# Patient Record
Sex: Male | Born: 1975 | Race: White | Hispanic: No | Marital: Single | Smoking: Current every day smoker
Health system: Southern US, Community
[De-identification: ages and names within clinical notes are randomized; demographics above are authoritative.]

## PROBLEM LIST (undated history)

## (undated) DIAGNOSIS — F1010 Alcohol abuse, uncomplicated: Secondary | ICD-10-CM

## (undated) DIAGNOSIS — F419 Anxiety disorder, unspecified: Secondary | ICD-10-CM

## (undated) DIAGNOSIS — E785 Hyperlipidemia, unspecified: Secondary | ICD-10-CM

## (undated) DIAGNOSIS — F32A Depression, unspecified: Secondary | ICD-10-CM

## (undated) DIAGNOSIS — G8929 Other chronic pain: Secondary | ICD-10-CM

## (undated) DIAGNOSIS — E669 Obesity, unspecified: Secondary | ICD-10-CM

## (undated) DIAGNOSIS — M1990 Unspecified osteoarthritis, unspecified site: Secondary | ICD-10-CM

## (undated) DIAGNOSIS — E119 Type 2 diabetes mellitus without complications: Secondary | ICD-10-CM

## (undated) DIAGNOSIS — K5900 Constipation, unspecified: Secondary | ICD-10-CM

## (undated) DIAGNOSIS — I10 Essential (primary) hypertension: Secondary | ICD-10-CM

## (undated) DIAGNOSIS — F319 Bipolar disorder, unspecified: Secondary | ICD-10-CM

## (undated) DIAGNOSIS — R51 Headache: Secondary | ICD-10-CM

## (undated) HISTORY — PX: NECK SURGERY: SHX720

## (undated) HISTORY — DX: Depression, unspecified: F32.A

## (undated) HISTORY — DX: Bipolar disorder, unspecified: F31.9

## (undated) HISTORY — DX: Constipation, unspecified: K59.00

## (undated) HISTORY — DX: Obesity, unspecified: E66.9

## (undated) HISTORY — DX: Headache: R51

## (undated) HISTORY — DX: Essential (primary) hypertension: I10

## (undated) HISTORY — DX: Alcohol abuse, uncomplicated: F10.10

## (undated) HISTORY — DX: Unspecified osteoarthritis, unspecified site: M19.90

## (undated) HISTORY — DX: Anxiety disorder, unspecified: F41.9

## (undated) HISTORY — DX: Hyperlipidemia, unspecified: E78.5

## (undated) HISTORY — DX: Other chronic pain: G89.29

---

## 1998-04-29 ENCOUNTER — Emergency Department (HOSPITAL_COMMUNITY): Admission: EM | Admit: 1998-04-29 | Discharge: 1998-04-29 | Payer: Self-pay | Admitting: Emergency Medicine

## 1998-06-17 ENCOUNTER — Encounter: Payer: Self-pay | Admitting: Emergency Medicine

## 1998-06-17 ENCOUNTER — Emergency Department (HOSPITAL_COMMUNITY): Admission: EM | Admit: 1998-06-17 | Discharge: 1998-06-17 | Payer: Self-pay | Admitting: Emergency Medicine

## 1998-06-21 ENCOUNTER — Emergency Department (HOSPITAL_COMMUNITY): Admission: EM | Admit: 1998-06-21 | Discharge: 1998-06-21 | Payer: Self-pay | Admitting: Emergency Medicine

## 1998-06-23 ENCOUNTER — Emergency Department (HOSPITAL_COMMUNITY): Admission: EM | Admit: 1998-06-23 | Discharge: 1998-06-23 | Payer: Self-pay | Admitting: Emergency Medicine

## 2001-04-16 ENCOUNTER — Emergency Department (HOSPITAL_COMMUNITY): Admission: EM | Admit: 2001-04-16 | Discharge: 2001-04-16 | Payer: Self-pay

## 2005-10-31 ENCOUNTER — Emergency Department: Payer: Self-pay | Admitting: General Practice

## 2006-06-30 ENCOUNTER — Ambulatory Visit: Payer: Self-pay | Admitting: Family Medicine

## 2006-06-30 LAB — CONVERTED CEMR LAB
ALT: 50 units/L — ABNORMAL HIGH (ref 0–40)
AST: 29 units/L (ref 0–37)
Albumin: 4.3 g/dL (ref 3.5–5.2)
Alkaline Phosphatase: 74 units/L (ref 39–117)
BUN: 9 mg/dL (ref 6–23)
CO2: 32 meq/L (ref 19–32)
Calcium: 10.2 mg/dL (ref 8.4–10.5)
Chloride: 101 meq/L (ref 96–112)
Creatinine, Ser: 0.9 mg/dL (ref 0.4–1.5)
GFR calc non Af Amer: 106 mL/min
Glomerular Filtration Rate, Af Am: 128 mL/min/{1.73_m2}
Glucose, Bld: 120 mg/dL — ABNORMAL HIGH (ref 70–99)
HCT: 52.9 % — ABNORMAL HIGH (ref 39.0–52.0)
Hemoglobin: 17.5 g/dL — ABNORMAL HIGH (ref 13.0–17.0)
MCHC: 33.2 g/dL (ref 30.0–36.0)
MCV: 98.9 fL (ref 78.0–100.0)
Platelets: 176 10*3/uL (ref 150–400)
Potassium: 5 meq/L (ref 3.5–5.1)
RBC: 5.35 M/uL (ref 4.22–5.81)
RDW: 12.1 % (ref 11.5–14.6)
Sodium: 140 meq/L (ref 135–145)
TSH: 1.87 microintl units/mL (ref 0.35–5.50)
Total Bilirubin: 0.6 mg/dL (ref 0.3–1.2)
Total Protein: 7.8 g/dL (ref 6.0–8.3)
WBC: 6.8 10*3/uL (ref 4.5–10.5)

## 2006-07-30 ENCOUNTER — Ambulatory Visit: Payer: Self-pay | Admitting: Family Medicine

## 2007-01-05 ENCOUNTER — Emergency Department (HOSPITAL_COMMUNITY): Admission: EM | Admit: 2007-01-05 | Discharge: 2007-01-05 | Payer: Self-pay | Admitting: Emergency Medicine

## 2009-09-22 ENCOUNTER — Telehealth: Payer: Self-pay | Admitting: Family Medicine

## 2009-11-20 ENCOUNTER — Ambulatory Visit: Payer: Self-pay | Admitting: Family Medicine

## 2009-11-20 DIAGNOSIS — R519 Headache, unspecified: Secondary | ICD-10-CM | POA: Insufficient documentation

## 2009-11-20 DIAGNOSIS — R51 Headache: Secondary | ICD-10-CM

## 2009-11-20 DIAGNOSIS — F1010 Alcohol abuse, uncomplicated: Secondary | ICD-10-CM | POA: Insufficient documentation

## 2009-11-20 DIAGNOSIS — F329 Major depressive disorder, single episode, unspecified: Secondary | ICD-10-CM

## 2009-11-20 DIAGNOSIS — L708 Other acne: Secondary | ICD-10-CM | POA: Insufficient documentation

## 2009-11-20 DIAGNOSIS — I10 Essential (primary) hypertension: Secondary | ICD-10-CM | POA: Insufficient documentation

## 2009-11-20 LAB — CONVERTED CEMR LAB
ALT: 42 units/L (ref 0–53)
AST: 33 units/L (ref 0–37)
Albumin: 4.1 g/dL (ref 3.5–5.2)
Alkaline Phosphatase: 123 units/L — ABNORMAL HIGH (ref 39–117)
BUN: 11 mg/dL (ref 6–23)
Basophils Absolute: 0 10*3/uL (ref 0.0–0.1)
Basophils Relative: 0.3 % (ref 0.0–3.0)
Bilirubin, Direct: 0 mg/dL (ref 0.0–0.3)
CO2: 26 meq/L (ref 19–32)
Calcium: 9.9 mg/dL (ref 8.4–10.5)
Chloride: 99 meq/L (ref 96–112)
Creatinine, Ser: 0.8 mg/dL (ref 0.4–1.5)
Eosinophils Absolute: 0.2 10*3/uL (ref 0.0–0.7)
Eosinophils Relative: 2 % (ref 0.0–5.0)
GFR calc non Af Amer: 118.71 mL/min (ref >60)
Glucose, Bld: 293 mg/dL — ABNORMAL HIGH (ref 70–99)
HCT: 53.3 % — ABNORMAL HIGH (ref 39.0–52.0)
Hemoglobin: 18.3 g/dL — ABNORMAL HIGH (ref 13.0–17.0)
Lymphocytes Relative: 20.8 % (ref 12.0–46.0)
Lymphs Abs: 1.8 10*3/uL (ref 0.7–4.0)
MCHC: 34.2 g/dL (ref 30.0–36.0)
MCV: 100.9 fL — ABNORMAL HIGH (ref 78.0–100.0)
Monocytes Absolute: 0.6 10*3/uL (ref 0.1–1.0)
Monocytes Relative: 7 % (ref 3.0–12.0)
Neutro Abs: 6 10*3/uL (ref 1.4–7.7)
Neutrophils Relative %: 69.9 % (ref 43.0–77.0)
Platelets: 186 10*3/uL (ref 150.0–400.0)
Potassium: 4.2 meq/L (ref 3.5–5.1)
RBC: 5.29 M/uL (ref 4.22–5.81)
RDW: 12.2 % (ref 11.5–14.6)
Sodium: 134 meq/L — ABNORMAL LOW (ref 135–145)
TSH: 2.05 microintl units/mL (ref 0.35–5.50)
Total Bilirubin: 0.4 mg/dL (ref 0.3–1.2)
Total Protein: 8.3 g/dL (ref 6.0–8.3)
WBC: 8.6 10*3/uL (ref 4.5–10.5)

## 2009-11-21 ENCOUNTER — Telehealth (INDEPENDENT_AMBULATORY_CARE_PROVIDER_SITE_OTHER): Payer: Self-pay

## 2009-11-22 LAB — CONVERTED CEMR LAB: Hgb A1c MFr Bld: 7.3 % — ABNORMAL HIGH

## 2009-12-19 ENCOUNTER — Ambulatory Visit: Payer: Self-pay | Admitting: Family Medicine

## 2009-12-22 ENCOUNTER — Ambulatory Visit: Payer: Self-pay | Admitting: Family Medicine

## 2009-12-22 ENCOUNTER — Telehealth (INDEPENDENT_AMBULATORY_CARE_PROVIDER_SITE_OTHER): Payer: Self-pay | Admitting: *Deleted

## 2010-09-25 NOTE — Assessment & Plan Note (Signed)
Summary: PT TO RE-EST/CJR   Vital Signs:  Patient profile:   35 year old male Height:      70 inches Weight:      269 pounds BMI:     38.74 Temp:     98.4 degrees F oral Pulse rate:   108 / minute Pulse rhythm:   regular BP sitting:   160 / 110  (left arm) Cuff size:   large  Vitals Entered By: Raechel Ache, RN (November 20, 2009 1:47 PM) CC: Re-establish. C/o LBP which radiates down L leg.   History of Present Illness: 35 yr old male to re-establish with [place] after an absence of 3 1/2 years. He is here with his father. He has been seeing the [facility] for years for depression, and they have been saying he needs blood work because he he has a hx of elevated liver enzymes. In fact the one time we checked these in 2007 his ALT was up slightly at 50, and his AST was normal at 29. Also we had started him on meds for HTN 3 years ago, but he never followed up with [place]. he says his BP has been consistently high at the [facility] also. In general he feels fine, no chest pains or SOB. He has used alcohol heavily over the years, and when I asked him about this today he says he has cut back dramatically over the past month. he has cut back to averaging 4 beers a day from averaging 12 beers a day one month ago.   Preventive Screening-Counseling & Management  Alcohol-Tobacco     Smoking Status: current     Packs/Day: 1.0  Caffeine-Diet-Exercise     Does Patient Exercise: no      Drug Use:  no.    Allergies (verified): No Known Drug Allergies  Past History:  Past Medical History: bipolar disorder Headache Hypertension  Past Surgical History: Denies surgical history  Family History: Reviewed history and no changes required. Family History Hypertension Family History Lung cancer Family History Depression  Social History: Reviewed history and no changes required. Occupation: Personnel officer Single Current Smoker Alcohol use-yes Drug use-no Regular exercise-no Smoking  Status:  current Packs/Day:  1.0 Occupation:  employed Drug Use:  no Does Patient Exercise:  no  Review of Systems  The patient denies anorexia, fever, weight loss, weight gain, vision loss, decreased hearing, hoarseness, chest pain, syncope, dyspnea on exertion, peripheral edema, prolonged cough, headaches, hemoptysis, abdominal pain, melena, hematochezia, severe indigestion/heartburn, hematuria, incontinence, genital sores, muscle weakness, suspicious skin lesions, transient blindness, difficulty walking, unusual weight change, abnormal bleeding, enlarged lymph nodes, angioedema, breast masses, and testicular masses.    Physical Exam  General:  overweight-appearing.   Head:  Normocephalic and atraumatic without obvious abnormalities. No apparent alopecia or balding. Eyes:  No corneal or conjunctival inflammation noted. EOMI. Perrla. Funduscopic exam benign, without hemorrhages, exudates or papilledema. Vision grossly normal. Ears:  External ear exam shows no significant lesions or deformities.  Otoscopic examination reveals clear canals, tympanic membranes are intact bilaterally without bulging, retraction, inflammation or discharge. Hearing is grossly normal bilaterally. Nose:  External nasal examination shows no deformity or inflammation. Nasal mucosa are pink and moist without lesions or exudates. Mouth:  Oral mucosa and oropharynx without lesions or exudates.  Teeth in good repair. Neck:  No deformities, masses, or tenderness noted. Lungs:  Normal respiratory effort, chest expands symmetrically. Lungs are clear to auscultation, no crackles or wheezes. Heart:  Normal rate and regular  rhythm. S1 and S2 normal without gallop, murmur, click, rub or other extra sounds. Psych:  Oriented X3, memory intact for recent and remote, normally interactive, good eye contact, and subdued.     Impression & Recommendations:  Problem # 1:  HYPERTENSION (ICD-401.9)  His updated medication list for this  problem includes:    Metoprolol Succinate 50 Mg Xr24h-tab (Metoprolol succinate) ..... Once daily  Orders: UA Dipstick w/o Micro (automated)  (81003) Venipuncture (16109) TLB-BMP (Basic Metabolic Panel-BMET) (80048-METABOL) TLB-CBC Platelet - w/Differential (85025-CBCD) TLB-Hepatic/Liver Function Pnl (80076-HEPATIC) TLB-TSH (Thyroid Stimulating Hormone) (84443-TSH)  Problem # 2:  DEPRESSION (ICD-311)  His updated medication list for this problem includes:    Cymbalta 60 Mg Cpep (Duloxetine hcl) .Marland Kitchen... 1 once daily  Problem # 3:  ACNE, CYSTIC (ICD-706.1)  His updated medication list for this problem includes:    Tetracycline Hcl 500 Mg Caps (Tetracycline hcl) .Marland Kitchen... 1 two times a day  Problem # 4:  HEADACHE (ICD-784.0)  His updated medication list for this problem includes:    Metoprolol Succinate 50 Mg Xr24h-tab (Metoprolol succinate) ..... Once daily  Complete Medication List: 1)  Tetracycline Hcl 500 Mg Caps (Tetracycline hcl) .Marland Kitchen.. 1 two times a day 2)  Zyprexa 5 Mg Tabs (Olanzapine) .Marland Kitchen.. 1 once daily 3)  Cymbalta 60 Mg Cpep (Duloxetine hcl) .Marland Kitchen.. 1 once daily 4)  Metoprolol Succinate 50 Mg Xr24h-tab (Metoprolol succinate) .... Once daily  Patient Instructions: 1)  It is important that you exercise reguarly at least 20 minutes 5 times a week. If you develop chest pain, have severe difficulty breathing, or feel very tired, stop exercising immediately and seek medical attention.  2)  You need to lose weight. Consider a lower calorie diet and regular exercise.  3)  We spoke at length about the effects of heavy alcohol use on his body, and I advised him to stop completely. 4)  We will check labs today, and if his liver enzymes are elevated, we can relay this to his psychiatrists.  5)  Start treating the HTN as above.  6)  Please schedule a follow-up appointment in 1 month.  Prescriptions: METOPROLOL SUCCINATE 50 MG XR24H-TAB (METOPROLOL SUCCINATE) once daily  #30 x 5   Entered and  Authorized by:   Nelwyn Salisbury MD   Signed by:   Nelwyn Salisbury MD on 11/20/2009   Method used:   Electronically to        [facility] [phone][facility] (retail)       [address] [place], [place]  [address]       Ph: [phone] or [phone]       Fax: [phone]   RxID:   [id number]   Appended Document: PT TO RE-EST/CJR  Laboratory Results   Urine Tests    Routine Urinalysis   Color: yellow Appearance: Clear Glucose: 2+   (Normal Range: Negative) Bilirubin: negative   (Normal Range: Negative) Ketone: negative   (Normal Range: Negative) Spec. Gravity: 1.020   (Normal Range: 1.003-1.035) Blood: negative   (Normal Range: Negative) Protein: 1+   (Normal Range: Negative) Urobilinogen: 0.2   (Normal Range: 0-1) Nitrite: negative   (Normal Range: Negative) Leukocyte Esterace: negative   (Normal Range: Negative)    Comments: Rita Ohara  November 20, 2009 3:54 PM

## 2010-09-25 NOTE — Progress Notes (Signed)
----   Converted from flag ---- ---- 11/21/2009 8:58 AM, Rita Ohara wrote: DONE.  ---- 11/21/2009 8:26 AM, Raechel Ache, RN wrote: please add A1C thanks ------------------------------

## 2010-09-25 NOTE — Progress Notes (Signed)
Summary: no show  Phone Note Other Incoming   Summary of Call: no show for appt. Initial call taken by: Raechel Ache, RN,  December 22, 2009 9:11 AM  Follow-up for Phone Call        charge the NS fee Follow-up by: Nelwyn Salisbury MD,  December 22, 2009 4:06 PM  Additional Follow-up for Phone Call Additional follow up Details #1::        Billed no show.[name] [name]  Jan 15, 2010 9:22 AM

## 2010-09-25 NOTE — Progress Notes (Signed)
Summary: migraine ha/not seen since 07-30-2006  Phone Note Call from Patient Call back at Home Phone [phone] Call back at [phone]   Caller: Dad-wayne Call For: dr Bleu Minerd Summary of Call: pt has not seen  dr Tykeria Wawrzyniak since 07-30-2006 he is having migraine type ha can i use sda 09-25-2008 pt works out of town [employer]. Initial call taken by: Heron Sabins,  September 22, 2009 3:43 PM  Follow-up for Phone Call        No do not put him in on Monday, this is a busy hectic day already. This is a difficult and complicated pt. who will take a lot of time. One day later in the week would be okay Follow-up by: Nelwyn Salisbury MD,  September 22, 2009 5:20 PM  Additional Follow-up for Phone Call Additional follow up Details #1::        thanks pt is sch for 09-27-2009 3.15pm Additional Follow-up by: Heron Sabins,  September 22, 2009 5:33 PM

## 2011-01-11 NOTE — Assessment & Plan Note (Signed)
[facility]                                 ON-CALL NOTE   [name] [name], [name] [name]                         MRN:          [id number]  DATE:09/24/2006                            DOB:          [birth year]    Phone number [phone].   Father calls.  Patient of Dr. Claris Che, is on Cymbalta 120 mg a day.  Because of financial considerations with no insurance, they have run  out, usually gets samples.  A prescription was supposed to be called  into [facility], but [facility] is now closed and he has been off of his medicine  1 day and does not feel very well, and could we call in some medicine.  I have called in #10 Cymbalta 60 mg take 2 p.o. q. day [facility] [address] in [place], [phone].  The will call or follow up with  Dr. Clent Ridges tomorrow about more medication.     Neta Mends. Panosh, MD  Electronically Signed    WKP/MedQ  DD: 09/25/2006  DT: 09/25/2006  Job #: [id number]

## 2011-01-11 NOTE — Assessment & Plan Note (Signed)
[facility] OFFICE NOTE   [name] [name], [name] [name]                   MRN:          [id number]  DATE:06/30/2006                            DOB:          Aug 14, 1976    This is a 35 year old gentleman here to establish with our practice.  He is  accompanied by his father who is already a patient of mine.  [name] [name] has not  had a primary care physician in quite a number of years.  He is employed but  has no insurance, so this has limited his medical followup in the past.  He  has been seeing [facility] for some years for treatment  of depression but feels that his current regimen is no longer working.  About 8-9 years ago, he was placed on Paxil for depression, and it never  really helped him much.  Then about 7 years ago, he was placed on Effexor.  At first, this seemed to help, but over the years, his dose has gradually  been increased more and more to the point that he is now taking a very high  dose but still having a lot of difficulties.  He states that there are times  that he gets very depressed, very sad, and often will disappear and have no  social interaction with his family for weeks at a time.  He does deny  suicidal ideations fortunately.  There are other times when he becomes very  irritable.  He cannot sit still. He has trouble sleeping.  He states that he  has a problem with managing his anger, and his father corroborates this  entire history.  One problem with such a high dose of Effexor is that if he  misses a single day's dose, he feels very bad with withdrawal types of  symptoms.  He does typically have trouble sleeping at night.  He also has  had frequent headaches and a couple of nose bleeds over the past few months.  He thinks his blood pressure is creeping up.  He does have a family history  of it.   OTHER PAST MEDICAL HISTORY:  Fairly unremarkable.  No significant  illnesses.  He has never had a surgery.   ALLERGIES:  None.   CURRENT MEDICATIONS:  Effexor, a total of 375 mg per day.  (He takes five 75  mg capsules a day).   HABITS:  He smokes one pack per day of cigarettes.  He also drinks alcohol  fairly frequently.  He averages drinking beers 2-3 days a week, and when he  does drink, he typically has anywhere from 4-10 beers at a time.  He denies  any illicit drugs.   SOCIAL HISTORY:  He is single.  He is an Personnel officer.   FAMILY HISTORY:  Remarkable for depression, diabetes, and hypertension.   OBJECTIVE:  VITAL SIGNS:  Height 5 feet 10 inches, weight 258.  Blood  pressure 152/104, pulse 80 and regular.  GENERAL:  He is somewhat overweight.  NECK:  Supple without lymphadenopathy or masses.  LUNGS: Clear.  CARDIAC:  Regular rate and rhythm without gallops, murmurs, or rubs.  EXTREMITIES: No edema.  NEUROLOGIC: He does seem somewhat anxious today, but it is appropriate.   ASSESSMENT AND PLAN:  1. Probable bipolar disorder.  Will switch from Effexor to Cymbalta and      begin at 90 mg per day.  I gave him samples of 30 mg pills and 60 mg      pills to take over the next month. As a mood stabilizer, will add      Tegretol 200 mg to take 2 tablets per day.  I do anticipate titrating      the dose of this up over time, however.  Asked to see him back in 3      weeks for a followup visit.  We will stop the Effexor completely and      switch to these 2 new medications as of today.  2. Hypertension.  I gave him samples to begin AZOR 5/20 to take once a      day.  Will check lab work today and will follow up his blood pressure      in 3 weeks as well.    ______________________________  Tera Mater. Clent Ridges, MD    SAF/MedQ  DD: 06/30/2006  DT: 06/30/2006  Job #: [id number]

## 2011-02-22 ENCOUNTER — Emergency Department: Payer: Self-pay | Admitting: Emergency Medicine

## 2011-03-03 ENCOUNTER — Emergency Department (HOSPITAL_COMMUNITY)
Admission: EM | Admit: 2011-03-03 | Discharge: 2011-03-03 | Disposition: A | Discharge status: Home / Self Care | Payer: Self-pay | Attending: Emergency Medicine | Admitting: Emergency Medicine

## 2011-03-03 ENCOUNTER — Emergency Department (HOSPITAL_COMMUNITY): Payer: Self-pay

## 2011-03-03 DIAGNOSIS — S81009A Unspecified open wound, unspecified knee, initial encounter: Secondary | ICD-10-CM | POA: Insufficient documentation

## 2011-03-03 DIAGNOSIS — X58XXXA Exposure to other specified factors, initial encounter: Secondary | ICD-10-CM | POA: Insufficient documentation

## 2011-03-03 DIAGNOSIS — M542 Cervicalgia: Secondary | ICD-10-CM | POA: Insufficient documentation

## 2011-03-03 DIAGNOSIS — S91009A Unspecified open wound, unspecified ankle, initial encounter: Secondary | ICD-10-CM | POA: Insufficient documentation

## 2011-03-03 DIAGNOSIS — R51 Headache: Secondary | ICD-10-CM | POA: Insufficient documentation

## 2011-03-03 DIAGNOSIS — R509 Fever, unspecified: Secondary | ICD-10-CM | POA: Insufficient documentation

## 2011-03-03 DIAGNOSIS — F411 Generalized anxiety disorder: Secondary | ICD-10-CM | POA: Insufficient documentation

## 2011-03-04 ENCOUNTER — Telehealth: Payer: Self-pay | Admitting: *Deleted

## 2011-03-04 NOTE — Telephone Encounter (Signed)
[facility] Triage Call Report Triage Record Num: [id number] Operator: Thayer Headings Patient Name: Coulter Oldaker Call Date & Time: 03/03/2011 2:35:54PM Patient Phone: [phone] PCP: Patient Gender: Male PCP Fax : Patient DOB: 1975-10-18 Practice Name: [facility] Reason for Call: Pain in the left arm with fever and vomiting and the caller, dad states the patient had a spider bite 2 weeks ago with has caused him to be bed ridden. No triage. Dad said the only reason he was calling was so Dr. Abran Cantor could meet him at the ED. Advised caller that the pt would need to be seen in ED and the ED doctor would call Dr. Abran Cantor if he needed to, asked Dad he if would like for the pt to be triaged to see if he needs to go to ED. Dad refused. THE PATIENT REFUSED 911 Protocol(s) Used: Office Note Recommended Outcome per Protocol: Information Noted and Sent to Office Reason for Outcome: Caller information to office Care Advice: ~ 03/03/2011 2:42:15PM Page 1 of 1 [email or web address]

## 2011-03-04 NOTE — Telephone Encounter (Signed)
Left a voice message, I was calling to follow up to find out if pt did get treatment. I explained that the doctor is out of town and will be out of the office all week.Marland Kitchen

## 2011-10-03 ENCOUNTER — Encounter (HOSPITAL_COMMUNITY): Payer: Self-pay | Admitting: Emergency Medicine

## 2011-10-03 ENCOUNTER — Emergency Department (HOSPITAL_COMMUNITY)
Admission: EM | Admit: 2011-10-03 | Discharge: 2011-10-03 | Disposition: A | Discharge status: Home / Self Care | Payer: Self-pay | Attending: Emergency Medicine | Admitting: Emergency Medicine

## 2011-10-03 DIAGNOSIS — I10 Essential (primary) hypertension: Secondary | ICD-10-CM | POA: Insufficient documentation

## 2011-10-03 DIAGNOSIS — R7309 Other abnormal glucose: Secondary | ICD-10-CM | POA: Insufficient documentation

## 2011-10-03 DIAGNOSIS — R350 Frequency of micturition: Secondary | ICD-10-CM | POA: Insufficient documentation

## 2011-10-03 DIAGNOSIS — R739 Hyperglycemia, unspecified: Secondary | ICD-10-CM

## 2011-10-03 DIAGNOSIS — R209 Unspecified disturbances of skin sensation: Secondary | ICD-10-CM | POA: Insufficient documentation

## 2011-10-03 LAB — URINALYSIS, ROUTINE W REFLEX MICROSCOPIC
Leukocytes, UA: NEGATIVE
Nitrite: NEGATIVE
Specific Gravity, Urine: 1.029 (ref 1.005–1.030)
Urobilinogen, UA: 1 mg/dL (ref 0.0–1.0)
pH: 6.5 (ref 5.0–8.0)

## 2011-10-03 LAB — CBC
Hemoglobin: 18.9 g/dL — ABNORMAL HIGH (ref 13.0–17.0)
Platelets: 130 10*3/uL — ABNORMAL LOW (ref 150–400)
RBC: 5.33 MIL/uL (ref 4.22–5.81)
WBC: 7.8 10*3/uL (ref 4.0–10.5)

## 2011-10-03 LAB — DIFFERENTIAL
Lymphocytes Relative: 20 % (ref 12–46)
Lymphs Abs: 1.5 10*3/uL (ref 0.7–4.0)
Monocytes Relative: 9 % (ref 3–12)
Neutrophils Relative %: 69 % (ref 43–77)

## 2011-10-03 LAB — BASIC METABOLIC PANEL
CO2: 26 mEq/L (ref 19–32)
Calcium: 9.9 mg/dL (ref 8.4–10.5)
GFR calc non Af Amer: >90 mL/min (ref >90)
Glucose, Bld: 273 mg/dL — ABNORMAL HIGH (ref 70–99)
Potassium: 4.3 mEq/L (ref 3.5–5.1)
Sodium: 134 mEq/L — ABNORMAL LOW (ref 135–145)

## 2011-10-03 LAB — GLUCOSE, CAPILLARY: Glucose-Capillary: 243 mg/dL — ABNORMAL HIGH (ref 70–99)

## 2011-10-03 LAB — HEMOGLOBIN A1C
Hgb A1c MFr Bld: 8.5 % — ABNORMAL HIGH (ref <5.7)
Mean Plasma Glucose: 197 mg/dL — ABNORMAL HIGH (ref <117)

## 2011-10-03 LAB — URINE MICROSCOPIC-ADD ON

## 2011-10-03 MED ORDER — SODIUM CHLORIDE 0.9 % IV BOLUS (SEPSIS)
1000.0000 mL | Freq: Once | INTRAVENOUS | Status: AC
  Administered 2011-10-03: 1000 mL via INTRAVENOUS

## 2011-10-03 NOTE — ED Notes (Signed)
Pt c/o blurry vision, hand numbness and increased thirst x several weeks; pt sts bought CBG machine and has elevated CBG around 225 or higher; pt with no hx of DM

## 2011-10-03 NOTE — ED Notes (Signed)
CBG 243 

## 2011-10-03 NOTE — ED Provider Notes (Signed)
History     CSN: [id number]  Arrival date & time 10/03/11  1009   First MD Initiated Contact with Patient 10/03/11 1100      Chief Complaint  Patient presents with  . Hyperglycemia    (Consider location/radiation/quality/duration/timing/severity/associated sxs/prior treatment) HPI Comments: Patient presents emergency department with chief complaint of hyperglycemia.  Patient states that he has a home glucometer and has been checking his blood sugars finding and they have been greater than 200 every time.  Patient states in addition to this he been having some tingling of his hands and feet.  Patient is a current smoker.  Patient reports increasing urinary frequency, polydipsia, mildly blurred vision.  Patient denies nausea, vomiting, diarrhea, abdominal pain, extremity weakness or swelling, chest pain, shortness of breath.  The history is provided by the patient.    History reviewed. No pertinent past medical history.  History reviewed. No pertinent past surgical history.  History reviewed. No pertinent family history.  History  Substance Use Topics  . Smoking status: Current Everyday Smoker  . Smokeless tobacco: Not on file  . Alcohol Use: Yes      Review of Systems  Constitutional: Negative for fever, chills and appetite change.  HENT: Negative for congestion.   Eyes: Negative for visual disturbance.  Respiratory: Negative for shortness of breath.   Cardiovascular: Negative for chest pain and leg swelling.  Gastrointestinal: Negative for abdominal pain.  Genitourinary: Positive for frequency. Negative for dysuria, hematuria, flank pain, decreased urine volume, enuresis, difficulty urinating and penile pain.  Musculoskeletal: Negative for back pain.  Neurological: Negative for dizziness, syncope, weakness, light-headedness, numbness and headaches.  Psychiatric/Behavioral: Negative for confusion.  All other systems reviewed and are negative.    Allergies  Review of  patient's allergies indicates no known allergies.  Home Medications   Current Outpatient Rx  Name Route Sig Dispense Refill  . ACETAMINOPHEN 325 MG PO TABS Oral Take 650 mg by mouth every 6 (six) hours as needed. headaches    . ASPIRIN 325 MG PO TABS Oral Take 650 mg by mouth daily as needed. Head aches    . DULOXETINE HCL 30 MG PO CPEP Oral Take 30 mg by mouth 2 (two) times daily.      BP 136/75  Pulse 72  Temp(Src) 98 F (36.7 C) (Oral)  Resp 18  SpO2 99%  Physical Exam  Nursing note and vitals reviewed. Constitutional: He is oriented to person, place, and time. He appears well-developed and well-nourished. No distress.  HENT:  Head: Normocephalic and atraumatic.  Eyes: Conjunctivae and EOM are normal. Pupils are equal, round, and reactive to light. No scleral icterus.       Visual acuity 20/20 bilaterally via nurse.   Neck: Normal range of motion and full passive range of motion without pain. Neck supple. No JVD present. Carotid bruit is not present. No rigidity. No Brudzinski's sign noted.  Cardiovascular: Normal rate, regular rhythm, normal heart sounds and intact distal pulses.   Pulmonary/Chest: Effort normal and breath sounds normal. No respiratory distress. He has no wheezes. He has no rales.  Abdominal: Soft. Normal appearance and normal aorta.       No abdominal or CVA ttp. BS present and normal.   Musculoskeletal: Normal range of motion.  Lymphadenopathy:    He has no cervical adenopathy.  Neurological: He is alert and oriented to person, place, and time. He has normal strength. No cranial nerve deficit or sensory deficit. He displays a negative Romberg sign. Coordination  and gait normal. GCS eye subscore is 4. GCS verbal subscore is 5. GCS motor subscore is 6.       A&O x3.  PERRL, EOMs, no vertical or bidirectional nystagmus. Shoulder shrug, facial muscles, tongue protrusion and swallow intact.  Motor strength 5/5 bilaterally of extremities. Normal patellar DTRs.   Light touch intact in all 4 distal limbs. No ataxia or dysequilibrium.   Skin: Skin is warm and dry. No rash noted. He is not diaphoretic.  Psychiatric: He has a normal mood and affect. His behavior is normal.    ED Course  Procedures (including critical care time)  Labs Reviewed  GLUCOSE, CAPILLARY - Abnormal; Notable for the following:    Glucose-Capillary 243 (*)    All other components within normal limits  BASIC METABOLIC PANEL - Abnormal; Notable for the following:    Sodium 134 (*)    Glucose, Bld 273 (*)    All other components within normal limits  CBC - Abnormal; Notable for the following:    Hemoglobin 18.9 (*)    HCT 53.3 (*)    MCH 35.5 (*)    Platelets 130 (*)    All other components within normal limits  DIFFERENTIAL  HEMOGLOBIN A1C  URINALYSIS, ROUTINE W REFLEX MICROSCOPIC   No results found.   No diagnosis found.    MDM  Hyperglycemia/ Hypertension  Pt with mild ketonuria and BG of 273, however non concerning for DKA do to normal bicarb, no anion gap and the absence of GI s/s, muscle aches and respiratory distress.           Jaci Carrel, [place] 10/07/11 2354

## 2011-10-04 ENCOUNTER — Encounter: Payer: Self-pay | Admitting: Family Medicine

## 2011-10-04 ENCOUNTER — Ambulatory Visit: Payer: Self-pay | Admitting: Family Medicine

## 2011-10-04 ENCOUNTER — Ambulatory Visit (INDEPENDENT_AMBULATORY_CARE_PROVIDER_SITE_OTHER): Payer: Self-pay | Admitting: Family Medicine

## 2011-10-04 VITALS — BP 178/110 | HR 114 | Temp 98.5°F | Wt 233.0 lb

## 2011-10-04 DIAGNOSIS — R739 Hyperglycemia, unspecified: Secondary | ICD-10-CM

## 2011-10-04 DIAGNOSIS — R7309 Other abnormal glucose: Secondary | ICD-10-CM

## 2011-10-04 DIAGNOSIS — I10 Essential (primary) hypertension: Secondary | ICD-10-CM

## 2011-10-04 DIAGNOSIS — E119 Type 2 diabetes mellitus without complications: Secondary | ICD-10-CM

## 2011-10-04 MED ORDER — METFORMIN HCL 500 MG PO TABS: 500.0000 mg | ORAL_TABLET | Freq: Two times a day (BID) | ORAL | Status: DC

## 2011-10-04 MED ORDER — LISINOPRIL 10 MG PO TABS: 10.0000 mg | ORAL_TABLET | Freq: Every day | ORAL | Status: DC

## 2011-10-04 NOTE — Patient Instructions (Signed)

## 2011-10-04 NOTE — Progress Notes (Signed)
  Subjective:    Patient ID: [name] [name], male    DOB: 08/13/1976, 36 y.o.   MRN: [id number]  HPI  Patient here to evaluate elevated blood pressure and elevated blood sugar. No prior history of diabetes. He has recently developed some increased urine frequency and thirst along with mild weight loss. Also had some mild blurred vision and tingling feet and hands. Went to the emergency department with elevated blood sugar 220 yesterday. Patient had acquired a home glucose monitor and several readings between 225 and 331. Strong family history type 2 diabetes. No evidence for ketoacidosis based on recent hospital labs. Poor dietary compliance. No consistent exercise.  Hemoglobin A 1C 7.3% one year ago and 8.5% in emergency department.  Very high blood pressure in hospital. Patient has had generalized malaise for quite some time. Binges with alcohol sometimes up to 18 beers per day.  Was treated for hypertension several years ago with some medication which he did not tolerate. No allergic reaction but has felt more fatigued. Does not recall name of medication.   Review of Systems  Constitutional: Positive for unexpected weight change (weight loss). Negative for fatigue.  Eyes: Negative for visual disturbance.  Respiratory: Negative for cough, chest tightness and shortness of breath.   Cardiovascular: Negative for chest pain, palpitations and leg swelling.  Genitourinary: Positive for frequency.  Neurological: Negative for dizziness, syncope, weakness, light-headedness and headaches.       Objective:   Physical Exam  Constitutional: He is oriented to person, place, and time. He appears well-developed and well-nourished.  HENT:  Right Ear: External ear normal.  Left Ear: External ear normal.  Mouth/Throat: Oropharynx is clear and moist.  Neck: Neck supple. No thyromegaly present.  Cardiovascular: Normal rate and regular rhythm.   Pulmonary/Chest: Effort normal and breath sounds normal. No  respiratory distress. He has no wheezes. He has no rales.  Musculoskeletal: He exhibits no edema.  Lymphadenopathy:    He has no cervical adenopathy.  Neurological: He is alert and oriented to person, place, and time.  Psychiatric: He has a normal mood and affect. His behavior is normal.          Assessment & Plan:  #1 type 2 diabetes.   New onset and symptomatic. Start metformin 500 mg daily and if no side effects after one week titrate to 500 mg twice a day. Dietary factors discussed. Discussed diabetes educator but he has no insurance. Continue home glucose monitoring. Dietary information given #2 hypertension. Severe elevation and untreated. Scale back alcohol use. Start lisinopril 10 mg daily. Reassess with primary 3-4 weeks #3 alcohol abuse. Discussed importance of giving this up altogether if unable to reduce consumption.

## 2011-10-08 NOTE — ED Provider Notes (Signed)
Medical screening examination/treatment/procedure(s) were conducted as a shared visit with non-physician practitioner(s) and myself.  I personally evaluated the patient during the encounter  Ethelda Chick, MD 10/08/11 [phone]

## 2011-10-11 ENCOUNTER — Telehealth: Payer: Self-pay | Admitting: *Deleted

## 2011-10-11 NOTE — Telephone Encounter (Signed)
Try anti-inflammatories such as Aleve or ibuprofen. If that is not working he needs to be evaluated

## 2011-10-11 NOTE — Telephone Encounter (Signed)
Pt feels he is having a reaction to the Metformin as pain in muscles especiallly his neck, shoulders and arms.  No Tylenol or heat or ice is helping. Asking for Dr. Lucie Leather advice.

## 2011-10-11 NOTE — Telephone Encounter (Signed)
This is unlikely to be secondary to metformin.  I would try to stay the course unless he has severe pain.  If pain persists he would have to discontinue the metformin briefly to see if pain went away.

## 2011-10-11 NOTE — Telephone Encounter (Signed)
Pt is stating it is severe pain,and nothing he is using is helping????

## 2011-10-11 NOTE — Telephone Encounter (Signed)
Notified pt. 

## 2011-10-14 ENCOUNTER — Telehealth: Payer: Self-pay | Admitting: *Deleted

## 2011-10-14 NOTE — Telephone Encounter (Signed)
Pt is still having severe neck and shoulder pain, and is extremely nauseated.  Would like to go off the Metformin.

## 2011-10-14 NOTE — Telephone Encounter (Signed)
Stop metformin. Patient needs followup with Dr. Clent Ridges

## 2011-11-01 ENCOUNTER — Ambulatory Visit (INDEPENDENT_AMBULATORY_CARE_PROVIDER_SITE_OTHER): Payer: Self-pay | Admitting: Family Medicine

## 2011-11-01 ENCOUNTER — Encounter: Payer: Self-pay | Admitting: Family Medicine

## 2011-11-01 VITALS — BP 148/90 | HR 94 | Temp 98.2°F | Wt 228.0 lb

## 2011-11-01 DIAGNOSIS — I10 Essential (primary) hypertension: Secondary | ICD-10-CM

## 2011-11-01 DIAGNOSIS — E119 Type 2 diabetes mellitus without complications: Secondary | ICD-10-CM

## 2011-11-01 MED ORDER — GLIPIZIDE 10 MG PO TABS: 10.0000 mg | ORAL_TABLET | Freq: Two times a day (BID) | ORAL | Status: DC

## 2011-11-01 MED ORDER — LISINOPRIL 20 MG PO TABS: 20.0000 mg | ORAL_TABLET | Freq: Every day | ORAL | Status: DC

## 2011-11-01 NOTE — Progress Notes (Signed)
  Subjective:    Patient ID: [name] [name], male    DOB: 1976-03-25, 36 y.o.   MRN: [id number]  HPI Here to follow up on HTN and diabetes. He stopped taking Metformin due to nausea and muscle aches. He has made some dietary changes and cut back quite a bit on his intake of sugars and starches. He has cut back slightly on his alcohol use (from drinking 18 beers at a time 3-4 days a week to drinking 12 beers at a time). He is taking the Lisinopril. He feels better with more energy. His glucoses have been running from 140 to 200.    Review of Systems  Constitutional: Negative.   Respiratory: Negative.   Cardiovascular: Negative.        Objective:   Physical Exam  Constitutional: He appears well-developed and well-nourished.  Cardiovascular: Normal rate, regular rhythm, normal heart sounds and intact distal pulses.   Pulmonary/Chest: Effort normal and breath sounds normal.          Assessment & Plan:  We will start him on Glipizide bid. Increase Lisinopril dose.  Watch the diet. I urged him to continue decreasing his alcohol use. Recheck in 2 weeks.

## 2011-11-06 ENCOUNTER — Telehealth: Payer: Self-pay

## 2011-11-06 NOTE — Telephone Encounter (Signed)
Pt states he was just able to get his glipizide 10 mg that Dr. Clent Ridges just prescribed for pt.  Pt states he took the medication with a meal last night and his blood sugar dropped to 58.  Pt states he got dizzy and almost passed out.  This morning pt states his blood sugar was 160. Pt states all night his blood sugar did not go above 89. Pt just ate at noon and he states he took 1/2 tablet.  Pt would like to know what he needs to do about this.  Pls advise.

## 2011-11-06 NOTE — Telephone Encounter (Signed)
Stay with 1/2 tablet bid for now. Let us know how the glucoses are running in a week or two

## 2011-11-06 NOTE — Telephone Encounter (Signed)
Pt aware and states he will call back with glucose results.

## 2011-11-15 ENCOUNTER — Ambulatory Visit: Payer: Self-pay | Admitting: Family Medicine

## 2012-03-10 ENCOUNTER — Emergency Department: Payer: Self-pay | Admitting: Emergency Medicine

## 2012-04-25 ENCOUNTER — Emergency Department: Payer: Self-pay | Admitting: Emergency Medicine

## 2012-04-26 ENCOUNTER — Emergency Department: Payer: Self-pay | Admitting: Internal Medicine

## 2012-05-29 ENCOUNTER — Emergency Department: Payer: Self-pay | Admitting: Internal Medicine

## 2012-05-29 LAB — BASIC METABOLIC PANEL
BUN: 11 mg/dL (ref 7–18)
Calcium, Total: 9 mg/dL (ref 8.5–10.1)
EGFR (African American): >60
EGFR (Non-African Amer.): >60
Glucose: 273 mg/dL — ABNORMAL HIGH (ref 65–99)
Osmolality: 281 (ref 275–301)
Sodium: 136 mmol/L (ref 136–145)

## 2012-06-23 ENCOUNTER — Other Ambulatory Visit: Payer: Self-pay | Admitting: Family Medicine

## 2012-07-30 ENCOUNTER — Telehealth: Payer: Self-pay | Admitting: Family Medicine

## 2012-07-30 MED ORDER — LOSARTAN POTASSIUM-HCTZ 100-12.5 MG PO TABS: 1.0000 | ORAL_TABLET | Freq: Every day | ORAL | Status: DC

## 2012-07-30 NOTE — Telephone Encounter (Signed)
Per Dr. Kirtland Bouchard- highly unlikely that lisinopril is causing pt's hair to come out but a new medication will be called in since pt's bp is elevated.  Ok per to K to call in losartan 100-12.5 mg #30.  Pt is aware.

## 2012-07-30 NOTE — Telephone Encounter (Signed)
Called and spoke with pt and pt is aware Dr. Clent Ridges is out of the office until 12/6.  Pt would like to see if another physician would call in another bp medication.

## 2012-07-30 NOTE — Telephone Encounter (Signed)
Pts father called and said that his son called the [facility] and has not gotten a call back. Pt is having problems with bp and reading was 167/100. Pts hair is falling out of hair in beard, due to medication lisinopril (PRINIVIL,ZESTRIL) 20 MG tablet . Req call back from nurse asap. Pts father is aware that Dr Clent Ridges has already left for the day, but is still req call back from some one today. [facility] in [place], [place].

## 2012-07-30 NOTE — Telephone Encounter (Signed)
Patient Information:  Caller Name: Sabastian  Phone: [phone]  Patient: [name] [name], [name] [name]  Gender: Male  DOB: December 10, 1975  Age: 36 Years  PCP: N/A   Symptoms  Reason For Call & Symptoms: Patient LOV November 01, 2011.  Lisinopril, Glipizide  was started in March for elevated blood pressure and sugar.  He noticed that his facial hair has started coming out around September.  He stopped the lisinopril three days ago because he thinks its his blood pressure medication causing hair loss.   Pressure today 166/100. describes dizzy intermittently.  Reviewed Health History In EMR: Yes  Reviewed Medications In EMR: Yes  Reviewed Allergies In EMR: Yes  Reviewed Surgeries / Procedures: Yes  Date of Onset of Symptoms: 04/26/2012  Treatments Tried: Stopped Lisinopril x3 days  Treatments Tried Worked: No  Guideline(s) Used:  High Blood Pressure  Disposition Per Guideline:   See Within 2 Weeks in [facility]  Reason For Disposition Reached:   BP > 160/100  Advice Given:  BP less than 120 / 80   This is considered normal blood pressure  General:  Untreated high blood pressure may cause damage to the heart, brain, kidneys, and eyes.  Call Back If:  Headache, blurred vision, difficulty talking, or difficulty walking occurs  Chest pain or difficulty breathing occurs  You become worse.  How Much Sodium (Salt) Should You Eat Each Day?  Aim to eat less than 1,500 mg of sodium each day.  Remember that one teaspoon of salt has 2,300 mg of sodium.  Unfortunately, 75% of the salt in the average person's diet is in pre-processed foods.  Call Back If:  You have more questions.  Office Follow Up:  Does the office need to follow up with this patient?: Yes  Instructions For The Office: Patient is out of town. Does not have medication. Stopped Lisinopril due to facial hair loss. B/P 166/100  RN Note:  Patient is out of town in [place] . Does not have his lisinopril with him. Stopped medication because he  believes it is causing facial hair loss. B/P 166/100 . Pharmacy [phone]

## 2012-07-31 NOTE — Telephone Encounter (Signed)
I spoke with pt and he is going to schedule the office visit for next week.

## 2012-07-31 NOTE — Telephone Encounter (Signed)
I see a new med was called in for him. He needs to make an appt with me for next week

## 2012-08-04 ENCOUNTER — Ambulatory Visit (INDEPENDENT_AMBULATORY_CARE_PROVIDER_SITE_OTHER): Payer: Self-pay | Admitting: Family Medicine

## 2012-08-04 ENCOUNTER — Encounter: Payer: Self-pay | Admitting: Family Medicine

## 2012-08-04 VITALS — BP 142/98 | HR 85 | Temp 98.4°F | Wt 240.0 lb

## 2012-08-04 DIAGNOSIS — E119 Type 2 diabetes mellitus without complications: Secondary | ICD-10-CM

## 2012-08-04 DIAGNOSIS — I10 Essential (primary) hypertension: Secondary | ICD-10-CM

## 2012-08-04 LAB — HEPATIC FUNCTION PANEL
ALT: 32 U/L (ref 0–53)
AST: 22 U/L (ref 0–37)
Albumin: 4.1 g/dL (ref 3.5–5.2)
Alkaline Phosphatase: 89 U/L (ref 39–117)

## 2012-08-04 LAB — BASIC METABOLIC PANEL
BUN: 11 mg/dL (ref 6–23)
Calcium: 9.4 mg/dL (ref 8.4–10.5)
Chloride: 96 mEq/L (ref 96–112)
Creatinine, Ser: 0.8 mg/dL (ref 0.4–1.5)
GFR: 116.8 mL/min (ref >60.00)

## 2012-08-04 LAB — MICROALBUMIN / CREATININE URINE RATIO: Creatinine,U: 192.9 mg/dL

## 2012-08-04 LAB — LIPID PANEL
Cholesterol: 240 mg/dL — ABNORMAL HIGH (ref 0–200)
Total CHOL/HDL Ratio: 9

## 2012-08-04 MED ORDER — AMLODIPINE BESYLATE 5 MG PO TABS: 5.0000 mg | ORAL_TABLET | Freq: Every day | ORAL | Status: DC

## 2012-08-04 MED ORDER — GLYBURIDE 2.5 MG PO TABS: 2.5000 mg | ORAL_TABLET | Freq: Two times a day (BID) | ORAL | Status: DC

## 2012-08-04 NOTE — Progress Notes (Signed)
  Subjective:    Patient ID: [name] [name], male    DOB: 1976-07-04, 36 y.o.   MRN: [id number]  HPI Here to follow up HTN and DM. He had stopped Lisinopril because he thinks it was causing him to lose facial hair, and he started on Losartan HCT. He does not like this either because he feels lightheaded and dizzy, even though his BP remains quite elevated. He has had readings as high as 170/100 in the past few days. No HA or chest pain or SOB. His glucoses have been labile, jumping fropm the 60s to the 200s and back down again. He is very sensitive to Glipizide, and it causes his glucose to bottom out even if he only takes 1/4 of a 10 mg tablet. He cannot afford anything other than generic meds.    Review of Systems  Constitutional: Negative.   Respiratory: Negative.   Cardiovascular: Negative.   Neurological: Positive for dizziness and light-headedness. Negative for headaches.       Objective:   Physical Exam  Constitutional: He is oriented to person, place, and time. He appears well-developed and well-nourished.  Neck: No thyromegaly present.  Cardiovascular: Normal rate, regular rhythm, normal heart sounds and intact distal pulses.   Pulmonary/Chest: Effort normal.  Lymphadenopathy:    He has no cervical adenopathy.  Neurological: He is alert and oriented to person, place, and time.          Assessment & Plan:  Get fasting labs today. Switch to Glyburide 2.5 mg bid. Switch to Amlodipine. Recheck in 2-3 weeks

## 2012-08-10 NOTE — Progress Notes (Signed)
Quick Note:  I spoke with pt and he cannot take Metformin. He will be back here in the office in 2 weeks and will discuss new medication. ______

## 2012-08-11 ENCOUNTER — Emergency Department (HOSPITAL_COMMUNITY)
Admission: EM | Admit: 2012-08-11 | Discharge: 2012-08-11 | Disposition: A | Discharge status: Home / Self Care | Payer: Self-pay | Attending: Emergency Medicine | Admitting: Emergency Medicine

## 2012-08-11 ENCOUNTER — Encounter (HOSPITAL_COMMUNITY): Payer: Self-pay | Admitting: Emergency Medicine

## 2012-08-11 DIAGNOSIS — Z79899 Other long term (current) drug therapy: Secondary | ICD-10-CM | POA: Insufficient documentation

## 2012-08-11 DIAGNOSIS — I10 Essential (primary) hypertension: Secondary | ICD-10-CM | POA: Insufficient documentation

## 2012-08-11 DIAGNOSIS — F17200 Nicotine dependence, unspecified, uncomplicated: Secondary | ICD-10-CM | POA: Insufficient documentation

## 2012-08-11 DIAGNOSIS — F319 Bipolar disorder, unspecified: Secondary | ICD-10-CM | POA: Insufficient documentation

## 2012-08-11 DIAGNOSIS — R0789 Other chest pain: Secondary | ICD-10-CM | POA: Insufficient documentation

## 2012-08-11 DIAGNOSIS — R0602 Shortness of breath: Secondary | ICD-10-CM | POA: Insufficient documentation

## 2012-08-11 DIAGNOSIS — E119 Type 2 diabetes mellitus without complications: Secondary | ICD-10-CM | POA: Insufficient documentation

## 2012-08-11 DIAGNOSIS — F410 Panic disorder [episodic paroxysmal anxiety] without agoraphobia: Secondary | ICD-10-CM | POA: Insufficient documentation

## 2012-08-11 DIAGNOSIS — R42 Dizziness and giddiness: Secondary | ICD-10-CM | POA: Insufficient documentation

## 2012-08-11 DIAGNOSIS — R002 Palpitations: Secondary | ICD-10-CM | POA: Insufficient documentation

## 2012-08-11 HISTORY — DX: Type 2 diabetes mellitus without complications: E11.9

## 2012-08-11 LAB — POCT I-STAT, CHEM 8
Creatinine, Ser: 0.6 mg/dL (ref 0.50–1.35)
HCT: 56 % — ABNORMAL HIGH (ref 39.0–52.0)
Hemoglobin: 19 g/dL — ABNORMAL HIGH (ref 13.0–17.0)
Potassium: 4.3 mEq/L (ref 3.5–5.1)
Sodium: 138 mEq/L (ref 135–145)

## 2012-08-11 LAB — POCT I-STAT TROPONIN I

## 2012-08-11 MED ORDER — LORAZEPAM 1 MG PO TABS: 1.0000 mg | ORAL_TABLET | Freq: Three times a day (TID) | ORAL | Status: DC | PRN

## 2012-08-11 NOTE — ED Notes (Signed)
Pt states he has a history of panic attacks in high school with similar presentation. Pt states he had cp and sob and would have to leave class. Pt was seen at primary recently and is aware of situation.

## 2012-08-11 NOTE — ED Notes (Signed)
Pt complains of sudden dizziness, chest pain and sob for 2 weeks. No signs of distress

## 2012-08-11 NOTE — ED Provider Notes (Signed)
History     CSN: [id number]  Arrival date & time 08/11/12  0900   First MD Initiated Contact with Patient 08/11/12 [phone]      Chief Complaint  Patient presents with  . Chest Pain  . Shortness of Breath  . Panic Attack    (Consider location/radiation/quality/duration/timing/severity/associated sxs/prior treatment) Patient is a 36 y.o. male presenting with chest pain, shortness of breath, and anxiety.  Chest Pain Primary symptoms include shortness of breath, palpitations and dizziness. Pertinent negatives for primary symptoms include no fever, no cough and no abdominal pain.  The palpitations also occurred with dizziness and shortness of breath. The palpitations did not occur with syncope.  Dizziness does not occur with weakness or diaphoresis.   Pertinent negatives for associated symptoms include no diaphoresis, no numbness and no weakness.  Pertinent negatives for past medical history include no seizures.    Shortness of Breath  Associated symptoms include shortness of breath. Pertinent negatives include no chest pain, no fever and no cough.  Anxiety This is a recurrent problem. The current episode started today. The problem occurs intermittently (used to be 6-7 times a day, on cymbalta it has improved. 1 episode week before last and one last week. ). The problem has been gradually worsening. Pertinent negatives include no abdominal pain, anorexia, arthralgias, change in bowel habit, chest pain, chills, congestion, coughing, diaphoresis, fever, headaches, myalgias, neck pain, numbness or weakness. The symptoms are aggravated by stress.    Past Medical History  Diagnosis Date  . Hypertension   . Bipolar disorder   . Headache   . Diabetes mellitus without complication     History reviewed. No pertinent past surgical history.  Family History  Problem Relation Age of Onset  . Hypertension    . Cancer      lung  . Depression      History  Substance Use Topics  .  Smoking status: Current Every Day Smoker -- 1.0 packs/day    Types: Cigarettes  . Smokeless tobacco: Never Used  . Alcohol Use: 1.5 oz/week    3 drink(s) per week      Review of Systems  Constitutional: Negative for fever, chills, diaphoresis and activity change.  HENT: Negative for congestion and neck pain.   Respiratory: Positive for chest tightness and shortness of breath. Negative for cough.   Cardiovascular: Positive for palpitations. Negative for chest pain.  Gastrointestinal: Negative for abdominal pain, anorexia and change in bowel habit.  Genitourinary: Negative for dysuria.  Musculoskeletal: Negative for myalgias and arthralgias.  Skin: Negative for color change and wound.  Neurological: Positive for dizziness and light-headedness. Negative for seizures, syncope, speech difficulty, weakness, numbness and headaches.  All other systems reviewed and are negative.    Allergies  Review of patient's allergies indicates no known allergies.  Home Medications   Current Outpatient Rx  Name  Route  Sig  Dispense  Refill  . AMLODIPINE BESYLATE 5 MG PO TABS   Oral   Take 1 tablet (5 mg total) by mouth daily.   30 tablet   5   . BC HEADACHE POWDER PO   Oral   Take 1 packet by mouth 3 (three) times daily as needed. For headache/pain         . DULOXETINE HCL 30 MG PO CPEP   Oral   Take 60 mg by mouth daily.          . GLYBURIDE 2.5 MG PO TABS   Oral   Take 1  tablet (2.5 mg total) by mouth 2 (two) times daily with a meal.   60 tablet   5     BP 147/83  Pulse 81  Temp 97.5 F (36.4 C) (Oral)  Resp 18  SpO2 100%  Physical Exam  Nursing note and vitals reviewed. Constitutional: He is oriented to person, place, and time. He appears well-developed and well-nourished. No distress.  HENT:  Head: Normocephalic and atraumatic.  Mouth/Throat: Oropharynx is clear and moist. No oropharyngeal exudate.  Eyes: Conjunctivae normal and EOM are normal. Pupils are equal,  round, and reactive to light. No scleral icterus.  Neck: Normal range of motion. Neck supple. No tracheal deviation present. No thyromegaly present.  Cardiovascular: Normal rate, regular rhythm, normal heart sounds and intact distal pulses.   Pulmonary/Chest: Effort normal and breath sounds normal. No stridor. No respiratory distress. He has no wheezes.  Abdominal: Soft.  Musculoskeletal: Normal range of motion. He exhibits no edema and no tenderness.  Neurological: He is alert and oriented to person, place, and time. Coordination normal.  Skin: Skin is warm and dry. No rash noted. He is not diaphoretic. No erythema. No pallor.  Psychiatric: He has a normal mood and affect. His behavior is normal.    ED Course  Procedures (including critical care time)  Labs Reviewed - No data to display No results found.   No diagnosis found.   Date: 08/11/2012  Rate: 83  Rhythm: normal sinus rhythm  QRS Axis: normal  Intervals: normal  ST/T Wave abnormalities: normal  Conduction Disutrbances: none  Narrative Interpretation:   Old EKG Reviewed: No old    MDM  Hypertension, Panic attack  Patient presents to the emergency department complaining of symptoms consistent with anxiety.  Patient has a history of same with similar episodes.  The patient is resting comfortably, in no apparent distress and asymptomatic. ECG and vital signs reviewed.  No exophthalmos or murmurs on exam Stress reducing mechanisms discussed including caffeine intake.  Patient advised to follow-up with [facility] for discussion of possible medi.  Discharged with a 3 day prescription for Ativan 1mg .             Jaci Carrel, PA-C 08/11/12 1111

## 2012-08-13 NOTE — ED Provider Notes (Signed)
Medical screening examination/treatment/procedure(s) were performed by non-physician practitioner and as supervising physician I was immediately available for consultation/collaboration.   Laray Anger, DO 08/13/12 1735

## 2012-10-20 ENCOUNTER — Emergency Department: Payer: Self-pay | Admitting: Internal Medicine

## 2012-10-20 LAB — CK TOTAL AND CKMB (NOT AT ARMC)
CK, Total: 125 U/L (ref 35–232)
CK-MB: 2 ng/mL (ref 0.5–3.6)

## 2012-10-20 LAB — BASIC METABOLIC PANEL
Anion Gap: 8 (ref 7–16)
BUN: 10 mg/dL (ref 7–18)
Calcium, Total: 9.2 mg/dL (ref 8.5–10.1)
Chloride: 102 mmol/L (ref 98–107)
Co2: 25 mmol/L (ref 21–32)
Creatinine: 0.7 mg/dL (ref 0.60–1.30)
EGFR (African American): >60
EGFR (Non-African Amer.): >60
Glucose: 292 mg/dL — ABNORMAL HIGH (ref 65–99)
Potassium: 4 mmol/L (ref 3.5–5.1)
Sodium: 135 mmol/L — ABNORMAL LOW (ref 136–145)

## 2012-10-20 LAB — CBC
MCH: 33.9 pg (ref 26.0–34.0)
MCHC: 34 g/dL (ref 32.0–36.0)
MCV: 100 fL (ref 80–100)
Platelet: 151 10*3/uL (ref 150–440)
RDW: 13.1 % (ref 11.5–14.5)

## 2012-10-20 LAB — TROPONIN I: Troponin-I: <0.02 ng/mL

## 2012-10-23 ENCOUNTER — Emergency Department (HOSPITAL_COMMUNITY)
Admission: EM | Admit: 2012-10-23 | Discharge: 2012-10-23 | Disposition: A | Discharge status: Home / Self Care | Payer: Self-pay | Attending: Emergency Medicine | Admitting: Emergency Medicine

## 2012-10-23 ENCOUNTER — Ambulatory Visit: Payer: Self-pay | Admitting: Family Medicine

## 2012-10-23 DIAGNOSIS — F3289 Other specified depressive episodes: Secondary | ICD-10-CM | POA: Insufficient documentation

## 2012-10-23 DIAGNOSIS — Z79899 Other long term (current) drug therapy: Secondary | ICD-10-CM | POA: Insufficient documentation

## 2012-10-23 DIAGNOSIS — F329 Major depressive disorder, single episode, unspecified: Secondary | ICD-10-CM | POA: Insufficient documentation

## 2012-10-23 DIAGNOSIS — F411 Generalized anxiety disorder: Secondary | ICD-10-CM | POA: Insufficient documentation

## 2012-10-23 DIAGNOSIS — I10 Essential (primary) hypertension: Secondary | ICD-10-CM | POA: Insufficient documentation

## 2012-10-23 DIAGNOSIS — Z008 Encounter for other general examination: Secondary | ICD-10-CM | POA: Insufficient documentation

## 2012-10-23 DIAGNOSIS — E669 Obesity, unspecified: Secondary | ICD-10-CM | POA: Insufficient documentation

## 2012-10-23 DIAGNOSIS — E119 Type 2 diabetes mellitus without complications: Secondary | ICD-10-CM | POA: Insufficient documentation

## 2012-10-23 DIAGNOSIS — F17200 Nicotine dependence, unspecified, uncomplicated: Secondary | ICD-10-CM | POA: Insufficient documentation

## 2012-10-23 DIAGNOSIS — F319 Bipolar disorder, unspecified: Secondary | ICD-10-CM | POA: Insufficient documentation

## 2012-10-23 LAB — COMPREHENSIVE METABOLIC PANEL
ALT: 32 U/L (ref 0–53)
Albumin: 4 g/dL (ref 3.5–5.2)
Alkaline Phosphatase: 88 U/L (ref 39–117)
BUN: 10 mg/dL (ref 6–23)
Chloride: 100 mEq/L (ref 96–112)
GFR calc Af Amer: >90 mL/min (ref >90)
Glucose, Bld: 231 mg/dL — ABNORMAL HIGH (ref 70–99)
Potassium: 3.8 mEq/L (ref 3.5–5.1)
Total Bilirubin: 0.8 mg/dL (ref 0.3–1.2)

## 2012-10-23 LAB — URINALYSIS, ROUTINE W REFLEX MICROSCOPIC
Ketones, ur: NEGATIVE mg/dL
Leukocytes, UA: NEGATIVE
Nitrite: NEGATIVE
Urobilinogen, UA: 0.2 mg/dL (ref 0.0–1.0)
pH: 7 (ref 5.0–8.0)

## 2012-10-23 LAB — CBC
HCT: 48.9 % (ref 39.0–52.0)
Hemoglobin: 17.2 g/dL — ABNORMAL HIGH (ref 13.0–17.0)
WBC: 7.9 10*3/uL (ref 4.0–10.5)

## 2012-10-23 LAB — ETHANOL: Alcohol, Ethyl (B): <11 mg/dL (ref 0–11)

## 2012-10-23 LAB — RAPID URINE DRUG SCREEN, HOSP PERFORMED
Barbiturates: NOT DETECTED
Benzodiazepines: NOT DETECTED
Tetrahydrocannabinol: POSITIVE — AB

## 2012-10-23 LAB — ACETAMINOPHEN LEVEL: Acetaminophen (Tylenol), Serum: <15 ug/mL (ref 10–30)

## 2012-10-23 NOTE — ED Notes (Signed)
Pt from [facility] with paperwork. Pt needs to be med cleared and then go back to [facility].

## 2012-10-23 NOTE — ED Provider Notes (Signed)
History    This chart was scribed for non-physician practitioner working with Celene Kras, MD by Leone Payor, ED Scribe. This patient was seen in room [facility]CON/[facility] and the patient's care was started at 1453.   CSN: [id number]  Arrival date & time 10/23/12  1453   First MD Initiated Contact with Patient 10/23/12 1528      Chief Complaint  Patient presents with  . Medical Clearance     The history is provided by the patient. No language interpreter was used.    [name] [name] is a 37 y.o. male who presents to the Emergency Department requesting medical clearance today. Pt is here from [facility] and needs to be medically cleared before returning. Pt states he believes his medications for depression and anxiety have stopped working even though he takes them the way he should. States he has depression and anxiety that started a few months ago. States his anxiety attacks have worsened in the last couple of weeks which is why he sought help from [place]. 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 He takes medications for DM and HTN. He denies SI, HI, hallucinations.     Pt has h/o HTN, DM.  Pt is a current everyday smoker and occasional alcohol user.  PCP Dr. Clent Ridges.  Past Medical History  Diagnosis Date  . Hypertension   . Bipolar disorder   . Headache   . Diabetes mellitus without complication     No past surgical history on file.  Family History  Problem Relation Age of Onset  . Hypertension    . Cancer      lung  . Depression      History  Substance Use Topics  . Smoking status: Current Every Day Smoker -- 1.00 packs/day    Types: Cigarettes  . Smokeless tobacco: Never Used  . Alcohol Use: 1.5 oz/week    3 drink(s) per week      Review of Systems  Constitutional: Negative for fever, diaphoresis, appetite change, fatigue and unexpected weight change.  HENT: Negative for mouth sores and neck stiffness.   Eyes: Negative for  visual disturbance.  Respiratory: Negative for cough, chest tightness, shortness of breath and wheezing.   Cardiovascular: Negative for chest pain.  Gastrointestinal: Negative for nausea, vomiting, abdominal pain, diarrhea and constipation.  Endocrine: Negative for polydipsia, polyphagia and polyuria.  Genitourinary: Negative for dysuria, urgency, frequency and hematuria.  Musculoskeletal: Negative for back pain.  Skin: Negative for rash.  Allergic/Immunologic: Negative for immunocompromised state.  Neurological: Negative for syncope, light-headedness and headaches.  Hematological: Does not bruise/bleed easily.  Psychiatric/Behavioral: Negative for suicidal ideas, hallucinations and sleep disturbance. The patient is not nervous/anxious.        Has anxiety.   All other systems reviewed and are negative.    Allergies  Review of patient's allergies indicates no known allergies.  Home Medications   Current Outpatient Rx  Name  Route  Sig  Dispense  Refill  . amLODipine (NORVASC) 5 MG tablet   Oral   Take 2.5 mg by mouth 2 (two) times daily.         . Aspirin-Salicylamide-Caffeine (BC HEADACHE POWDER PO)   Oral   Take 1 packet by mouth 3 (three) times daily as needed. For headache/pain         . busPIRone (BUSPAR) 15 MG tablet   Oral   Take 15 mg by mouth 3 (three) times daily.         Marland Kitchen  DULoxetine (CYMBALTA) 30 MG capsule   Oral   Take 90 mg by mouth at bedtime.          Marland Kitchen glyBURIDE (DIABETA) 2.5 MG tablet   Oral   Take 0.625 mg by mouth daily.         Marland Kitchen LORazepam (ATIVAN) 1 MG tablet   Oral   Take 1 tablet (1 mg total) by mouth 3 (three) times daily as needed for anxiety.   6 tablet   0     BP 146/89  Pulse 90  Temp(Src) 98.7 F (37.1 C) (Oral)  Resp 20  SpO2 99%  Physical Exam  Nursing note and vitals reviewed. Constitutional: He is oriented to person, place, and time. He appears well-developed and well-nourished. No distress.  HENT:  Head:  Normocephalic and atraumatic.  Mouth/Throat: Oropharynx is clear and moist. No oropharyngeal exudate.  Eyes: Conjunctivae are normal. Pupils are equal, round, and reactive to light. No scleral icterus.  Neck: Normal range of motion. Neck supple.  Cardiovascular: Normal rate, regular rhythm, normal heart sounds and intact distal pulses.   Pulmonary/Chest: Effort normal and breath sounds normal. No respiratory distress. He has no wheezes.  Abdominal: Soft. Bowel sounds are normal. He exhibits no mass. There is no tenderness. There is no rebound and no guarding.  Musculoskeletal: Normal range of motion. He exhibits no edema.  Lymphadenopathy:    He has no cervical adenopathy.  Neurological: He is alert and oriented to person, place, and time. No cranial nerve deficit. He exhibits normal muscle tone. Coordination normal.  Speech is clear and goal oriented Moves extremities without ataxia  Skin: Skin is warm and dry. He is not diaphoretic.  Psychiatric: His speech is normal and behavior is normal. Judgment normal. His mood appears anxious. Cognition and memory are normal. Cognition and memory are not impaired. He does not express impulsivity. He exhibits a depressed mood. He expresses no homicidal and no suicidal ideation. He expresses no suicidal plans and no homicidal plans.  Pt tearful throughout exam    ED Course  Procedures (including critical care time)  DIAGNOSTIC STUDIES: Oxygen Saturation is 99% on room air, normal by my interpretation.    COORDINATION OF CARE: 4:54 PM Discussed treatment plan which includes CBC panel, UA, comprehensive metabolic panel, ethanol with pt at bedside and pt agreed to plan.    Labs Reviewed  CBC - Abnormal; Notable for the following:    Hemoglobin 17.2 (*)    MCH 34.5 (*)    All other components within normal limits  COMPREHENSIVE METABOLIC PANEL - Abnormal; Notable for the following:    Glucose, Bld 231 (*)    All other components within normal  limits  SALICYLATE LEVEL - Abnormal; Notable for the following:    Salicylate Lvl <2.0 (*)    All other components within normal limits  URINE RAPID DRUG SCREEN (HOSP PERFORMED) - Abnormal; Notable for the following:    Tetrahydrocannabinol POSITIVE (*)    All other components within normal limits  URINALYSIS, ROUTINE W REFLEX MICROSCOPIC - Abnormal; Notable for the following:    APPearance CLOUDY (*)    Glucose, UA >1000 (*)    Protein, ur 30 (*)    All other components within normal limits  URINE MICROSCOPIC-ADD ON - Abnormal; Notable for the following:    Squamous Epithelial / LPF FEW (*)    All other components within normal limits  ACETAMINOPHEN LEVEL  ETHANOL   No results found.   1. Medical  clearance for psychiatric admission       MDM  Rhonin Trott present from [place].  Patient is to be transferred back to [facility] and is here for medical clearance. Paperwork sent with patient states that he is suicidal with plan to shoot himself and has comes readily available in the home. Patient denies those ideations today. He states he does feel anxious and does feel that he needs help adjusting his medications and "getting his head straight."  Patient is here voluntarily.  Labs largely unremarkable.  Patient blood sugar 231. He states he will see his primary care physician to help adjust his blood pressure and diabetic medications.  He had an appointment for today which she had to cancel because [facility] asking to stay. He is alert and oriented, reasonable and appears reliable for follow.  Patient is medically cleared for [facility].  I personally performed the services described in this documentation, which was scribed in my presence. The recorded information has been reviewed and is accurate.   Dahlia Client Zak Gondek, PA-C 10/23/12 [phone]

## 2012-10-23 NOTE — ED Notes (Signed)
Results faxed to [facility], [facility] called and reported it was okay for pt to come back.

## 2012-10-24 ENCOUNTER — Telehealth (HOSPITAL_COMMUNITY): Payer: Self-pay | Admitting: *Deleted

## 2012-10-24 ENCOUNTER — Encounter (HOSPITAL_COMMUNITY): Payer: Self-pay

## 2012-10-24 ENCOUNTER — Inpatient Hospital Stay (HOSPITAL_COMMUNITY)
Admission: AD | Admit: 2012-10-24 | Discharge: 2012-10-28 | DRG: 881 | Disposition: A | Discharge status: Home / Self Care | Payer: No Typology Code available for payment source | Attending: Psychiatry | Admitting: Psychiatry

## 2012-10-24 ENCOUNTER — Encounter (HOSPITAL_COMMUNITY): Payer: Self-pay | Admitting: *Deleted

## 2012-10-24 DIAGNOSIS — E669 Obesity, unspecified: Secondary | ICD-10-CM

## 2012-10-24 DIAGNOSIS — F411 Generalized anxiety disorder: Secondary | ICD-10-CM | POA: Diagnosis present

## 2012-10-24 DIAGNOSIS — R51 Headache: Secondary | ICD-10-CM

## 2012-10-24 DIAGNOSIS — F329 Major depressive disorder, single episode, unspecified: Principal | ICD-10-CM | POA: Diagnosis present

## 2012-10-24 DIAGNOSIS — I10 Essential (primary) hypertension: Secondary | ICD-10-CM | POA: Diagnosis present

## 2012-10-24 DIAGNOSIS — F3289 Other specified depressive episodes: Principal | ICD-10-CM | POA: Diagnosis present

## 2012-10-24 DIAGNOSIS — Z79899 Other long term (current) drug therapy: Secondary | ICD-10-CM

## 2012-10-24 DIAGNOSIS — F1010 Alcohol abuse, uncomplicated: Secondary | ICD-10-CM | POA: Diagnosis present

## 2012-10-24 DIAGNOSIS — F410 Panic disorder [episodic paroxysmal anxiety] without agoraphobia: Secondary | ICD-10-CM | POA: Diagnosis present

## 2012-10-24 DIAGNOSIS — E119 Type 2 diabetes mellitus without complications: Secondary | ICD-10-CM | POA: Diagnosis present

## 2012-10-24 DIAGNOSIS — L708 Other acne: Secondary | ICD-10-CM

## 2012-10-24 HISTORY — DX: Obesity, unspecified: E66.9

## 2012-10-24 LAB — GLUCOSE, CAPILLARY: Glucose-Capillary: 230 mg/dL — ABNORMAL HIGH (ref 70–99)

## 2012-10-24 MED ORDER — ACETAMINOPHEN 325 MG PO TABS
650.0000 mg | ORAL_TABLET | Freq: Four times a day (QID) | ORAL | Status: DC | PRN
  Administered 2012-10-25 – 2012-10-28 (×2): 650 mg via ORAL

## 2012-10-24 MED ORDER — ALUM & MAG HYDROXIDE-SIMETH 200-200-20 MG/5ML PO SUSP: 30.0000 mL | ORAL | Status: DC | PRN

## 2012-10-24 MED ORDER — MAGNESIUM HYDROXIDE 400 MG/5ML PO SUSP: 30.0000 mL | Freq: Every day | ORAL | Status: DC | PRN

## 2012-10-24 MED ORDER — TRAZODONE HCL 100 MG PO TABS: 100.0000 mg | ORAL_TABLET | Freq: Every evening | ORAL | Status: DC | PRN

## 2012-10-24 MED ORDER — HYDROXYZINE HCL 25 MG PO TABS
25.0000 mg | ORAL_TABLET | Freq: Four times a day (QID) | ORAL | Status: DC | PRN
Start: 2012-10-24 — End: 2012-10-28
  Administered 2012-10-24: 25 mg via ORAL
  Filled 2012-10-24: qty 15

## 2012-10-24 MED ORDER — CHLORDIAZEPOXIDE HCL 25 MG PO CAPS: 25.0000 mg | ORAL_CAPSULE | Freq: Four times a day (QID) | ORAL | Status: DC | PRN

## 2012-10-24 NOTE — Progress Notes (Signed)
D) This is the first admission for the 37 year old white single male (engaged), who is admitted involuntarily to the service of Dr. Daleen Bo. Pt reports that he has been taking antidepressants since age 37 when he had his first anxiety attack. Recently, the last 1 1/2 months, Pt has been having several panic attacks daily. 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 Denies SI and HI. Wants to get his anxiety under control. Pt reports that he drinks beer a couple of times a week from 6-10 beers each time, but has been consuming less alcohol since he has been dealing with the anxiety. Is followed by Vesta Mixer, who has been attempting to adjust his medication over the last couple of weeks. Pt has a medical history of depression, anxiety, high blood pressure and NIDD. Takes oral hypoglycemics. Blood sugar on admission  Was 230. Denies SI and HI, delusions and hallucinations. A) Given support and reassurance along with praise for coming to the hospital. Oriented to the unit an educated on the groups and flow. A history was obtained.  R) Pt is cooperative and wants very much to feel better. States the anxiety makes the depression worse.

## 2012-10-24 NOTE — BH Assessment (Signed)
Assessment Note   [name] [name] is a 37 y.o. single white male.  He is referred from the [facility] in [place] under IVC initiated by [place].  Referral documentation is handwritten and difficult to read.  Pt reports recently worsening depression with symptoms noted in the "risk to self" assessment below, along with severe anxiety.  He reports SI with plan to shoot himself.  He has firearms in his home.  He reportedly has never made a suicide attempt in the past.  Pt denies HI, but reports irritability, and is fearful that he will beat someone up if angered.  He denies AH/VH and presents without delusional thought.  Pt reports that he has been drinking a 12 pack of beer every 2 or 3 days, but has not used in the past week.  Pt was sent by [facility] to the ED last night for medical clearance.  While there his UDS was positive for THC, but no detail is known of his cannabis use.  Pt denies any history of psychiatric hospitalization. He receives outpatient treatment through [facility], and was recently taken off of mirtazapine.  Pt's social supports consist of his father and his brother.  Referral notes indicate on Axis IV that pt is having financial problems, as well as an illegible entry, but precipitating stressors are not otherwise specified.  Axis I: Major Depressive Disorder, recurrent, severe, without psychotic features 296.33 Axis II: Deferred 799.9 Axis III:  Past Medical History  Diagnosis Date  . Hypertension   . Bipolar disorder   . Headache   . Diabetes mellitus without complication   . Obesity 10/24/2012   Axis IV: economic problems Axis V: 41-50 serious symptoms  Past Medical History:  Past Medical History  Diagnosis Date  . Hypertension   . Bipolar disorder   . Headache   . Diabetes mellitus without complication   . Obesity 10/24/2012    No past surgical history on file.  Family History:  Family History  Problem Relation Age of Onset  . Hypertension    .  Cancer      lung  . Depression      Social History:  reports that he has been smoking Cigarettes.  He has been smoking about 1.00 pack per day. He has never used smokeless tobacco. He reports that he drinks about 1.5 ounces of alcohol per week. He reports that he does not use illicit drugs.  Additional Social History:  Alcohol / Drug Use Pain Medications: None reported Prescriptions: None reported Over the Counter: None reported History of alcohol / drug use?:  (UDS + for cannabis; details are unknown) Longest period of sobriety (when/how long): Unknown Negative Consequences of Use:  (None reported) Withdrawal Symptoms:  (None reported) Substance #1 Name of Substance 1: Alcohol 1 - Age of First Use: Unspecified 1 - Amount (size/oz): 12 beers 1 - Frequency: Every 2 - 3 days 1 - Duration: Unspecified 1 - Last Use / Amount: 1 week ago  CIWA:   COWS:    Allergies: No Known Allergies  Home Medications:  (Not in a hospital admission)  OB/GYN Status:  No LMP for male patient.  General Assessment Data Location of Assessment: [facility] Assessment Services Living Arrangements: Alone Can pt return to current living arrangement?: Yes Admission Status: Involuntary Is patient capable of signing voluntary admission?: No Transfer from: Other (Comment) [facility], [place]) Referral Source: [facility], [place])  Education Status Is patient currently in school?: No  Risk  to self Suicidal Ideation: Yes-Currently Present Suicidal Intent: Yes-Currently Present Is patient at risk for suicide?: Yes Suicidal Plan?: Yes-Currently Present Specify Current Suicidal Plan: Shoot self with a gun Access to Means: Yes Specify Access to Suicidal Means: Pt reports having guns in household What has been your use of drugs/alcohol within the last 12 months?: Endorses alcohol; UDS + for cannabis. Previous Attempts/Gestures:  (None reported) How many times?: 0  (None reported) Other Self Harm Risks: Wishes to die Triggers for Past Attempts: Other (Comment) (Not applicable) Intentional Self Injurious Behavior: None Family Suicide History: Unknown Recent stressful life event(s): Other (Comment);Financial Problems (Recent medication change) Persecutory voices/beliefs?: No Depression: Yes Depression Symptoms: Tearfulness;Feeling angry/irritable;Insomnia Substance abuse history and/or treatment for substance abuse?: Yes (Endorses alcohol; UDS + for cannabis.) Suicide prevention information given to non-admitted patients: Not applicable  Risk to Others Homicidal Ideation: No Thoughts of Harm to Others: No Current Homicidal Intent: No Current Homicidal Plan: No Access to Homicidal Means: No Identified Victim: None History of harm to others?: No (None reported) Assessment of Violence: None Noted (Fears he will beat someone up if angered.) Violent Behavior Description: Cooperative at [facility] Does patient have access to weapons?: Yes (Comment) (Pt reports having firearms.) Criminal Charges Pending?: No Does patient have a court date: No  Psychosis Hallucinations: None noted Delusions: None noted  Mental Status Report Appear/Hygiene: Other (Comment) (Appropriate) Eye Contact: Good Motor Activity: Agitation Speech: Other (Comment) (Normal) Level of Consciousness: Alert Mood: Depressed;Anxious;Irritable Affect: Labile Anxiety Level: Severe Thought Processes: Coherent;Relevant Judgement: Unimpaired Orientation: Person;Place;Time Obsessive Compulsive Thoughts/Behaviors: None (None reported)  Cognitive Functioning Concentration: Decreased Memory: Recent Intact;Remote Intact IQ: Average Insight: Fair Impulse Control: Fair Appetite: Fair (Decrease from baseline) Weight Loss:  (Unknown) Weight Gain:  (Unknown) Sleep: Decreased Total Hours of Sleep:  (Unknown) Vegetative Symptoms: None  ADLScreening [facility] Assessment  Services) Patient's cognitive ability adequate to safely complete daily activities?: Yes Independently performs ADLs?: Yes (appropriate for developmental age)  Abuse/Neglect [facility]) Physical Abuse:  (None reported) Verbal Abuse:  (None reported) Sexual Abuse:  (None reported)  Prior Inpatient Therapy Prior Inpatient Therapy: No Prior Therapy Dates: None Prior Therapy Facilty/Provider(s): None Reason for Treatment: None  Prior Outpatient Therapy Prior Outpatient Therapy: Yes Prior Therapy Dates: Current Prior Therapy Facilty/Provider(s): [facility] Reason for Treatment: Unspecfied  ADL Screening (condition at time of admission) Patient's cognitive ability adequate to safely complete daily activities?: Yes Independently performs ADLs?: Yes (appropriate for developmental age) Weakness of Legs: None Weakness of Arms/Hands: None  Home Assistive Devices/Equipment Home Assistive Devices/Equipment:  (None reported; R/O CBG meter)    Abuse/Neglect Assessment (Assessment to be complete while patient is alone) Physical Abuse:  (None reported) Verbal Abuse:  (None reported) Sexual Abuse:  (None reported) Exploitation of patient/patient's resources:  (None reported) Self-Neglect:  (None reported)       Nutrition Screen- [facility] Adult/WL/AP Patient's home diet: Carb modified Have you recently lost weight without trying?: No Have you been eating poorly because of a decreased appetite?: No Malnutrition Screening Tool Score: 0  Additional Information 1:1 In Past 12 Months?: No CIRT Risk: Yes Elopement Risk: No Does patient have medical clearance?: Yes     Disposition:  Disposition Initial Assessment Completed: Yes Disposition of Patient: Inpatient treatment program Type of inpatient treatment program: Adult Pt reviewed with Celso Amy, NP, who agrees to accept him to [facility] to the service of Patrick North, MD, Rm [id number].  I called back to [name] at the referring facility at 15:49 to  notify  her.  On Site Evaluation by:   Reviewed with Physician:  Celso Amy, NP @ 15:30  Doylene Canning, MA Assessment Counselor Raphael Gibney 10/24/2012 5:20 PM

## 2012-10-25 ENCOUNTER — Encounter (HOSPITAL_COMMUNITY): Payer: Self-pay | Admitting: Psychiatry

## 2012-10-25 DIAGNOSIS — F411 Generalized anxiety disorder: Secondary | ICD-10-CM

## 2012-10-25 DIAGNOSIS — F329 Major depressive disorder, single episode, unspecified: Principal | ICD-10-CM

## 2012-10-25 DIAGNOSIS — F1910 Other psychoactive substance abuse, uncomplicated: Secondary | ICD-10-CM

## 2012-10-25 LAB — GLUCOSE, CAPILLARY
Glucose-Capillary: 179 mg/dL — ABNORMAL HIGH (ref 70–99)
Glucose-Capillary: 219 mg/dL — ABNORMAL HIGH (ref 70–99)
Glucose-Capillary: 162 mg/dL — ABNORMAL HIGH (ref 70–99)

## 2012-10-25 MED ORDER — LISINOPRIL 5 MG PO TABS
5.0000 mg | ORAL_TABLET | Freq: Every day | ORAL | Status: DC
  Administered 2012-10-25 – 2012-10-28 (×4): 5 mg via ORAL
  Filled 2012-10-25 (×6): qty 1

## 2012-10-25 MED ORDER — DULOXETINE HCL 30 MG PO CPEP
30.0000 mg | ORAL_CAPSULE | Freq: Every day | ORAL | Status: DC
  Filled 2012-10-25: qty 1

## 2012-10-25 MED ORDER — NICOTINE POLACRILEX 2 MG MT GUM
2.0000 mg | CHEWING_GUM | OROMUCOSAL | Status: DC | PRN
  Administered 2012-10-26 – 2012-10-27 (×2): 2 mg via ORAL

## 2012-10-25 MED ORDER — ESCITALOPRAM OXALATE 5 MG PO TABS
5.0000 mg | ORAL_TABLET | Freq: Every day | ORAL | Status: DC
  Administered 2012-10-25: 09:00:00 via ORAL
  Administered 2012-10-26 – 2012-10-27 (×2): 5 mg via ORAL
  Filled 2012-10-25 (×5): qty 1

## 2012-10-25 MED ORDER — CHLORDIAZEPOXIDE HCL 25 MG PO CAPS: 25.0000 mg | ORAL_CAPSULE | Freq: Four times a day (QID) | ORAL | Status: DC | PRN

## 2012-10-25 MED ORDER — GLYBURIDE 1.25 MG PO TABS
0.6250 mg | ORAL_TABLET | Freq: Two times a day (BID) | ORAL | Status: DC
  Filled 2012-10-25 (×2): qty 0.5

## 2012-10-25 MED ORDER — NICOTINE POLACRILEX 2 MG MT GUM: 2.0000 mg | CHEWING_GUM | OROMUCOSAL | Status: DC | PRN

## 2012-10-25 MED ORDER — GLYBURIDE 1.25 MG PO TABS
1.2500 mg | ORAL_TABLET | Freq: Two times a day (BID) | ORAL | Status: DC
  Administered 2012-10-25 – 2012-10-28 (×6): 1.25 mg via ORAL
  Filled 2012-10-25 (×10): qty 1

## 2012-10-25 MED ORDER — BUSPIRONE HCL 5 MG PO TABS
15.0000 mg | ORAL_TABLET | Freq: Three times a day (TID) | ORAL | Status: DC
  Administered 2012-10-25 – 2012-10-28 (×11): 15 mg via ORAL
  Filled 2012-10-25 (×3): qty 1
  Filled 2012-10-25: qty 63
  Filled 2012-10-25 (×7): qty 1
  Filled 2012-10-25: qty 63
  Filled 2012-10-25 (×3): qty 1
  Filled 2012-10-25: qty 63

## 2012-10-25 MED ORDER — OMEGA-3-ACID ETHYL ESTERS 1 G PO CAPS
1.0000 g | ORAL_CAPSULE | Freq: Two times a day (BID) | ORAL | Status: DC
  Administered 2012-10-25 – 2012-10-28 (×7): 1 g via ORAL
  Filled 2012-10-25 (×4): qty 1
  Filled 2012-10-25: qty 14
  Filled 2012-10-25 (×2): qty 1
  Filled 2012-10-25: qty 14
  Filled 2012-10-25 (×3): qty 1

## 2012-10-25 NOTE — H&P (Addendum)
Psychiatric Admission Assessment Adult  Patient Identification:  [name] [name] Date of Evaluation:  10/25/2012 Chief Complaint:  MDD,REC,SEV History of Present Illness:: 5/10 depression with anxiety/panic attacks 3 x day, started 2 months ago.  He was only on Cymbalta at that time, then started Buspar.  "My life is better than it has ever been but I can't have this."  [name] [name] did LSD one time at the age of 37 and his anxiety started from that point.  He has tried Effexor, Paxil, Prozac with increase dosage until the drug was ineffective.  Now, he feels the Cymbalta may have "run its course."  [name] [name] would like to try another anti-depressant. Associated Signs/Synptoms: Depression Symptoms:  depressed mood, anhedonia, fatigue, suicidal thoughts without plan, anxiety, panic attacks, (Hypo) Manic Symptoms:  None Anxiety Symptoms:  Excessive Worry, Panic Symptoms, Psychotic Symptoms:  Denies PTSD Symptoms:  N/A  Psychiatric Specialty Exam: Physical Exam:  Completed and reviewed, concur with findings, stable  Review of Systems  Constitutional: Negative.   HENT: Negative.   Eyes: Negative.   Respiratory: Negative.   Cardiovascular: Negative.   Gastrointestinal: Negative.   Genitourinary: Negative.   Musculoskeletal: Negative.   Skin: Negative.   Neurological: Negative.   Endo/Heme/Allergies: Negative.   Psychiatric/Behavioral: Positive for depression, suicidal ideas and substance abuse. The patient is nervous/anxious.     Blood pressure 139/84, pulse 105, temperature 98 F (36.7 C), temperature source Oral, resp. rate 20, height 5\' 10"  (1.778 m), SpO2 96.00%.There is no weight on file to calculate BMI.  General Appearance: Casual  Eye Contact::  Fair  Speech:  Normal Rate  Volume:  Normal  Mood:  Depressed  Affect:  Depressed  Thought Process:  Coherent  Orientation:  Full (Time, Place, and Person)  Thought Content:  WDL  Suicidal Thoughts:  Yes.  without intent/plan   Homicidal Thoughts:  No  Memory:  Immediate;   Fair Recent;   Fair Remote;   Fair  Judgement:  Fair  Insight:  Lacking  Psychomotor Activity:  Decreased  Concentration:  Fair  Recall:  Fair  Akathisia:  No  Handed:  Right  AIMS (if indicated):     Assets:  Desire for Improvement Financial Resources/Insurance Housing Resilience Social Support  Sleep:  Number of Hours: 5.5    Past Psychiatric History: Diagnosis:  Anxiety and Depression  Hospitalizations:  None  Outpatient Care:  [facility]  Substance Abuse Care:  None  Self-Mutilation:  None  Suicidal Attempts:  None  Violent Behaviors:  None   Past Medical History:   Past Medical History  Diagnosis Date  . Hypertension   . Bipolar disorder   . Headache   . Diabetes mellitus without complication   . Obesity 10/24/2012   None. Allergies:  No Known Allergies PTA Medications: Prescriptions prior to admission  Medication Sig Dispense Refill  . amLODipine (NORVASC) 5 MG tablet Take 2.5 mg by mouth 2 (two) times daily.      . Aspirin-Salicylamide-Caffeine (BC HEADACHE POWDER PO) Take 1 packet by mouth 3 (three) times daily as needed. For headache/pain      . busPIRone (BUSPAR) 15 MG tablet Take 15 mg by mouth 3 (three) times daily.      . DULoxetine (CYMBALTA) 30 MG capsule Take 90 mg by mouth at bedtime.       Marland Kitchen glyBURIDE (DIABETA) 2.5 MG tablet Take 0.625 mg by mouth daily.      Marland Kitchen LORazepam (ATIVAN) 1 MG tablet Take 1 tablet (1 mg total) by  mouth 3 (three) times daily as needed for anxiety.  6 tablet  0    Previous Psychotropic Medications:  Medication/Dose    See PTA     Substance Abuse History in the last 12 months:  no  Consequences of Substance Abuse: NA  Social History:  reports that he has been smoking Cigarettes.  He has been smoking about 1.00 pack per day. He has never used smokeless tobacco. He reports that he drinks about 1.5 ounces of alcohol per week. He reports that he does not use illicit  drugs. Additional Social History:  Current Place of Residence:   Place of Birth:   Family Members: Marital Status:  Divorced Children:  Sons:  Daughters: Relationships: Education:  [employer] Problems/Performance: Religious Beliefs/Practices: History of Abuse (Emotional/Phsycial/Sexual) Teacher, music History:  None. Legal History: Hobbies/Interests:  Family History:   Family History  Problem Relation Age of Onset  . Hypertension    . Cancer      lung  . Depression      Results for orders placed during the hospital encounter of 10/24/12 (from the past 72 hour(s))  GLUCOSE, CAPILLARY     Status: Abnormal   Collection Time    10/24/12  6:17 PM      Result Value Range   Glucose-Capillary 230 (*) 70 - 99 mg/dL  GLUCOSE, CAPILLARY     Status: Abnormal   Collection Time    10/24/12  9:15 PM      Result Value Range   Glucose-Capillary 180 (*) 70 - 99 mg/dL   Comment 1 Documented in Chart     Comment 2 Notify RN    GLUCOSE, CAPILLARY     Status: Abnormal   Collection Time    10/25/12  6:06 AM      Result Value Range   Glucose-Capillary 179 (*) 70 - 99 mg/dL   Comment 1 Notify RN     Comment 2 Documented in Chart     Psychological Evaluations:  Assessment:   AXIS I:  Depressive Disorder NOS, Generalized Anxiety Disorder and Substance Abuse AXIS II:  Deferred AXIS III:   Past Medical History  Diagnosis Date  . Hypertension   . Bipolar disorder   . Headache   . Diabetes mellitus without complication   . Obesity 10/24/2012   AXIS IV:  occupational problems, other psychosocial or environmental problems, problems related to social environment and problems with primary support group AXIS V:  41-50 serious symptoms  Treatment Plan/Recommendations:  Review of chart, vital signs, medications, and notes. Lexapro will be started to replace the Cymbalta since it is not being effective and has been on Prozac, Effexor, Paxil in the past.  He  requested to be back on lisinopril vs amlodipine for his HTN, started on this medication.   Lovaza started for his elevated triglycerides. 1-Admit for crisis management and stabilization.  Estimated length of stay 5-7 days past his current stay of 1 2-Individual and group therapy encouraged 3-Medication management for depression and anxiety to reduce current symptoms to base line and improve the patient's overall level of functioning:  Medications reviewed with the patient and the new ones above started 4-Coping skills for depression and anxiety developing-- 5-Address health issues--monitoring blood pressures and blood glucose--medications in place 6-Treatment plan in progress to prevent relapse of depression and anxiety 7-Psychosocial education regarding relapse prevention and self-care 8-Health care follow up as needed for blood pressure and glucose elevations 9-Call for consult with hospitalist for additional specialty  patient services as needed.  Treatment Plan Summary: Daily contact with patient to assess and evaluate symptoms and progress in treatment Medication management Current Medications:  Current Facility-Administered Medications  Medication Dose Route Frequency Cherrish Vitali Last Rate Last Dose  . acetaminophen (TYLENOL) tablet 650 mg  650 mg Oral Q6H PRN Nanine Means, NP      . alum & mag hydroxide-simeth (MAALOX/MYLANTA) 200-200-20 MG/5ML suspension 30 mL  30 mL Oral Q4H PRN Nanine Means, NP      . busPIRone (BUSPAR) tablet 15 mg  15 mg Oral TID Nanine Means, NP      . chlordiazePOXIDE (LIBRIUM) capsule 25 mg  25 mg Oral Q6H PRN Nanine Means, NP      . DULoxetine (CYMBALTA) DR capsule 30 mg  30 mg Oral Daily Nanine Means, NP      . glyBURIDE (DIABETA) tablet 0.625 mg  0.625 mg Oral BID WC Nanine Means, NP      . hydrOXYzine (ATARAX/VISTARIL) tablet 25 mg  25 mg Oral Q6H PRN Nanine Means, NP   25 mg at 10/24/12 2043  . lisinopril (PRINIVIL,ZESTRIL) tablet 5 mg  5 mg Oral Daily  Nanine Means, NP      . magnesium hydroxide (MILK OF MAGNESIA) suspension 30 mL  30 mL Oral Daily PRN Nanine Means, NP      . nicotine polacrilex (NICORETTE) gum 2 mg  2 mg Oral PRN Himabindu Ravi, MD      . omega-3 acid ethyl esters (LOVAZA) capsule 1 g  1 g Oral BID Nanine Means, NP      . traZODone (DESYREL) tablet 100 mg  100 mg Oral QHS PRN Nanine Means, NP        Observation Level/Precautions:  15 minute checks  Laboratory:  Completed and reviewed, stable  Psychotherapy:  Individual and group therapy  Medications:  See PTA  Consultations:  None  Discharge Concerns:  None  Estimated LOS:  5-7 days  Other:     I certify that inpatient services furnished can reasonably be expected to improve the patient's condition.   Nanine Means, PMH-NP 3/2/20148:19 AM  Mental status examination.  Patient is 37 year old Caucasian male who appears to be in his his stated age.  He is anxious but cooperative.  He has multiple tattoos in his arms.  He described his mood is depressed and sad and his affect is constricted.  He endorse suicidal thoughts with plan to shoot himself.  He denies any hallucination.  He denies any homicidal thoughts.  His his speech is slow but clear and coherent.  He's alert and oriented x3.  His insight judgment and impulse control is limited.  Assessment and plan Patient is 37 year old Caucasian male who was admitted under involuntary commitment.  Patient noticed worsening of his depression and feeling medicines are not working.  He denies any previous suicidal attempt but admitted recently depression is so worst that he wanted to kill himself.  No psychotic features.  We will admit the patient in the unit for crisis management and stabilization.  We will start antidepressant.  We will increase collateral information.  Encourage him to participate in group milieu therapy.  Patient is positive for marijuana however patient denies using marijuana.  Please see complete psychiatric  assessment note.  Kathryne Sharper, MDI have examined the patient an agreed with the findings of H&P

## 2012-10-25 NOTE — Progress Notes (Signed)
Writer spoke with patient and he c/o having pain in his left elbow and received tylenol for pain he rated a 6. Patient reports that his day has been good and has attended groups during the day. Patient reports that he does not want to take the visteril tonight because it gave him weird dreams and he felt awful when he got up this morning. Patient was informed that he has trazadone available if needing something to aid with sleep. Patient reports that he feels that he won't need anything for sleep tonight. Support and encouragement offered, safety maintained on unit with 15 min checks, will continue to monitor.

## 2012-10-25 NOTE — Clinical Social Work Note (Signed)
[facility] Group Notes:  (Clinical Social Work)  10/25/2012   3:00-4:00PM  Summary of Progress/Problems:    The main focus of today's process group was to define "support" and describe what healthy supports are.  We then discussed how and why to increase patient supports, using motivational interviewing.  An emphasis was placed on using counselor, doctor, therapy groups, self-help groups and problem-specific support groups to expand supports.   The patient expressed that he has helped the elderly at church through a variety of small things like making and taking them [facility].  His brother brought him a book to read here in the hospital, and that was supportive.  He has a lot of supports in place at home, and feels blessed, but feels that regardless of how much support he has, nothing will work if he is not on the right medications.  Type of Therapy:  Process Group  Participation Level:  Active  Participation Quality:  Attentive and Sharing  Affect:  Blunted and Depressed  Cognitive:  Appropriate and Oriented  Insight:  Engaged  Engagement in Therapy:  Engaged  Modes of Intervention:  Education,  Teacher, English as a foreign language, Exploration, Discussion   Ambrose Mantle, LCSW 10/25/2012, 4:15 PM

## 2012-10-25 NOTE — ED Provider Notes (Signed)
Medical screening examination/treatment/procedure(s) were performed by non-physician practitioner and as supervising physician I was immediately available for consultation/collaboration.  Celene Kras, MD 10/25/12 [phone]

## 2012-10-25 NOTE — Progress Notes (Signed)
Writer spoke with patient 1:1 at medication window and he reports adjusting ok to the unit since his admission. Patient attended group this evening and participated. Patient reports to feeling very anxious and received a prn dose of visteril. Patient informed that he will see a doctor/NP/PA on tomorrow and his home medications started. Patient offered support and encouragement, writer will monitor effectiveness of medication given. Patient denies si/hi/a/v hallucinations. Safety maintained on unit with 15 min checks, will continue to monitor.

## 2012-10-25 NOTE — [facility] Suicide Risk Assessment (Signed)
Suicide Risk Assessment  Admission Assessment     Nursing information obtained from:    Demographic factors:    Current Mental Status:    Loss Factors:    Historical Factors:    Risk Reduction Factors:     CLINICAL FACTORS:   Depression:   Aggression Anhedonia Hopelessness  COGNITIVE FEATURES THAT CONTRIBUTE TO RISK:  Closed-mindedness Loss of executive function Polarized thinking Thought constriction (tunnel vision)    SUICIDE RISK:   Moderate:  Frequent suicidal ideation with limited intensity, and duration, some specificity in terms of plans, no associated intent, good self-control, limited dysphoria/symptomatology, some risk factors present, and identifiable protective factors, including available and accessible social support.  PLAN OF CARE:  I certify that inpatient services furnished can reasonably be expected to improve the patient's condition.  ARFEEN,SYED T. 10/25/2012, 12:22 PM

## 2012-10-25 NOTE — Progress Notes (Signed)
Group Topic/Focus:  Gratefulness:  The focus of this group is to help patients identify what two things they are most grateful for in their lives. What helps ground them and to center them on their work to their recovery.  Participation Level:  Active  Participation Quality:  Attentive  Affect:  Appropriate  Cognitive:  Alert  Insight:  Engaged  Engagement in Group:  Engaged  Additional Comments:  Pt stated that he was grateful for a place such as Behavioral Health to come to so that he can feel safe.  Dione Housekeeper

## 2012-10-25 NOTE — Progress Notes (Signed)
Psychoeducational Group Note  Date:  10/25/2012 Time:  1015  Group Topic/Focus:  Making Healthy Choices:   The focus of this group is to help patients identify negative/unhealthy choices they were using prior to admission and identify positive/healthier coping strategies to replace them upon discharge.  Participation Level:  Active  Participation Quality:  Appropriate  Affect:  Appropriate  Cognitive:  Alert  Insight:  Improving  Engagement in Group:  Improving  Additional Comments:    Judge, Christine A 

## 2012-10-25 NOTE — Progress Notes (Signed)
D Lofton is seen out in the milieu...tolerated fair. HE is quiet, he keeps mostly to himself, but is also pleasant and cooperative with staff. HE is engaged in his POC.Marland Kitchenstating " I'm ready to get better...to try something different." He tajkes his medications as ordered, and he requests the vistaril be DC'd " it made me feel awful".  ]  A HE attends his groups, is trying to learn how to be healthier and is acitvely trying to develop healthier coping skills.   R Safety is in place and POC cotn with therapeutic relationship fostered.

## 2012-10-26 DIAGNOSIS — F411 Generalized anxiety disorder: Secondary | ICD-10-CM | POA: Diagnosis present

## 2012-10-26 DIAGNOSIS — F410 Panic disorder [episodic paroxysmal anxiety] without agoraphobia: Secondary | ICD-10-CM | POA: Diagnosis present

## 2012-10-26 LAB — GLUCOSE, CAPILLARY
Glucose-Capillary: 183 mg/dL — ABNORMAL HIGH (ref 70–99)
Glucose-Capillary: 202 mg/dL — ABNORMAL HIGH (ref 70–99)
Glucose-Capillary: 140 mg/dL — ABNORMAL HIGH (ref 70–99)

## 2012-10-26 MED ORDER — DIPHENHYDRAMINE HCL 25 MG PO CAPS
25.0000 mg | ORAL_CAPSULE | Freq: Every evening | ORAL | Status: DC | PRN
  Administered 2012-10-26 – 2012-10-27 (×3): 25 mg via ORAL
  Filled 2012-10-26 (×8): qty 1

## 2012-10-26 NOTE — Progress Notes (Signed)
Adult Psychoeducational Group Note  Date:  10/26/2012 Time:  2015  Group Topic/Focus:  Wrap-Up Group:   The focus of this group is to help patients review their daily goal of treatment and discuss progress on daily workbooks.  Participation Level:  Active  Participation Quality:  Appropriate  Affect:  Appropriate  Cognitive:  Appropriate  Insight: Good  Engagement in Group:  Engaged  Modes of Intervention:  Discussion and Support  Additional Comments:  Pt. Mentioned having a good and was able to keep his anxiety under control today and realized he has to keep hisself distracted when his getting an anxiety attack and deep breath is also helping in maintaining his anxiety   Gwenevere Ghazi Patience 10/26/2012, 10:30 PM

## 2012-10-26 NOTE — Progress Notes (Signed)
Adult Psychoeducational Group Note  Date:  10/26/2012 Time:  1:23 PM  Group Topic/Focus:  Self Care:   The focus of this group is to help patients understand the importance of self-care in order to improve or restore emotional, physical, spiritual, interpersonal, and financial health.  Participation Level:  Active  Participation Quality:  Appropriate, Attentive and Sharing  Affect:  Appropriate  Cognitive:  Alert and Appropriate  Insight: Appropriate  Engagement in Group:  Engaged  Modes of Intervention:  Discussion  Additional Comments:  Pt was appropriate and sharing while attending group. Pt stated that paying bills and making a living is apart of self-care. Pt also stated that scheduling regular dates with spouse was something that he does well when it comes to relationship self-care. He wants to improve how much he allows others to do things for him.   Sharyn Lull 10/26/2012, 1:23 PM

## 2012-10-26 NOTE — Progress Notes (Signed)
[facility] LCSW Group Therapy  10/26/2012 3:13 PM  Type of Therapy:  Group Therapy  Participation Level:  Active  Participation Quality:  Appropriate  Affect:  Appropriate  Cognitive:  Appropriate  Insight:  Engaged  Engagement in Therapy:  Engaged  Modes of Intervention:  Discussion, Exploration, Problem-solving, Rapport Building and Socialization  Summary of Progress/Problems:  Patient shared the obstacle he needs to overcome is anger. He shared he does well overall, but has to learn to take a step back and think before acting.  Wynn Banker 10/26/2012, 3:13 PM

## 2012-10-26 NOTE — Progress Notes (Signed)
[facility] MD Progress Note  10/26/2012 12:22 PM [name] [name]  MRN:  [id number] Subjective:  No anxiety or depression, stated he has not had any anxiety since last night.  He was anxious about seeing his dad and "girl" but it was a good visit, slept well, appetite "good", no suicidal ideations, feels Lexapro will work for him--started over the week-end. Diagnosis:   Axis I: Generalized Anxiety Disorder Axis II: Deferred Axis III:  Past Medical History  Diagnosis Date  . Hypertension   . Bipolar disorder   . Headache   . Diabetes mellitus without complication   . Obesity 10/24/2012   Axis IV: other psychosocial or environmental problems, problems related to social environment and problems with primary support group Axis V: 51-60 moderate symptoms  ADL's:  Intact  Sleep: Good  Appetite:  Good  Suicidal Ideation:  Denies Homicidal Ideation:  Denies  Psychiatric Specialty Exam: Review of Systems  Constitutional: Negative.   HENT: Negative.   Eyes: Negative.   Respiratory: Negative.   Cardiovascular: Negative.   Gastrointestinal: Negative.   Genitourinary: Negative.   Musculoskeletal: Negative.   Skin: Negative.   Neurological: Negative.   Endo/Heme/Allergies: Negative.   Psychiatric/Behavioral: The patient is nervous/anxious.     Blood pressure 130/76, pulse 77, temperature 97.1 F (36.2 C), temperature source Oral, resp. rate 18, height 5\' 10"  (1.778 m), SpO2 96.00%.There is no weight on file to calculate BMI.  General Appearance: Casual  Eye Contact::  Good  Speech:  Normal Rate  Volume:  Normal  Mood:  Negative  Affect:  Congruent  Thought Process:  Coherent  Orientation:  Full (Time, Place, and Person)  Thought Content:  WNL  Suicidal Thoughts:  None  Homicidal Thoughts:  No  Memory:  Immediate;   Fair Recent;   Fair Remote;   Fair  Judgement:  Fair  Insight:  Fair  Psychomotor Activity:  Normal  Concentration:  Fair  Recall:  Fair  Akathisia:  No   Handed:  Right  AIMS (if indicated):     Assets:  Financial Resources/Insurance Housing Resilience Social Support  Sleep:  Number of Hours: 6.75   Current Medications: Current Facility-Administered Medications  Medication Dose Route Frequency Provider Last Rate Last Dose  . acetaminophen (TYLENOL) tablet 650 mg  650 mg Oral Q6H PRN Nanine Means, NP   650 mg at 10/25/12 2005  . alum & mag hydroxide-simeth (MAALOX/MYLANTA) 200-200-20 MG/5ML suspension 30 mL  30 mL Oral Q4H PRN Nanine Means, NP      . busPIRone (BUSPAR) tablet 15 mg  15 mg Oral TID Nanine Means, NP   15 mg at 10/26/12 1144  . chlordiazePOXIDE (LIBRIUM) capsule 25 mg  25 mg Oral Q6H PRN Nanine Means, NP      . escitalopram (LEXAPRO) tablet 5 mg  5 mg Oral Daily Nanine Means, NP   5 mg at 10/26/12 0803  . glyBURIDE (DIABETA) tablet 1.25 mg  1.25 mg Oral BID WC Nanine Means, NP   1.25 mg at 10/26/12 0804  . hydrOXYzine (ATARAX/VISTARIL) tablet 25 mg  25 mg Oral Q6H PRN Nanine Means, NP   25 mg at 10/24/12 2043  . lisinopril (PRINIVIL,ZESTRIL) tablet 5 mg  5 mg Oral Daily Nanine Means, NP   5 mg at 10/26/12 0803  . magnesium hydroxide (MILK OF MAGNESIA) suspension 30 mL  30 mL Oral Daily PRN Nanine Means, NP      . nicotine polacrilex (NICORETTE) gum 2 mg  2 mg Oral PRN Himabindu  Ravi, MD      . omega-3 acid ethyl esters (LOVAZA) capsule 1 g  1 g Oral BID Nanine Means, NP   1 g at 10/26/12 0803  . traZODone (DESYREL) tablet 100 mg  100 mg Oral QHS PRN Nanine Means, NP        Lab Results:  Results for orders placed during the hospital encounter of 10/24/12 (from the past 48 hour(s))  GLUCOSE, CAPILLARY     Status: Abnormal   Collection Time    10/24/12  6:17 PM      Result Value Range   Glucose-Capillary 230 (*) 70 - 99 mg/dL  GLUCOSE, CAPILLARY     Status: Abnormal   Collection Time    10/24/12  9:15 PM      Result Value Range   Glucose-Capillary 180 (*) 70 - 99 mg/dL   Comment 1 Documented in Chart     Comment 2  Notify RN    GLUCOSE, CAPILLARY     Status: Abnormal   Collection Time    10/25/12  6:06 AM      Result Value Range   Glucose-Capillary 179 (*) 70 - 99 mg/dL   Comment 1 Notify RN     Comment 2 Documented in Chart    GLUCOSE, CAPILLARY     Status: Abnormal   Collection Time    10/25/12 11:53 AM      Result Value Range   Glucose-Capillary 208 (*) 70 - 99 mg/dL  GLUCOSE, CAPILLARY     Status: Abnormal   Collection Time    10/25/12  5:12 PM      Result Value Range   Glucose-Capillary 219 (*) 70 - 99 mg/dL   Comment 1 Notify RN     Comment 2 Documented in Chart    GLUCOSE, CAPILLARY     Status: Abnormal   Collection Time    10/25/12  9:23 PM      Result Value Range   Glucose-Capillary 162 (*) 70 - 99 mg/dL   Comment 1 Notify RN    GLUCOSE, CAPILLARY     Status: Abnormal   Collection Time    10/26/12  6:23 AM      Result Value Range   Glucose-Capillary 202 (*) 70 - 99 mg/dL  GLUCOSE, CAPILLARY     Status: Abnormal   Collection Time    10/26/12 11:54 AM      Result Value Range   Glucose-Capillary 140 (*) 70 - 99 mg/dL    Physical Findings: AIMS: Facial and Oral Movements Muscles of Facial Expression: None, normal Lips and Perioral Area: None, normal Jaw: None, normal Tongue: None, normal,Extremity Movements Upper (arms, wrists, hands, fingers): None, normal Lower (legs, knees, ankles, toes): None, normal, Trunk Movements Neck, shoulders, hips: None, normal, Overall Severity Severity of abnormal movements (highest score from questions above): None, normal Incapacitation due to abnormal movements: None, normal Patient's awareness of abnormal movements (rate only patient's report): No Awareness, Dental Status Current problems with teeth and/or dentures?: No Does patient usually wear dentures?: No  CIWA:    COWS:     Treatment Plan Summary: Daily contact with patient to assess and evaluate symptoms and progress in treatment Medication management  Plan:  Review of  chart, vital signs, medications, and notes. 1-Individual and group therapy 2-Medication management for depression and anxiety:  Medications reviewed with the patient and he stated no adverse effects 3-Coping skills for depression and anxiety, stress management 4-Continue crisis stabilization and management 5-Address health issues--monitoring  vital signs, blood pressure elevated at times will monitor based on anxiety levels 6-Treatment plan in progress to prevent relapse of depression and anxiety  Medical Decision Making Problem Points:  Established problem, stable/improving (1) and Review of psycho-social stressors (1) Data Points:  Review of medication regiment & side effects (2)  I certify that inpatient services furnished can reasonably be expected to improve the patient's condition.   Nanine Means, PMH-NP 10/26/2012, 12:22 PM

## 2012-10-26 NOTE — Progress Notes (Signed)
D:  Quadry reports that he slept ok and his appetite is good.  He is rating depression and hopelessness at 1/10.  He denies SI/HI/AVH at this time.  He is attending groups and is interacting appropriately with staff and other patients. A:  Safety checks q 15 minutes.  Medication administered as ordered.  Emotional support provided. R:  SAfety maintained on unit.

## 2012-10-26 NOTE — Progress Notes (Signed)
Patient ID: [name] [name], male   DOB: 22-Feb-1976, 37 y.o.   MRN: [id number] D: Pt. Reports anxiety and depression at "1" of 10. "enjoy groups something in everyone to take home."  Pt. Request something for sleep "like tylenol pm, don't like that Trazodone". A: Writer introduced self to client, spoke to [name], PA to discontinue Trazodone, Diphenhydramine 25 mg po ordered(see MAR).  Pt. Will be monitored q3min for safety. Pt. Encouraged to attend group. R: Pt. Is safe on the unit, pt. Attended group.

## 2012-10-26 NOTE — Progress Notes (Signed)
[facility] Group Notes:  (Nursing/MHT/Case Management/Adjunct)  Date:  10/25/2012 Time:  2000  Type of Therapy:  Psychoeducational Skills  Participation Level:  Active  Participation Quality:  Attentive  Affect:  Appropriate  Cognitive:  Appropriate  Insight:  Good  Engagement in Group:  Improving  Modes of Intervention:  Education  Summary of Progress/Problems: The patient stated that he had an okay day as a whole. He states that his doctor had placed him on new medication. In addition, he mentioned that he dealt with having panic attacks. His goal for tomorrow is to have his medications straightened out.   GOODMAN, BENJAMIN S 10/26/2012, 2:26 AM

## 2012-10-26 NOTE — [facility] Counselor (Signed)
Adult Comprehensive Assessment  Patient ID: [name] [name], male   DOB: [birth year], 37 y.o.   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 Family Relationships: None Surveyor, quantity / Lack of resources (include bankruptcy): None Housing / Lack of housing: None Physical health (include injuries & life threatening diseases): None Social relationships: None Substance abuse: None Bereavement / Loss: None  Living/Environment/Situation:  Living Arrangements: Alone Living conditions (as described by patient or guardian): Very comfortable How long has patient lived in current situation?: 7 years What is atmosphere in current home: Comfortable  Family History:  Marital status: Single Does patient have children?: No  Childhood History:  By whom was/is the patient raised?: Both parents Additional childhood history information: excellent Description of patient's relationship with caregiver when they were a child: Very good Patient's description of current relationship with people who raised him/her: Very good Does patient have siblings?: Yes Number of Siblings: 2 Description of patient's current relationship with siblings: Very good relationships Did patient suffer any verbal/emotional/physical/sexual abuse as a child?: No Did patient suffer from severe childhood neglect?: No Has patient ever been sexually abused/assaulted/raped as an adolescent or adult?: No Was the patient ever a victim of a crime or a disaster?: No Witnessed domestic violence?: No Has patient been effected by domestic violence as an adult?: No  Education:  Highest grade of school patient has completed: Producer, television/film/video Currently a student?: No Learning disability?: No  Employment/Work Situation:   Employment situation: Employed 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 How long has patient been employed?: 22 years (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 when he was in [employer] school) Patient's job has been impacted by current illness: No What is the longest time patient has a held a job?: 22 years Where was the patient employed at that time?: Current job Has patient ever been in the [employer]?: No Has patient ever served in Buyer, retail?: No  Financial Resources:   Financial resources: Income from employment Does patient have a representative payee or guardian?: No  Alcohol/Substance Abuse:   What has been your use of drugs/alcohol within the last 12 months?: None Has alcohol/substance abuse ever caused legal problems?: Yes (DUI in 2006)  Social Support System:   Patient's Community Support System: None Type of faith/religion: [employer] does patient's faith help to cope with current illness?: Chief Operating Officer:   Leisure and Hobbies: Spend time with family and friends.  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  He will Patient will benefit from crisis stabilization, evaluation for medication management, psycho education groups for coping skills development, group therapy and assistance with discharge planning.     [name] [name] July. 10/26/2012

## 2012-10-27 DIAGNOSIS — F410 Panic disorder [episodic paroxysmal anxiety] without agoraphobia: Secondary | ICD-10-CM

## 2012-10-27 LAB — GLUCOSE, CAPILLARY
Glucose-Capillary: 168 mg/dL — ABNORMAL HIGH (ref 70–99)
Glucose-Capillary: 246 mg/dL — ABNORMAL HIGH (ref 70–99)

## 2012-10-27 MED ORDER — ESCITALOPRAM OXALATE 10 MG PO TABS
5.0000 mg | ORAL_TABLET | Freq: Two times a day (BID) | ORAL | Status: DC
  Administered 2012-10-27 – 2012-10-28 (×2): 5 mg via ORAL
  Filled 2012-10-27 (×3): qty 0.5
  Filled 2012-10-27: qty 14
  Filled 2012-10-27: qty 0.5
  Filled 2012-10-27: qty 14

## 2012-10-27 NOTE — Progress Notes (Signed)
[facility] LCSW Group Therapy      Feelings About Diagnosis 1:15 - 2:30 PM          10/27/2012 4:04 PM  Type of Therapy:  Group Therapy  Participation Level:  Active  Participation Quality:  Appropriate  Affect:  Appropriate  Cognitive:  Appropriate  Insight:  Engaged  Engagement in Therapy:  Engaged  Modes of Intervention:  Discussion, Exploration, Problem-solving, Rapport Building and Socialization  Summary of Progress/Problems:  Patient shared he does not allow his diagnosis to bother him.  He stated he knows who he that he is good person.  He shared he understands that there is something going on that causes he mind to be a little off at times. Wynn Banker 10/27/2012, 4:04 PM

## 2012-10-27 NOTE — Progress Notes (Signed)
[facility] LCSW Aftercare Discharge Planning Group Note  10/27/2012 12:15 PM  Participation Quality:  Appropriate  Affect:  Appropriate  Cognitive:  Alert and Appropriate  Insight:  Engaged  Engagement in Group:  Engaged  Modes of Intervention:  Exploration, Problem-solving, Rapport Building and Support  Summary of Progress/Problems:  Patient reports doing okay today but yet having a lot of anxiety.  He rates anxiety at four/five.  He denies SI/HI.  Patient is hopeful to discharge soon.  Wynn Banker 10/27/2012, 12:15 PM

## 2012-10-27 NOTE — Progress Notes (Signed)
Adult Psychoeducational Group Note  Date:  10/27/2012 Time:  1:44 PM  Group Topic/Focus:  Recovery Goals:   The focus of this group is to identify appropriate goals for recovery and establish a plan to achieve them.  Participation Level:  None  Participation Quality:  Inattentive  Affect:  Flat  Cognitive:  Lacking  Insight: Limited  Engagement in Group:  Poor  Modes of Intervention:  Limit-setting  Additional Comments: Pt. Was able to explain that he was dealing with some anxiety issues. Pt. Excused hisself from group due to needing fresh air. Pt. Stated that he is not feeling the same and is having some issues with staying focus. Pt. Was excused to handle this matter.   Sharyn Lull 10/27/2012, 1:44 PM

## 2012-10-27 NOTE — Progress Notes (Signed)
Reviewed

## 2012-10-27 NOTE — Progress Notes (Signed)
Grief and Loss Group  Patients discussed significant losses and grief in there lives.  Patients explored ways to cope effectively with loss and associated grief emotions.    Pt was active in group and attentive to group members as they shared. Pt volunteered to share that he is having difficulty leaving his self-talk to explore speaking with others.  Pt discussed desire to hear others' opinions and to feel comfortable discussing thoughts in group. Pt was able to discuss his loss of happiness and the temporary regain of joy when pt was able to spend time with friends in a spiritual community.  Pt set goal to reconnect with faith through finding a spiritual community with which to engage once pt leaves [facility].  Pt was proactive in voicing needs from group and eliciting support.  Pt expressed feeling proud of himself after choosing to share.  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

## 2012-10-27 NOTE — Progress Notes (Signed)
D:  [name] [name] reports that he did not sleep well last night and feels somewhat tired this morning.  He is rating depression at 2/10 and hopelessness at 1/10.  He wants to review his medications with the MD because he feels as if he is "almost there but not quite".  He still is experiencing some anxiety, but is not requiring medications.  He is attending groups and is interacting appropriately with staff and other patients. A:  Medications given as ordered.  Emotional support provided.  Safety checks q 15 minutes. R:  Safety maintained on unit.

## 2012-10-27 NOTE — Progress Notes (Signed)
[facility] MD Progress Note  10/27/2012 1:45 PM [name] [name]  MRN:  [id number] Subjective:  Patient reporting high anxiety, states he has been on several medications in the past, have not been helpful. Started on Lexapro 5mg , tolerating well. Reporting increased panic attacks more recently.  Diagnosis:   Axis I: Generalized Anxiety Disorder and Panic Disorder Axis II: Deferred Axis III:  Past Medical History  Diagnosis Date  . Hypertension   . Bipolar disorder   . Headache   . Diabetes mellitus without complication   . Obesity 10/24/2012   Axis IV: other psychosocial or environmental problems Axis V: 41-50 serious symptoms  ADL's:  Intact  Sleep: Fair  Appetite:  Fair   Psychiatric Specialty Exam: Review of Systems  Constitutional: Negative.   HENT: Negative.   Eyes: Negative.   Respiratory: Negative.   Cardiovascular: Negative.   Gastrointestinal: Negative.   Genitourinary: Negative.   Musculoskeletal: Negative.   Skin: Negative.   Neurological: Negative.   Endo/Heme/Allergies: Negative.   Psychiatric/Behavioral: Positive for depression. The patient is nervous/anxious.     Blood pressure 124/78, pulse 78, temperature 97.9 F (36.6 C), temperature source Oral, resp. rate 20, height 5\' 10"  (1.778 m), SpO2 96.00%.There is no weight on file to calculate BMI.  General Appearance: Disheveled  Eye Solicitor::  Fair  Speech:  Clear and Coherent  Volume:  Normal  Mood:  Anxious and Dysphoric  Affect:  Constricted  Thought Process:  Circumstantial  Orientation:  Full (Time, Place, and Person)  Thought Content:  Rumination  Suicidal Thoughts:  No  Homicidal Thoughts:  No  Memory:  Immediate;   Fair Recent;   Fair Remote;   Fair  Judgement:  Fair  Insight:  Fair  Psychomotor Activity:  Normal  Concentration:  Fair  Recall:  Fair  Akathisia:  No  Handed:  Right  AIMS (if indicated):     Assets:  Communication Skills Desire for Improvement Social Support  Sleep:   Number of Hours: 6.5   Current Medications: Current Facility-Administered Medications  Medication Dose Route Frequency Provider Last Rate Last Dose  . acetaminophen (TYLENOL) tablet 650 mg  650 mg Oral Q6H PRN Nanine Means, NP   650 mg at 10/25/12 2005  . alum & mag hydroxide-simeth (MAALOX/MYLANTA) 200-200-20 MG/5ML suspension 30 mL  30 mL Oral Q4H PRN Nanine Means, NP      . busPIRone (BUSPAR) tablet 15 mg  15 mg Oral TID Nanine Means, NP   15 mg at 10/27/12 1208  . chlordiazePOXIDE (LIBRIUM) capsule 25 mg  25 mg Oral Q6H PRN Nanine Means, NP      . diphenhydrAMINE (BENADRYL) capsule 25 mg  25 mg Oral QHS,MR X 1 Kerry Hough, PA   25 mg at 10/26/12 2203  . escitalopram (LEXAPRO) tablet 5 mg  5 mg Oral Daily Nanine Means, NP   5 mg at 10/27/12 0752  . glyBURIDE (DIABETA) tablet 1.25 mg  1.25 mg Oral BID WC Nanine Means, NP   1.25 mg at 10/27/12 0752  . hydrOXYzine (ATARAX/VISTARIL) tablet 25 mg  25 mg Oral Q6H PRN Nanine Means, NP   25 mg at 10/24/12 2043  . lisinopril (PRINIVIL,ZESTRIL) tablet 5 mg  5 mg Oral Daily Nanine Means, NP   5 mg at 10/27/12 0752  . magnesium hydroxide (MILK OF MAGNESIA) suspension 30 mL  30 mL Oral Daily PRN Nanine Means, NP      . nicotine polacrilex (NICORETTE) gum 2 mg  2 mg Oral PRN  Patrick North, MD   2 mg at 10/26/12 1544  . omega-3 acid ethyl esters (LOVAZA) capsule 1 g  1 g Oral BID Nanine Means, NP   1 g at 10/27/12 1610    Lab Results:  Results for orders placed during the hospital encounter of 10/24/12 (from the past 48 hour(s))  GLUCOSE, CAPILLARY     Status: Abnormal   Collection Time    10/25/12  5:12 PM      Result Value Range   Glucose-Capillary 219 (*) 70 - 99 mg/dL   Comment 1 Notify RN     Comment 2 Documented in Chart    GLUCOSE, CAPILLARY     Status: Abnormal   Collection Time    10/25/12  9:23 PM      Result Value Range   Glucose-Capillary 162 (*) 70 - 99 mg/dL   Comment 1 Notify RN    GLUCOSE, CAPILLARY     Status: Abnormal    Collection Time    10/26/12  6:23 AM      Result Value Range   Glucose-Capillary 202 (*) 70 - 99 mg/dL  GLUCOSE, CAPILLARY     Status: Abnormal   Collection Time    10/26/12 11:54 AM      Result Value Range   Glucose-Capillary 140 (*) 70 - 99 mg/dL  GLUCOSE, CAPILLARY     Status: Abnormal   Collection Time    10/26/12  4:57 PM      Result Value Range   Glucose-Capillary 225 (*) 70 - 99 mg/dL  GLUCOSE, CAPILLARY     Status: Abnormal   Collection Time    10/26/12  8:26 PM      Result Value Range   Glucose-Capillary 168 (*) 70 - 99 mg/dL  GLUCOSE, CAPILLARY     Status: Abnormal   Collection Time    10/27/12  6:13 AM      Result Value Range   Glucose-Capillary 168 (*) 70 - 99 mg/dL  GLUCOSE, CAPILLARY     Status: Abnormal   Collection Time    10/27/12 11:49 AM      Result Value Range   Glucose-Capillary 167 (*) 70 - 99 mg/dL    Physical Findings: AIMS: Facial and Oral Movements Muscles of Facial Expression: None, normal Lips and Perioral Area: None, normal Jaw: None, normal Tongue: None, normal,Extremity Movements Upper (arms, wrists, hands, fingers): None, normal Lower (legs, knees, ankles, toes): None, normal, Trunk Movements Neck, shoulders, hips: None, normal, Overall Severity Severity of abnormal movements (highest score from questions above): None, normal Incapacitation due to abnormal movements: None, normal Patient's awareness of abnormal movements (rate only patient's report): No Awareness, Dental Status Current problems with teeth and/or dentures?: No Does patient usually wear dentures?: No  CIWA:    COWS:     Treatment Plan Summary: Daily contact with patient to assess and evaluate symptoms and progress in treatment Medication management  Plan: Increase Lexapro to 5mg  po bid, patient describing withdrawal like symptoms at the end of the day, bid dosing will be helpful. Educated patient about CBT and to see a good therapist on discharge, states he has seen  about 20 different therapists has not helped. Plan for discharge tomorrow if stable.  Medical Decision Making Problem Points:  Established problem, stable/improving (1), Review of last therapy session (1) and Review of psycho-social stressors (1) Data Points:  Review of medication regiment & side effects (2) Review of new medications or change in dosage (2)  I certify  that inpatient services furnished can reasonably be expected to improve the patient's condition.   Raygen Linquist 10/27/2012, 1:45 PM

## 2012-10-28 DIAGNOSIS — F1994 Other psychoactive substance use, unspecified with psychoactive substance-induced mood disorder: Secondary | ICD-10-CM

## 2012-10-28 LAB — GLUCOSE, CAPILLARY

## 2012-10-28 MED ORDER — OMEGA-3-ACID ETHYL ESTERS 1 G PO CAPS: 1.0000 g | ORAL_CAPSULE | Freq: Two times a day (BID) | ORAL | Status: DC

## 2012-10-28 MED ORDER — AMLODIPINE BESYLATE 5 MG PO TABS: 2.5000 mg | ORAL_TABLET | Freq: Two times a day (BID) | ORAL | Status: DC

## 2012-10-28 MED ORDER — LISINOPRIL 5 MG PO TABS: 5.0000 mg | ORAL_TABLET | Freq: Every day | ORAL | Status: DC

## 2012-10-28 MED ORDER — GLYBURIDE 1.25 MG PO TABS: 1.2500 mg | ORAL_TABLET | Freq: Two times a day (BID) | ORAL | Status: DC

## 2012-10-28 MED ORDER — BUSPIRONE HCL 15 MG PO TABS: 15.0000 mg | ORAL_TABLET | Freq: Three times a day (TID) | ORAL | Status: DC

## 2012-10-28 MED ORDER — ESCITALOPRAM OXALATE 5 MG PO TABS: 5.0000 mg | ORAL_TABLET | Freq: Two times a day (BID) | ORAL | Status: DC

## 2012-10-28 MED ORDER — HYDROXYZINE HCL 25 MG PO TABS: 25.0000 mg | ORAL_TABLET | Freq: Four times a day (QID) | ORAL | Status: DC | PRN

## 2012-10-28 NOTE — Progress Notes (Signed)
Patient ID: [name] [name], male   DOB: 1975/10/17, 37 y.o.   MRN: [id number] Patient discharged per physician order; patient denies SI/HI and A/V hallucinations; patient received samples, prescriptions, and copy of AVS after it was reviewed; patient sent with norvasc prescription instead of lisinopril prescription and it was clarified with the nurse practitioner Molli Knock before discussing with the patient; patient had no further questions or concerns at this time after he was asked; patient discharged to the lobby ambulatory;

## 2012-10-28 NOTE — Discharge Summary (Signed)
Physician Discharge Summary Note  Patient:  [name] [name] is an 37 y.o., male MRN:  [id number] DOB:  Jun 09, 1976 Patient phone:  [phone] (home)  Patient address:   [address] [address] [place] [address],   Date of Admission:  10/24/2012 Date of Discharge: 10/28/2012  Reason for Admission:  Depression with suicidal plan to shoot himself, alcohol abuse, anxiety  Discharge Diagnoses: Active Problems:   ALCOHOL ABUSE   DEPRESSION   Anxiety state, unspecified  Review of Systems  Constitutional: Negative.   HENT: Negative.   Eyes: Negative.   Respiratory: Negative.   Cardiovascular: Negative.   Gastrointestinal: Negative.   Genitourinary: Negative.   Musculoskeletal: Negative.   Skin: Negative.   Neurological: Negative.   Endo/Heme/Allergies: Negative.   Psychiatric/Behavioral: Positive for depression. The patient is nervous/anxious.    Axis Diagnosis:   AXIS I:  Depressive Disorder NOS, Generalized Anxiety Disorder, Substance Abuse and Substance Induced Mood Disorder AXIS II:  Deferred AXIS III:   Past Medical History  Diagnosis Date  . Hypertension   . Bipolar disorder   . Headache   . Diabetes mellitus without complication   . Obesity 10/24/2012   AXIS IV:  other psychosocial or environmental problems, problems related to social environment and problems with primary support group AXIS V:  61-70 mild symptoms  Level of Care:  OP  Hospital Course:    On admission, Emmitt was referred from the [facility] in [place] under IVC initiated by [place]. Referral documentation is handwritten and difficult to read. Pt reports recently worsening depression with symptoms noted in the "risk to self" assessment below, along with severe anxiety. He reports SI with plan to shoot himself. He has firearms in his home. He reportedly has never made a suicide attempt in the past. Pt denies HI, but reports irritability, and is fearful that he will beat someone up if angered. He  denies AH/VH and presents without delusional thought. Pt reports that he has been drinking a 12 pack of beer every 2 or 3 days, but has not used in the past week. Pt was sent by [facility] to the ED last night for medical clearance. While there his UDS was positive for THC, but no detail is known of his cannabis use. Pt denies any history of psychiatric hospitalization. He receives outpatient treatment through [facility], and was recently taken off of mirtazapine. Pt's social supports consist of his father and his brother. Referral notes indicate on Axis IV that pt is having financial problems, as well as an illegible entry, but precipitating stressors are not otherwise specified.  Cymbalta discontinued and Lexapro started for his depression.  Patient has been on multiple anti-depressants and felt the Cymbalta was no longer working, has not tried Lexapro or Celexa, Lexapro began due to less negative side effects.  Buspar continued for his anxiety and vistaril added for adjunctive therapy.  Lovaza started for his elevated triglycerides.  Ativan stopped due to his alcohol abuse.  Diabetic and blood pressure medication continued.  Patient denied suicidal/homicidal ideations and auditory/visual hallucinations, follow-up appointments encouraged to attend, outside support groups encouraged and information given.  Tristin is physically and mentally stable for discharge, Rx given with a 7 day supply of medications--states no financial issues.  Consults:  None  Significant Diagnostic Studies:  labs: Completed and reviewed,stable  Discharge Vitals:   Blood pressure 145/89, pulse 73, temperature 97.4 F (36.3 C), temperature source Oral, resp. rate 18, height 5\' 10"  (1.778 m), SpO2 96.00%. There is no weight  on file to calculate BMI. Lab Results:   Results for orders placed during the hospital encounter of 10/24/12 (from the past 72 hour(s))  GLUCOSE, CAPILLARY     Status: Abnormal   Collection Time    10/25/12 11:53  AM      Result Value Range   Glucose-Capillary 208 (*) 70 - 99 mg/dL  GLUCOSE, CAPILLARY     Status: Abnormal   Collection Time    10/25/12  5:12 PM      Result Value Range   Glucose-Capillary 219 (*) 70 - 99 mg/dL   Comment 1 Notify RN     Comment 2 Documented in Chart    GLUCOSE, CAPILLARY     Status: Abnormal   Collection Time    10/25/12  9:23 PM      Result Value Range   Glucose-Capillary 162 (*) 70 - 99 mg/dL   Comment 1 Notify RN    GLUCOSE, CAPILLARY     Status: Abnormal   Collection Time    10/26/12  6:23 AM      Result Value Range   Glucose-Capillary 202 (*) 70 - 99 mg/dL  GLUCOSE, CAPILLARY     Status: Abnormal   Collection Time    10/26/12 11:54 AM      Result Value Range   Glucose-Capillary 140 (*) 70 - 99 mg/dL  GLUCOSE, CAPILLARY     Status: Abnormal   Collection Time    10/26/12  4:57 PM      Result Value Range   Glucose-Capillary 225 (*) 70 - 99 mg/dL  GLUCOSE, CAPILLARY     Status: Abnormal   Collection Time    10/26/12  8:26 PM      Result Value Range   Glucose-Capillary 168 (*) 70 - 99 mg/dL  GLUCOSE, CAPILLARY     Status: Abnormal   Collection Time    10/27/12  6:13 AM      Result Value Range   Glucose-Capillary 168 (*) 70 - 99 mg/dL  GLUCOSE, CAPILLARY     Status: Abnormal   Collection Time    10/27/12 11:49 AM      Result Value Range   Glucose-Capillary 167 (*) 70 - 99 mg/dL  GLUCOSE, CAPILLARY     Status: Abnormal   Collection Time    10/27/12  5:00 PM      Result Value Range   Glucose-Capillary 248 (*) 70 - 99 mg/dL  GLUCOSE, CAPILLARY     Status: Abnormal   Collection Time    10/27/12  9:11 PM      Result Value Range   Glucose-Capillary 246 (*) 70 - 99 mg/dL  GLUCOSE, CAPILLARY     Status: Abnormal   Collection Time    10/28/12  5:54 AM      Result Value Range   Glucose-Capillary 162 (*) 70 - 99 mg/dL   Comment 1 Notify RN      Physical Findings: AIMS: Facial and Oral Movements Muscles of Facial Expression: None, normal Lips  and Perioral Area: None, normal Jaw: None, normal Tongue: None, normal,Extremity Movements Upper (arms, wrists, hands, fingers): None, normal Lower (legs, knees, ankles, toes): None, normal, Trunk Movements Neck, shoulders, hips: None, normal, Overall Severity Severity of abnormal movements (highest score from questions above): None, normal Incapacitation due to abnormal movements: None, normal Patient's awareness of abnormal movements (rate only patient's report): No Awareness, Dental Status Current problems with teeth and/or dentures?: No Does patient usually wear dentures?: No  CIWA:  COWS:     Psychiatric Specialty Exam: See Psychiatric Specialty Exam and Suicide Risk Assessment completed by Attending Physician prior to discharge.  Discharge destination:  Home  Is patient on multiple antipsychotic therapies at discharge:  No   Has Patient had three or more failed trials of antipsychotic monotherapy by history:  No Recommended Plan for Multiple Antipsychotic Therapies:  N/A  Discharge Orders   Future Orders Complete By Expires     Activity as tolerated - No restrictions  As directed     Diet - low sodium heart healthy  As directed         Medication List    STOP taking these medications       DULoxetine 30 MG capsule  Commonly known as:  CYMBALTA     LORazepam 1 MG tablet  Commonly known as:  ATIVAN      TAKE these medications     Indication   amLODipine 5 MG tablet  Commonly known as:  NORVASC  Take 0.5 tablets (2.5 mg total) by mouth 2 (two) times daily.   Indication:  High Blood Pressure     BC HEADACHE POWDER PO  Take 1 packet by mouth 3 (three) times daily as needed. For headache/pain      busPIRone 15 MG tablet  Commonly known as:  BUSPAR  Take 1 tablet (15 mg total) by mouth 3 (three) times daily.   Indication:  Anxiety Disorder     escitalopram 5 MG tablet  Commonly known as:  LEXAPRO  Take 1 tablet (5 mg total) by mouth 2 (two) times daily.    Indication:  Depression, Generalized Anxiety Disorder     glyBURIDE 1.25 MG tablet  Commonly known as:  DIABETA  Take 1 tablet (1.25 mg total) by mouth 2 (two) times daily with a meal.   Indication:  Type 2 Diabetes     hydrOXYzine 25 MG tablet  Commonly known as:  ATARAX/VISTARIL  Take 1 tablet (25 mg total) by mouth every 6 (six) hours as needed for anxiety.   Indication:  Anxiety Neurosis     omega-3 acid ethyl esters 1 G capsule  Commonly known as:  LOVAZA  Take 1 capsule (1 g total) by mouth 2 (two) times daily.   Indication:  High Amount of Triglycerides in the Blood           Follow-up Information   Follow up with D. Nolberto Hanlon On 11/03/2012. (You are scheduled to see Dr. Jacquelynn Cree at 8:AM on Tuesday, November 03, 2012)    Contact information:   [address] [place], [place]   [address]  [phone]      Follow-up recommendations:  Activity:  As tolerated Diet:  Low-sodium heart healthy diet   Comments:  Patient will continue his care at [facility]  Total Discharge Time:  Greater than 30 minutes.  SignedNanine Means, PMH-NP 10/28/2012, 9:38 AM

## 2012-10-28 NOTE — [facility] Suicide Risk Assessment (Signed)
Suicide Risk Assessment  Discharge Assessment     Demographic Factors:  Male and Caucasian  Mental Status Per Nursing Assessment::   On Admission:     Current Mental Status by Physician: In full contact with reality. There are no suicidal ideas, plans or intent. His mood is euthymic, his affect is appropriate. Feels ready to go home. Feels the medications were not right and now they are. Will follow up outpatient basis   Loss Factors: NA  Historical Factors: NA  Risk Reduction Factors:   Responsible for children under 31 years of age, Sense of responsibility to family, Employed, Living with another person, especially a relative and Positive social support  Continued Clinical Symptoms:  Severe Anxiety and/or Agitation, depression  Cognitive Features That Contribute To Risk: none identified   Suicide Risk:  Minimal: No identifiable suicidal ideation.  Patients presenting with no risk factors but with morbid ruminations; may be classified as minimal risk based on the severity of the depressive symptoms  Discharge Diagnoses:   AXIS I:  Depressive Disorder NOS and Panic Disorder AXIS II:  Deferred AXIS III:   Past Medical History  Diagnosis Date  . Hypertension   . Bipolar disorder   . Headache   . Diabetes mellitus without complication   . Obesity 10/24/2012   AXIS IV:  none at the moment of D/C AXIS V:  61-70 mild symptoms  Plan Of Care/Follow-up recommendations:  Activity:  as tolerated Diet:  regular Will go back to [place], plans to go back to work in AM Is patient on multiple antipsychotic therapies at discharge:  No   Has Patient had three or more failed trials of antipsychotic monotherapy by history:  No  Recommended Plan for Multiple Antipsychotic Therapies: N/A   LUGO,IRVING A 10/28/2012, 11:29 AM

## 2012-10-28 NOTE — Progress Notes (Signed)
[facility] INPATIENT:  Family/Significant Other Suicide Prevention Education  Suicide Prevention Education:  Education Completed; Alleen Borne, Father, [phone] been identified by the patient as the family member/significant other with whom the patient will be residing, and identified as the person(s) who will aid the patient in the event of a mental health crisis (suicidal ideations/suicide attempt).  With written consent from the patient, the family member/significant other has been provided the following suicide prevention education, prior to the and/or following the discharge of the patient.  The suicide prevention education provided includes the following:  Suicide risk factors  Suicide prevention and interventions  National Suicide Hotline telephone number  [facility] assessment telephone number  [facility] Emergency Assistance 911  [facility] and/or Residential Mobile Crisis Unit telephone number  Request made of family/significant other to:  Remove weapons (e.g., guns, rifles, knives), all items previously/currently identified as safety concern.  Father will secure patient's gun.  Remove drugs/medications (over-the-counter, prescriptions, illicit drugs), all items previously/currently identified as a safety concern.  The family member/significant other verbalizes understanding of the suicide prevention education information provided.  The family member/significant other agrees to remove the items of safety concern listed above.  Wynn Banker 10/28/2012, 9:28 AM

## 2012-10-28 NOTE — Progress Notes (Signed)
Patient resting quietly with eyes closed. Respirations even and unlabored. No distress noted . Q 15 minute check continues as ordered to maintain safety. 

## 2012-10-28 NOTE — Tx Team (Signed)
Interdisciplinary Treatment Plan Update   Date Reviewed:  10/28/2012  Time Reviewed:  10:02 AM  Progress in Treatment:   Attending groups: Yes Participating in groups: Yes Taking medication as prescribed: Yes  Tolerating medication: Yes Family/Significant other contact made: Yes, contact made with father Patient understands diagnosis: Yes  Discussing patient identified problems/goals with staff: Yes Medical problems stabilized or resolved: Yes Denies suicidal/homicidal ideation: Yes Patient has not harmed self or others: Yes  For review of initial/current patient goals, please see plan of care.  Estimated Length of Stay:  Discharge home today.  Reasons for Continued Hospitalization:   New Problems/Goals identified:    Discharge Plan or Barriers:   Home with outpatient follow up at [facility]  Additional Comments:  Patient reports being ready to discharge home today.  He denies SI/HI and rates depression at zero and anxiety at one.  Patient shared he does not want to scheduled with a counselor at this time but was provided contact information.  Attendees:  Patient: [name] [name] 10/28/2012 10:02 AM   Signature:  10/28/2012 10:02 AM  Signature:Tina Arlana Pouch, RN 10/28/2012 10:02 AM  Signature: Leighton Parody, RN 10/28/2012 10:02 AM  Signature: 10/28/2012 10:02 AM  Signature: Fransisca Kaufmann RN 10/28/2012 10:02 AM  Signature:  Juline Patch, LCSW 10/28/2012 10:02 AM  Signature: Silverio Decamp, PMH-NP 10/28/2012 10:02 AM  Signature:  10/28/2012 10:02 AM  Signature:    Signature:    Signature:    Signature:      Scribe for Treatment Team:   Juline Patch,  10/28/2012 10:02 AM

## 2012-10-28 NOTE — Progress Notes (Signed)
Adult Psychoeducational Group Note  Date:  10/28/2012 Time:  1:45 PM  Group Topic/Focus:  Personal Choices and Values:   The focus of this group is to help patients assess and explore the importance of values in their lives, how their values affect their decisions, how they express their values and what opposes their expression.  Participation Level:  Minimal  Participation Quality:  Appropriate and Attentive  Affect:  Appropriate  Cognitive:  Appropriate  Insight: Good  Engagement in Group:  Developing/Improving  Modes of Intervention:  Discussion, Education and Support  Additional Comments:  [name] [name] attended group and participated. Patient shared personal term of values and choices. Patient was asked to expalin the negative and positive choices and values that have been made throughout lifespan, but patient did not comment. Patient then discussed what the outcome of the negative choices effect on patient life. Patient completed form in group on Identifying values and  Choosing a value orientating life worksheet, and explained answers within the group. In the beginning of group patient was reserved and then shared later in the group. Insight was helpful information on patient wanting to get better while here in the hospital.   [name] 10/28/2012, 1:45 PM

## 2012-10-28 NOTE — Progress Notes (Signed)
[facility] Adult Case Management Discharge Plan :  Will you be returning to the same living situation after discharge: Yes,  Patient to return to his home At discharge, do you have transportation home?:Yes,  Patient's father will transport her home. Do you have the ability to pay for your medications:No. Patient will need assistance with meds.  Release of information consent forms completed and in the chart;  Patient's signature needed at discharge.  Patient to Follow up at: Follow-up Information   Follow up with D. Nolberto Hanlon On 11/03/2012. (You are scheduled to see Dr. Jacquelynn Cree at 8:AM on Tuesday, November 03, 2012)    Contact information:   [address] [place], [place]   [address]  [phone]      Patient denies SI/HI:   Yes,  Patient is not endorsing SI/HI or thoughts of self harm.    Safety Planning and Suicide Prevention discussed:  Yes,  Reviewed during aftercare group.  Wynn Banker 10/28/2012, 9:54 AM

## 2012-11-02 LAB — GLUCOSE, CAPILLARY: Glucose-Capillary: 159 mg/dL — ABNORMAL HIGH (ref 70–99)

## 2012-11-02 NOTE — Progress Notes (Signed)
Patient Discharge Instructions:  After Visit Summary (AVS):   Faxed to:  11/02/12 Discharge Summary Note:   Faxed to:  11/02/12 Psychiatric Admission Assessment Note:   Faxed to:  11/02/12 Suicide Risk Assessment - Discharge Assessment:   Faxed to:  11/02/12 Faxed/Sent to the Next Level Care provider:  11/02/12 Faxed to [facility] @ [phone]  Jerelene Redden, 11/02/2012, 3:25 PM

## 2012-11-03 ENCOUNTER — Ambulatory Visit: Payer: Self-pay | Admitting: Family Medicine

## 2012-11-03 NOTE — Discharge Summary (Signed)
Reviewed

## 2012-11-06 ENCOUNTER — Telehealth: Payer: Self-pay | Admitting: Family Medicine

## 2012-11-06 NOTE — Telephone Encounter (Signed)
I am not allowed to discuss this patient's care with anyone unless he gives me written permission

## 2012-11-06 NOTE — Telephone Encounter (Signed)
Patients father called to discuss pt's "condition" with Dr. Clent Ridges. Mr. Pinzon requested to only speak with Mr. Clent Ridges and no one else. Mr. Grzywacz didn't leave any additional details.

## 2012-11-06 NOTE — Telephone Encounter (Signed)
I left voice message with below information. 

## 2012-11-12 ENCOUNTER — Ambulatory Visit: Payer: Self-pay | Admitting: Family Medicine

## 2012-11-12 DIAGNOSIS — Z0289 Encounter for other administrative examinations: Secondary | ICD-10-CM

## 2012-11-18 ENCOUNTER — Encounter: Payer: Self-pay | Admitting: Family Medicine

## 2012-11-18 ENCOUNTER — Ambulatory Visit (INDEPENDENT_AMBULATORY_CARE_PROVIDER_SITE_OTHER): Payer: Self-pay | Admitting: Family Medicine

## 2012-11-18 VITALS — BP 140/90 | HR 84 | Temp 98.8°F | Wt 244.0 lb

## 2012-11-18 DIAGNOSIS — I10 Essential (primary) hypertension: Secondary | ICD-10-CM

## 2012-11-18 DIAGNOSIS — F329 Major depressive disorder, single episode, unspecified: Secondary | ICD-10-CM

## 2012-11-18 DIAGNOSIS — F1010 Alcohol abuse, uncomplicated: Secondary | ICD-10-CM

## 2012-11-18 DIAGNOSIS — F411 Generalized anxiety disorder: Secondary | ICD-10-CM

## 2012-11-18 DIAGNOSIS — L659 Nonscarring hair loss, unspecified: Secondary | ICD-10-CM

## 2012-11-18 DIAGNOSIS — E119 Type 2 diabetes mellitus without complications: Secondary | ICD-10-CM

## 2012-11-18 MED ORDER — AMLODIPINE BESYLATE 5 MG PO TABS: 2.5000 mg | ORAL_TABLET | Freq: Two times a day (BID) | ORAL | Status: DC

## 2012-11-18 MED ORDER — GLYBURIDE 1.25 MG PO TABS: 1.2500 mg | ORAL_TABLET | Freq: Two times a day (BID) | ORAL | Status: DC

## 2012-11-18 NOTE — Progress Notes (Signed)
  Subjective:    Patient ID: [name] [name], male    DOB: 1975/09/23, 37 y.o.   MRN: [id number]  HPI Here to follow up after a stay at the [facility] behavioral health inpatient facility from 10-24-12 to 10-28-12. This was for depression, anxiety, panic attacks, and suicidal ideations. After his meds were changed he stabilized and he has been back at home and at work since then. His depression is much better but he still struggles with anxiety and panic attacks. He is on Lexapro and Buspar. He has had good results from Ativan or Xanax when he gets a panic attack, but the psychiatrists are very reluctant to give him this due to his alcohol abuse. He asks if I can give him some. He is scheduled to follow up with a psychiatrist on 12-04-12. He says he has greatly decreased his use of alcohol from drinking 18 beers every 2 days to now drinking 5 beers every 4-5 days. His BP is stable at home, and his am fasting glucoses run in the 120s and 130s. He asks to see a specialist about losing hair in the beard area, which started about 6 months ago.    Review of Systems  Constitutional: Negative.   Respiratory: Negative.   Cardiovascular: Negative.   Neurological: Negative.   Psychiatric/Behavioral: Positive for agitation. Negative for suicidal ideas, hallucinations, confusion, sleep disturbance, dysphoric mood and decreased concentration. The patient is nervous/anxious. The patient is not hyperactive.        Objective:   Physical Exam  Constitutional: He is oriented to person, place, and time. He appears well-developed and well-nourished. No distress.  Cardiovascular: Normal rate, regular rhythm, normal heart sounds and intact distal pulses.   Pulmonary/Chest: Effort normal and breath sounds normal.  Neurological: He is alert and oriented to person, place, and time.  Skin:  Has a large patch of hair loss under the chin and on the anterior neck  Psychiatric: He has a normal mood and affect. His behavior is  normal. Thought content normal.          Assessment & Plan:  His diabetes and HTN are stable. We will refer him to Dermatology for the hair loss. I told him that I cannot step in and change his psychiatric meds since he is under the care of Psychiatry. He needs to ask them about any such concerns he may have.

## 2013-02-01 IMAGING — CT CT MAXILLOFACIAL WITH CONTRAST
1 series · 15 of 30 positions shown, 19 images · non-contrast
Comparison: No comparison

REASON FOR EXAM: swelling to mandible
COMMENTS:

PROCEDURE:     CT  - CT MAXILLOFACIAL AREA W  - May 29, 2012 [DATE]
RESULT:     History: Swelling of mandible
TECHNIQUE: Multiple axial images obtained of the maxillofacial bones with
coronal reformatted images provided following cerebri milliliters of
[id number]

[Series 4: facial 3.0 st · axial · 0.34mm/px · z∈[-209,-44]mm · 15 of 61 slices shown, 19 images]
[im 3/61  brain]
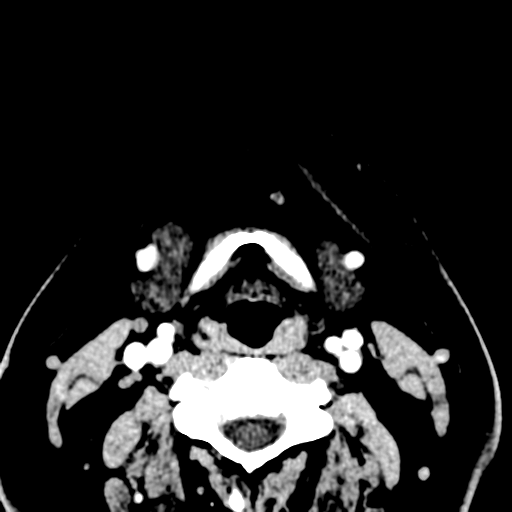
[im 3/61  bone]
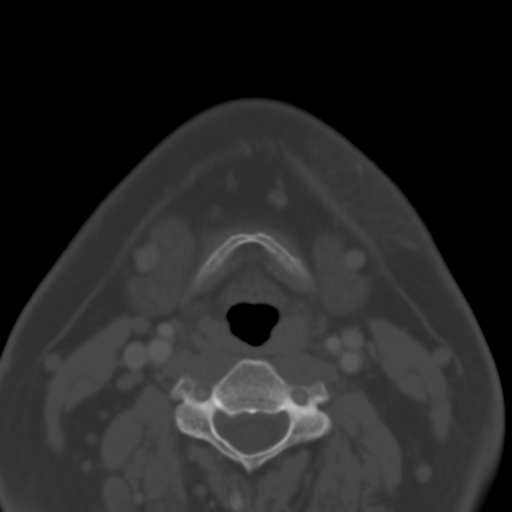
[im 7/61  bone]
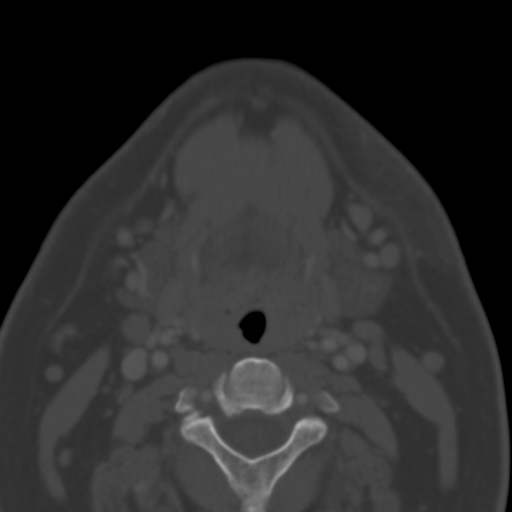
[im 11/61  bone]
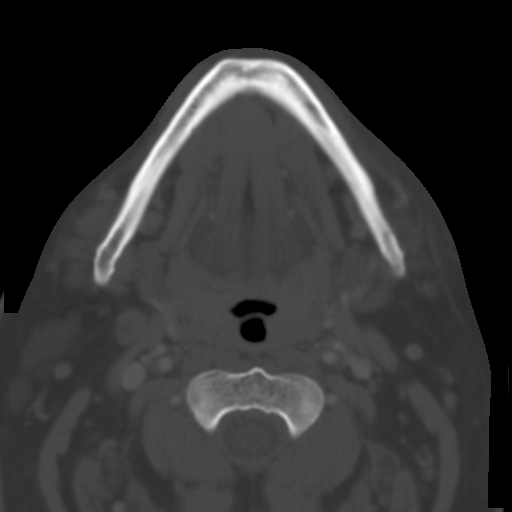
[im 15/61  bone]
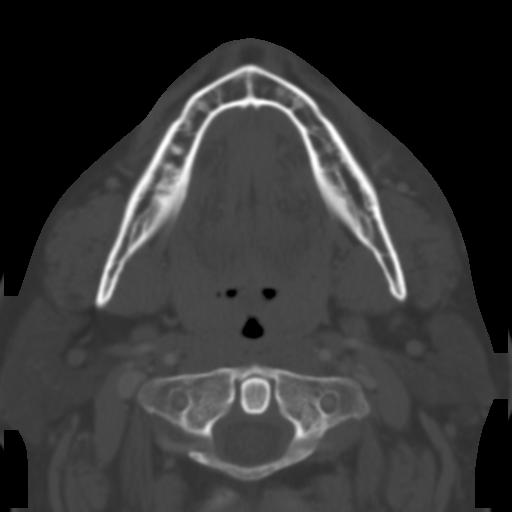
[im 19/61  brain]
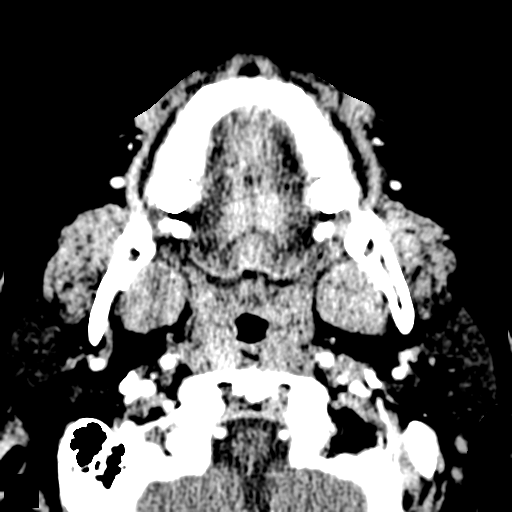
[im 19/61  bone]
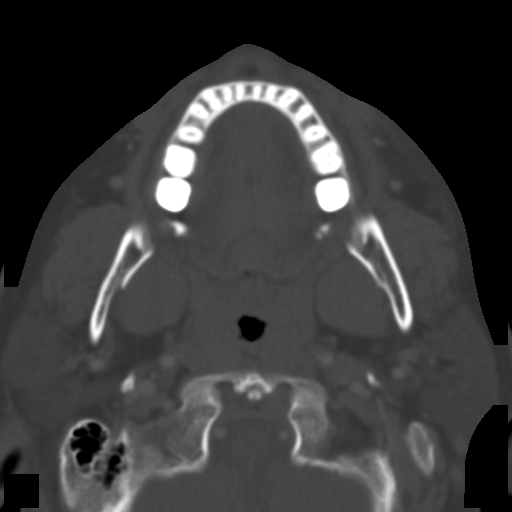
[im 23/61  bone]
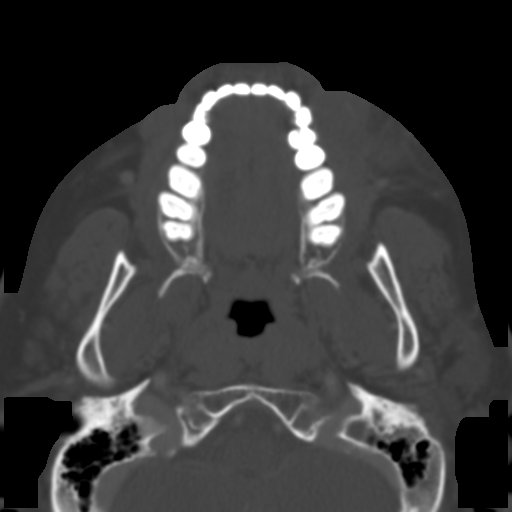
[im 27/61  bone]
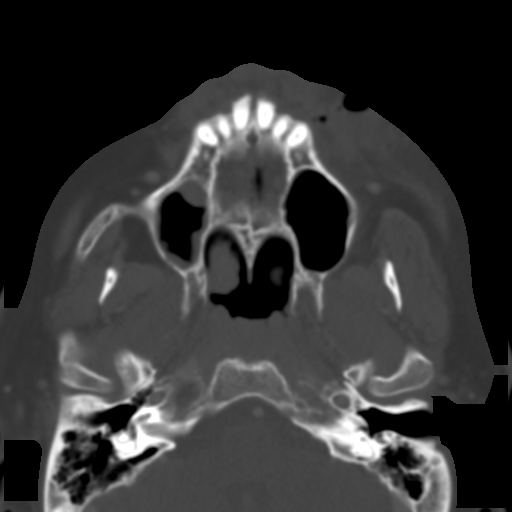
[im 32/61  bone]
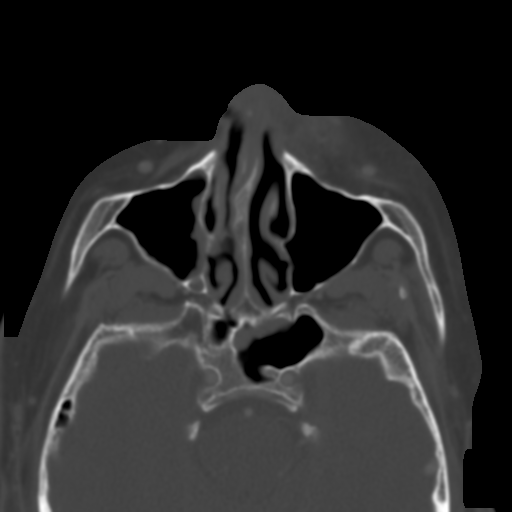
[im 34/61  brain]
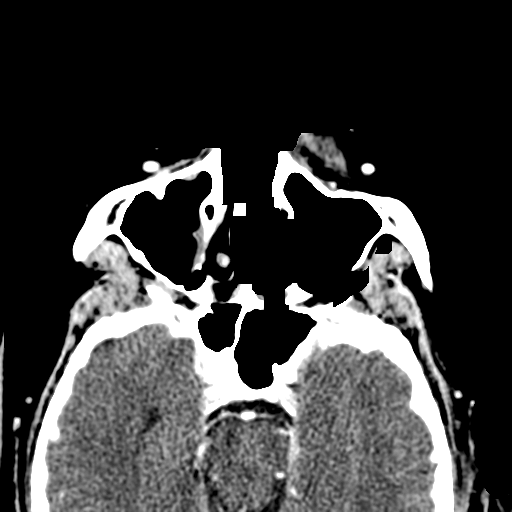
[im 34/61  bone]
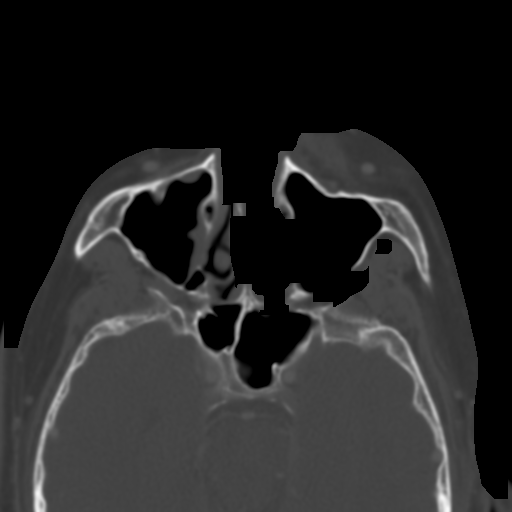
[im 38/61  bone]
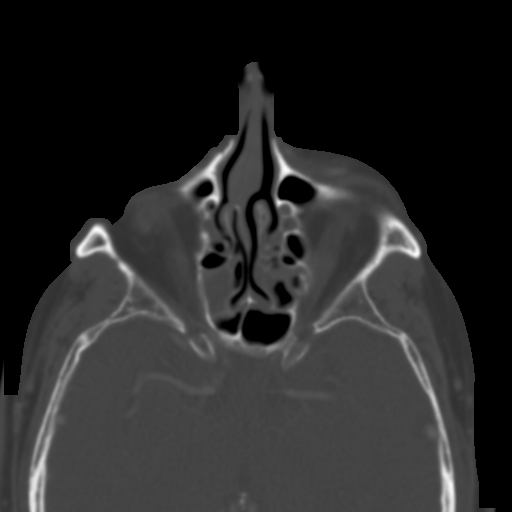
[im 42/61  bone]
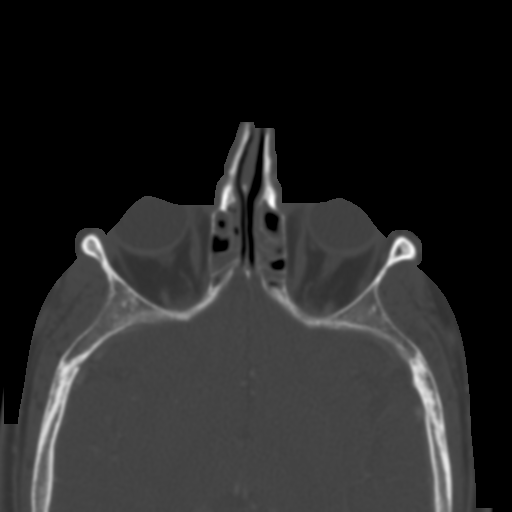
[im 46/61  bone]
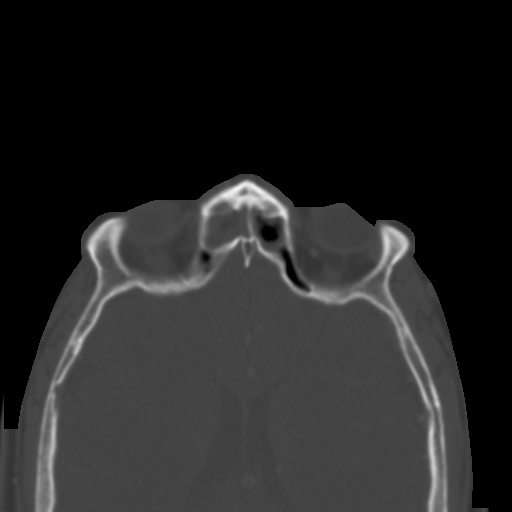
[im 50/61  brain]
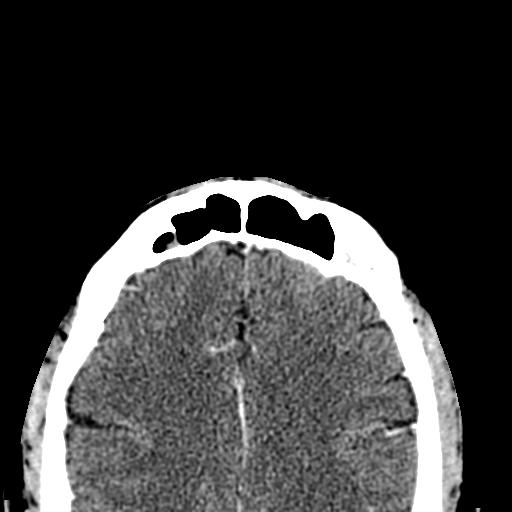
[im 50/61  bone]
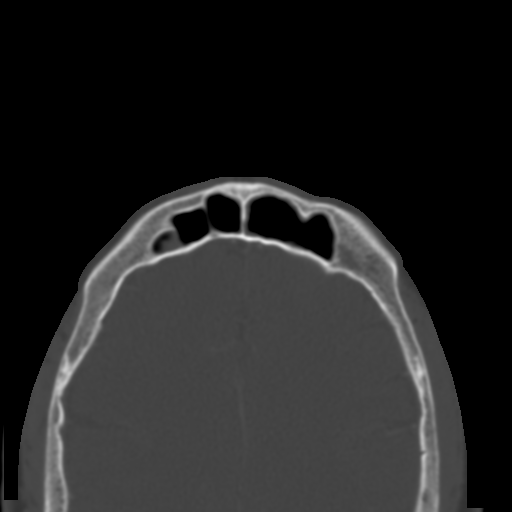
[im 54/61  bone]
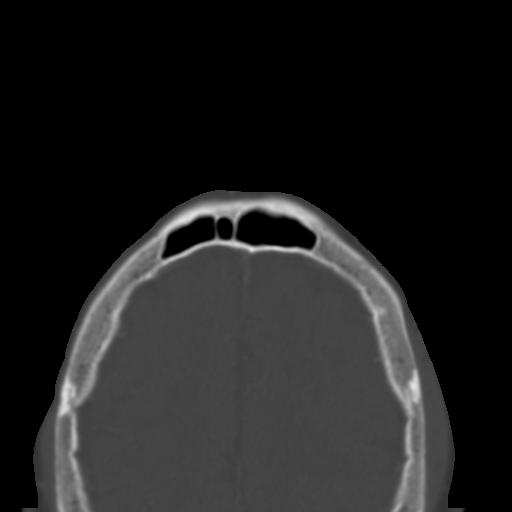
[im 58/61  bone]
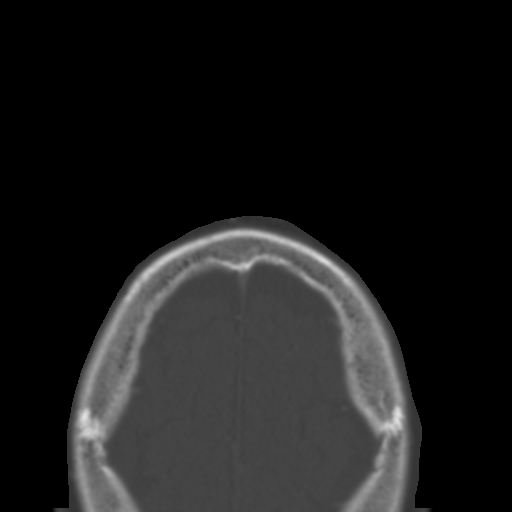

[15 of 30 positions shown; findings below may reference images not displayed]

FINDINGS: There is severe soft tissue swelling overlying the left maxilla with
multiple locules of air within the subcutaneous tissues concerning for and
infection. There is no drainable abscess. The appearance is concerning for
cellulitis.

The globes are intact. The orbital walls are intact. The orbital floor is
intact. The maxilla and mandible are intact. The zygomatic arches are
intact. The nasal septum is midline. There is no nasal bone fracture. The
temporomandibular joints are normal.

There is mild right maxillary sinus the coastal thickening. The visualized
portions of the mastoid sinuses are well aerated.
IMPRESSION: There is severe soft tissue swelling overlying the left maxilla with
multiple locules of air within the subcutaneous tissues concerning for
infection. There is no drainable abscess. The appearance is concerning for
cellulitis.

[REDACTED]

## 2013-02-11 ENCOUNTER — Telehealth: Payer: Self-pay | Admitting: Family Medicine

## 2013-02-11 DIAGNOSIS — R42 Dizziness and giddiness: Secondary | ICD-10-CM

## 2013-02-11 NOTE — Telephone Encounter (Signed)
PT called to request a referral to see a Neurologist regarding his dizzy spells and "not being able to function". Please assist.

## 2013-02-11 NOTE — Telephone Encounter (Signed)
I spoke with pt  

## 2013-02-11 NOTE — Telephone Encounter (Signed)
We did the referral

## 2013-02-18 ENCOUNTER — Emergency Department: Payer: Self-pay | Admitting: Emergency Medicine

## 2013-02-18 LAB — CBC
HCT: 46.9 % (ref 40.0–52.0)
HGB: 16.4 g/dL (ref 13.0–18.0)
MCH: 33.7 pg (ref 26.0–34.0)
MCHC: 35 g/dL (ref 32.0–36.0)
MCV: 96 fL (ref 80–100)
Platelet: 165 10*3/uL (ref 150–440)
RDW: 12.8 % (ref 11.5–14.5)
WBC: 10.2 10*3/uL (ref 3.8–10.6)

## 2013-02-18 LAB — CK TOTAL AND CKMB (NOT AT ARMC)
CK, Total: 75 U/L (ref 35–232)
CK-MB: 1 ng/mL (ref 0.5–3.6)

## 2013-02-18 LAB — COMPREHENSIVE METABOLIC PANEL
Albumin: 3.9 g/dL (ref 3.4–5.0)
Alkaline Phosphatase: 87 U/L (ref 50–136)
Chloride: 106 mmol/L (ref 98–107)
Co2: 26 mmol/L (ref 21–32)
EGFR (African American): >60
Osmolality: 279 (ref 275–301)
Potassium: 3.6 mmol/L (ref 3.5–5.1)
SGOT(AST): 27 U/L (ref 15–37)
SGPT (ALT): 52 U/L (ref 12–78)

## 2013-02-18 LAB — TROPONIN I: Troponin-I: <0.02 ng/mL

## 2013-02-18 LAB — URINALYSIS, COMPLETE
Bilirubin,UR: NEGATIVE
Blood: NEGATIVE
Glucose,UR: >=500 mg/dL (ref 0–75)
Leukocyte Esterase: NEGATIVE
Protein: 30
WBC UR: 1 /HPF (ref 0–5)

## 2013-02-18 LAB — TSH: Thyroid Stimulating Horm: 0.964 u[IU]/mL

## 2013-03-01 ENCOUNTER — Emergency Department: Payer: Self-pay | Admitting: Emergency Medicine

## 2013-03-01 LAB — DRUG SCREEN, URINE
Barbiturates, Ur Screen: NEGATIVE (ref ?–200)
Benzodiazepine, Ur Scrn: POSITIVE (ref ?–200)
Cocaine Metabolite,Ur ~~LOC~~: NEGATIVE (ref ?–300)
MDMA (Ecstasy)Ur Screen: NEGATIVE (ref ?–500)
Methadone, Ur Screen: NEGATIVE (ref ?–300)
Opiate, Ur Screen: NEGATIVE (ref ?–300)
Phencyclidine (PCP) Ur S: NEGATIVE (ref ?–25)
Tricyclic, Ur Screen: NEGATIVE (ref ?–1000)

## 2013-03-01 LAB — CBC
HCT: 46 % (ref 40.0–52.0)
MCH: 33.8 pg (ref 26.0–34.0)
MCHC: 34.8 g/dL (ref 32.0–36.0)
MCV: 97 fL (ref 80–100)
Platelet: 153 10*3/uL (ref 150–440)
RBC: 4.74 10*6/uL (ref 4.40–5.90)
WBC: 9.7 10*3/uL (ref 3.8–10.6)

## 2013-03-01 LAB — COMPREHENSIVE METABOLIC PANEL
Albumin: 3.8 g/dL (ref 3.4–5.0)
Anion Gap: 8 (ref 7–16)
Calcium, Total: 9.5 mg/dL (ref 8.5–10.1)
Osmolality: 277 (ref 275–301)
Potassium: 3.9 mmol/L (ref 3.5–5.1)
SGOT(AST): 23 U/L (ref 15–37)
Sodium: 138 mmol/L (ref 136–145)
Total Protein: 7.6 g/dL (ref 6.4–8.2)

## 2013-03-01 LAB — TROPONIN I: Troponin-I: <0.02 ng/mL

## 2013-03-08 ENCOUNTER — Ambulatory Visit: Payer: Self-pay | Admitting: Neurology

## 2013-03-11 ENCOUNTER — Telehealth: Payer: Self-pay | Admitting: Family Medicine

## 2013-03-11 NOTE — Telephone Encounter (Signed)
Patient calling to request changing back to lisinopril 20mg  daily from his newer dose of amlodipine 2.5mg  BID which he has been using since 11/18/12.  States he liked the lisinopril better, even though his facial hair was falling out.  States his facial hair continues to fall out, and wants to go back on lisinopril.  States his cardiologist likes the lisinopril better, as well.  Uses [facility].  Info to office for provider review/Rx change/callback.  May reach patient [phone].

## 2013-03-12 NOTE — Telephone Encounter (Signed)
pls advise

## 2013-03-14 NOTE — Telephone Encounter (Signed)
Stop the Amlodipine, and switch back to Lisinopril 20 mg a day. Call in one year supply

## 2013-03-15 MED ORDER — LISINOPRIL 20 MG PO TABS: 20.0000 mg | ORAL_TABLET | Freq: Every day | ORAL | Status: DC

## 2013-03-15 NOTE — Telephone Encounter (Signed)
I spoke with pt and sent script e-scribe. 

## 2013-06-11 ENCOUNTER — Telehealth: Payer: Self-pay | Admitting: Family Medicine

## 2013-06-11 NOTE — Telephone Encounter (Signed)
I spoke with [name] [name] and let him know that Dr. Clent Ridges is out of the office this today, will be back in on 06/14/13.

## 2013-06-11 NOTE — Telephone Encounter (Signed)
Patient Information:  Caller Name: Padraic  Phone: [phone]  Patient: [name] [name], [name] [name]  Gender: Male  DOB: 03-Oct-1975  Age: 37 Years  PCP: N/A  Office Follow Up:  Does the office need to follow up with this patient?: Yes  Instructions For The Office: Please review - Wanting Rx called to [facility]  [address] [place] [phone]. Can reach patient at  [phone] as needed.   Symptoms  Reason For Call & Symptoms: Was on Lisinopril and noticed facial hair loss -- Changed to Amlodipine and hair problems improved but developed severe anxiety.  Had hair growth return to chin.  Changed again to Lisinopril 03/11/2013.  Now hair falling out again, lost all of goatee and looks bad again.  Wanting to have Lisinopril changed again to Med  for BP that is not costly due to lack of insurance.  Reviewed Health History In EMR: Yes  Reviewed Medications In EMR: Yes  Reviewed Allergies In EMR: Yes  Reviewed Surgeries / Procedures: Yes  Date of Onset of Symptoms: Unknown  Treatments Tried: dermatologist - hydrocortisone with steroid cream  Treatments Tried Worked: No  Guideline(s) Used:  No Protocol Available - Information Only  Disposition Per Guideline:   Discuss with PCP and Callback by Nurse Today  Reason For Disposition Reached:   Nursing judgment  Advice Given:  N/A  Patient Will Follow Care Advice:  YES

## 2013-06-14 MED ORDER — METOPROLOL SUCCINATE ER 50 MG PO TB24: 50.0000 mg | ORAL_TABLET | Freq: Every day | ORAL | Status: DC

## 2013-06-14 NOTE — Telephone Encounter (Signed)
Stop the Lisinopril and call in Metoprolol succinate 50 mg daily, #30 with 2 rf

## 2013-06-14 NOTE — Telephone Encounter (Signed)
As above.

## 2013-06-14 NOTE — Telephone Encounter (Signed)
I sent script e-scribe and spoke with pt. 

## 2014-01-13 ENCOUNTER — Ambulatory Visit (INDEPENDENT_AMBULATORY_CARE_PROVIDER_SITE_OTHER): Payer: Self-pay | Admitting: Family Medicine

## 2014-01-13 ENCOUNTER — Encounter: Payer: Self-pay | Admitting: Family Medicine

## 2014-01-13 ENCOUNTER — Telehealth: Payer: Self-pay | Admitting: Family Medicine

## 2014-01-13 VITALS — BP 156/85 | HR 76 | Temp 98.6°F | Ht 70.0 in | Wt 250.0 lb

## 2014-01-13 DIAGNOSIS — K5900 Constipation, unspecified: Secondary | ICD-10-CM

## 2014-01-13 DIAGNOSIS — E119 Type 2 diabetes mellitus without complications: Secondary | ICD-10-CM

## 2014-01-13 DIAGNOSIS — I10 Essential (primary) hypertension: Secondary | ICD-10-CM

## 2014-01-13 LAB — CBC WITH DIFFERENTIAL/PLATELET
BASOS PCT: 0.2 % (ref 0.0–3.0)
Basophils Absolute: 0 10*3/uL (ref 0.0–0.1)
EOS PCT: 2.9 % (ref 0.0–5.0)
Eosinophils Absolute: 0.2 10*3/uL (ref 0.0–0.7)
HCT: 48.5 % (ref 39.0–52.0)
HEMOGLOBIN: 16.8 g/dL (ref 13.0–17.0)
Lymphocytes Relative: 25.2 % (ref 12.0–46.0)
Lymphs Abs: 1.9 10*3/uL (ref 0.7–4.0)
MCHC: 34.7 g/dL (ref 30.0–36.0)
MCV: 99.9 fl (ref 78.0–100.0)
MONO ABS: 0.5 10*3/uL (ref 0.1–1.0)
Monocytes Relative: 7.3 % (ref 3.0–12.0)
NEUTROS ABS: 4.9 10*3/uL (ref 1.4–7.7)
NEUTROS PCT: 64.4 % (ref 43.0–77.0)
Platelets: 165 10*3/uL (ref 150.0–400.0)
RBC: 4.85 Mil/uL (ref 4.22–5.81)
RDW: 12.8 % (ref 11.5–15.5)
WBC: 7.6 10*3/uL (ref 4.0–10.5)

## 2014-01-13 LAB — BASIC METABOLIC PANEL
BUN: 11 mg/dL (ref 6–23)
CALCIUM: 9.4 mg/dL (ref 8.4–10.5)
CO2: 26 mEq/L (ref 19–32)
Chloride: 100 mEq/L (ref 96–112)
Creatinine, Ser: 0.8 mg/dL (ref 0.4–1.5)
GFR: 121.07 mL/min (ref >60.00)
Glucose, Bld: 155 mg/dL — ABNORMAL HIGH (ref 70–99)
POTASSIUM: 4.2 meq/L (ref 3.5–5.1)
SODIUM: 133 meq/L — AB (ref 135–145)

## 2014-01-13 LAB — HEPATIC FUNCTION PANEL
ALK PHOS: 66 U/L (ref 39–117)
ALT: 55 U/L — ABNORMAL HIGH (ref 0–53)
AST: 32 U/L (ref 0–37)
Albumin: 4.1 g/dL (ref 3.5–5.2)
BILIRUBIN DIRECT: 0 mg/dL (ref 0.0–0.3)
BILIRUBIN TOTAL: 0.7 mg/dL (ref 0.2–1.2)
Total Protein: 7.7 g/dL (ref 6.0–8.3)

## 2014-01-13 LAB — POCT URINALYSIS DIPSTICK
BILIRUBIN UA: NEGATIVE
KETONES UA: NEGATIVE
Leukocytes, UA: NEGATIVE
NITRITE UA: NEGATIVE
Protein, UA: NEGATIVE
RBC UA: NEGATIVE
SPEC GRAV UA: 1.02
Urobilinogen, UA: 0.2
pH, UA: 6

## 2014-01-13 LAB — LIPID PANEL
CHOL/HDL RATIO: 6
Cholesterol: 237 mg/dL — ABNORMAL HIGH (ref 0–200)
HDL: 36.9 mg/dL — AB (ref >39.00)
LDL Cholesterol: 161 mg/dL — ABNORMAL HIGH (ref 0–99)
Triglycerides: 195 mg/dL — ABNORMAL HIGH (ref 0.0–149.0)
VLDL: 39 mg/dL (ref 0.0–40.0)

## 2014-01-13 LAB — TSH: TSH: 0.85 u[IU]/mL (ref 0.35–4.50)

## 2014-01-13 LAB — HEMOGLOBIN A1C: Hgb A1c MFr Bld: 7.2 % — ABNORMAL HIGH (ref 4.6–6.5)

## 2014-01-13 MED ORDER — GLYBURIDE 1.25 MG PO TABS: 1.2500 mg | ORAL_TABLET | Freq: Two times a day (BID) | ORAL | Status: DC

## 2014-01-13 NOTE — Telephone Encounter (Signed)
Relevant patient education mailed to patient.  

## 2014-01-13 NOTE — Progress Notes (Signed)
   Subjective:    Patient ID: Tanya NonesJeremy Scott Townley, male    DOB: 10/13/1975, 38 y.o.   MRN: [id number]  HPI Here to follow up. His glucoses are fairly stable in the range of 90 to 150. His BP is stable. His only complaint is of constipation. He drinks lots of water and eats plenty of fiber. He has used Miralax and stool softeners and prunes with mixed success.    Review of Systems  Constitutional: Negative.   Respiratory: Negative.   Cardiovascular: Negative.   Gastrointestinal: Positive for constipation. Negative for nausea, vomiting, abdominal pain, diarrhea, blood in stool and abdominal distention.       Objective:   Physical Exam  Constitutional: He appears well-developed and well-nourished.  Cardiovascular: Normal rate, regular rhythm, normal heart sounds and intact distal pulses.   Pulmonary/Chest: Effort normal and breath sounds normal.  Abdominal: Soft. Bowel sounds are normal. He exhibits no distension and no mass. There is no tenderness. There is no rebound and no guarding.          Assessment & Plan:  Get fasting labs. I suggested he try a probiotic like Align daily.

## 2014-01-14 ENCOUNTER — Telehealth: Payer: Self-pay | Admitting: Family Medicine

## 2014-01-14 NOTE — Telephone Encounter (Signed)
Relevant patient education mailed to patient.  

## 2014-02-15 ENCOUNTER — Ambulatory Visit (INDEPENDENT_AMBULATORY_CARE_PROVIDER_SITE_OTHER): Payer: Self-pay | Admitting: Family

## 2014-02-15 ENCOUNTER — Encounter: Payer: Self-pay | Admitting: Family

## 2014-02-15 VITALS — BP 140/90 | HR 92 | Temp 98.2°F | Wt 254.0 lb

## 2014-02-15 DIAGNOSIS — M5416 Radiculopathy, lumbar region: Secondary | ICD-10-CM

## 2014-02-15 DIAGNOSIS — IMO0002 Reserved for concepts with insufficient information to code with codable children: Secondary | ICD-10-CM

## 2014-02-15 DIAGNOSIS — E1165 Type 2 diabetes mellitus with hyperglycemia: Secondary | ICD-10-CM

## 2014-02-15 DIAGNOSIS — IMO0001 Reserved for inherently not codable concepts without codable children: Secondary | ICD-10-CM

## 2014-02-15 DIAGNOSIS — M545 Low back pain, unspecified: Secondary | ICD-10-CM

## 2014-02-15 MED ORDER — CYCLOBENZAPRINE HCL 10 MG PO TABS: 10.0000 mg | ORAL_TABLET | Freq: Three times a day (TID) | ORAL | Status: DC | PRN

## 2014-02-15 MED ORDER — DICLOFENAC SODIUM 75 MG PO TBEC: 75.0000 mg | DELAYED_RELEASE_TABLET | Freq: Two times a day (BID) | ORAL | Status: DC

## 2014-02-15 NOTE — Progress Notes (Signed)
Pre visit review using our clinic review tool, if applicable. No additional management support is needed unless otherwise documented below in the visit note. 

## 2014-02-15 NOTE — Progress Notes (Signed)
Subjective:    Patient ID: [name] [name], male    DOB: 06/22/1976, 38 y.o.   MRN: [id number]  HPI 38 year old white male, smoker, is in today with complaints of low back pain that radiates down his right leg with right leg numbness x3 weeks. He's been taking ibuprofen without much obese. Rates the pain a 5 out of and, worse with sitting and lying for an extended period of time. Denies any back injury. 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  Patient has type 2 diabetes last hemoglobin A1c 7.2. Reports blood sugars 120-180. Currently on DiaBeta 1.25 daily.  Review of Systems  Constitutional: Negative.   Respiratory: Negative.   Cardiovascular: Negative.   Gastrointestinal: Negative.   Endocrine: Negative.   Genitourinary: Negative.   Musculoskeletal: Positive for back pain.  Skin: Negative.   Neurological: Negative.   Hematological: Negative.   Psychiatric/Behavioral: Negative.    Past Medical History  Diagnosis Date  . Hypertension   . Bipolar disorder     sees [facility]   . Headache(784.0)   . Diabetes mellitus without complication   . Obesity 10/24/2012  . Alcohol abuse     History   Social History  . Marital Status: Single    Spouse Name: N/A    Number of Children: N/A  . Years of Education: N/A   Occupational History  . Not on file.   Social History Main Topics  . Smoking status: Current Every Day Smoker -- 1.00 packs/day    Types: Cigarettes  . Smokeless tobacco: Never Used  . Alcohol Use: 0.5 oz/week    1 drink(s) per week  . Drug Use: No  . Sexual Activity: Not on file   Other Topics Concern  . Not on file   Social History Narrative  . No narrative on file    History reviewed. No pertinent past surgical history.  Family History  Problem Relation Age of Onset  . Hypertension    . Cancer      lung  . Depression      No Known Allergies  Current Outpatient Prescriptions on File Prior to Visit    Medication Sig Dispense Refill  . DULoxetine (CYMBALTA) 30 MG capsule Take 30 mg by mouth 2 (two) times daily.      . fish oil-omega-3 fatty acids 1000 MG capsule Take 2 g by mouth daily.      Marland Kitchen. glyBURIDE (DIABETA) 1.25 MG tablet Take 1 tablet (1.25 mg total) by mouth 2 (two) times daily with a meal.  60 tablet  11  . lisinopril (PRINIVIL,ZESTRIL) 20 MG tablet Take 20 mg by mouth daily.      Marland Kitchen. loratadine (CLARITIN) 10 MG tablet Take 10 mg by mouth daily.      . [DISCONTINUED] metFORMIN (GLUCOPHAGE) 500 MG tablet Take 1 tablet (500 mg total) by mouth 2 (two) times daily with a meal.  60 tablet  3   No current facility-administered medications on file prior to visit.    BP 140/90  Pulse 92  Temp(Src) 98.2 F (36.8 C) (Oral)  Wt 254 lb (115.214 kg)chart    Objective:   Physical Exam  Constitutional: He is oriented to person, place, and time. He appears well-developed and well-nourished.  HENT:  Right Ear: External ear normal.  Left Ear: External ear normal.  Nose: Nose normal.  Mouth/Throat: Oropharynx is clear and moist.  Neck: Normal range of motion. Neck supple.  Cardiovascular: Normal rate, regular rhythm  and normal heart sounds.   Pulmonary/Chest: Effort normal and breath sounds normal.  Abdominal: Soft. Bowel sounds are normal.  Musculoskeletal: He exhibits tenderness.       Arms: Neurological: He is alert and oriented to person, place, and time.  Skin: Skin is warm and dry.  Psychiatric: He has a normal mood and affect.          Assessment & Plan:  [name] [name] was seen today for sciatica.  Diagnoses and associated orders for this visit:  Lumbar radiculopathy, acute  Left low back pain, with sciatica presence unspecified  Type II or unspecified type diabetes mellitus without mention of complication, uncontrolled  Other Orders - cyclobenzaprine (FLEXERIL) 10 MG tablet; Take 1 tablet (10 mg total) by mouth 3 (three) times daily as needed for muscle  spasms. - diclofenac (VOLTAREN) 75 MG EC tablet; Take 1 tablet (75 mg total) by mouth 2 (two) times daily.    probably also been a question or concerns. Consider MRI of the L-spine if symptoms persist or physical therapy.

## 2014-02-15 NOTE — Patient Instructions (Signed)
Lumbosacral Radiculopathy Lumbosacral radiculopathy is a pinched nerve or nerves in the low back (lumbosacral area). When this happens you may have weakness in your legs and may not be able to stand on your toes. You may have pain going down into your legs. There may be difficulties with walking normally. There are many causes of this problem. Sometimes this may happen from an injury, or simply from arthritis or boney problems. It may also be caused by other illnesses such as diabetes. If there is no improvement after treatment, further studies may be done to find the exact cause. DIAGNOSIS  X-rays may be needed if the problems become long standing. Electromyograms may be done. This study is one in which the working of nerves and muscles is studied. HOME CARE INSTRUCTIONS   Applications of ice packs may be helpful. Ice can be used in a plastic bag with a towel around it to prevent frostbite to skin. This may be used every 2 hours for 20 to 30 minutes, or as needed, while awake, or as directed by your caregiver.  Only take over-the-counter or prescription medicines for pain, discomfort, or fever as directed by your caregiver.  If physical therapy was prescribed, follow your caregiver's directions. SEEK IMMEDIATE MEDICAL CARE IF:   You have pain not controlled with medications.  You seem to be getting worse rather than better.  You develop increasing weakness in your legs.  You develop loss of bowel or bladder control.  You have difficulty with walking or balance, or develop clumsiness in the use of your legs.  You have a fever. MAKE SURE YOU:   Understand these instructions.  Will watch your condition.  Will get help right away if you are not doing well or get worse. Document Released: 08/12/2005 Document Revised: 11/04/2011 Document Reviewed: 04/01/2008 [employer] Patient Information 2015 [employer]. This information is not intended to replace advice given to you by your health  care Catelynn Sparger. Make sure you discuss any questions you have with your health care Mylinda Brook.  Low Back Sprain with Rehab  A sprain is an injury in which a ligament is torn. The ligaments of the lower back are vulnerable to sprains. However, they are strong and require great force to be injured. These ligaments are important for stabilizing the spinal column. Sprains are classified into three categories. Grade 1 sprains cause pain, but the tendon is not lengthened. Grade 2 sprains include a lengthened ligament, due to the ligament being stretched or partially ruptured. With grade 2 sprains there is still function, although the function may be decreased. Grade 3 sprains involve a complete tear of the tendon or muscle, and function is usually impaired. SYMPTOMS  Severe pain in the lower back. Sometimes, a feeling of a "pop," "snap," or tear, at the time of injury. Tenderness and sometimes swelling at the injury site. Uncommonly, bruising (contusion) within 48 hours of injury. Muscle spasms in the back. CAUSES  Low back sprains occur when a force is placed on the ligaments that is greater than they can handle. Common causes of injury include: Performing a stressful act while off-balance. Repetitive stressful activities that involve movement of the lower back. Direct hit (trauma) to the lower back. RISK INCREASES WITH: Contact sports (football, wrestling). Collisions (major skiing accidents). Sports that require throwing or lifting (baseball, weightlifting). Sports involving twisting of the spine (gymnastics, diving, tennis, golf). Poor strength and flexibility. Inadequate protection. Previous back injury or surgery (especially fusion). PREVENTION Wear properly fitted and padded protective   equipment. Warm up and stretch properly before activity. Allow for adequate recovery between workouts. Maintain physical fitness: Strength, flexibility, and endurance. Cardiovascular fitness. Maintain a  healthy body weight. PROGNOSIS  If treated properly, low back sprains usually heal with non-surgical treatment. The length of time for healing depends on the severity of the injury.  RELATED COMPLICATIONS  Recurring symptoms, resulting in a chronic problem. Chronic inflammation and pain in the low back. Delayed healing or resolution of symptoms, especially if activity is resumed too soon. Prolonged impairment. Unstable or arthritic joints of the low back. TREATMENT  Treatment first involves the use of ice and medicine, to reduce pain and inflammation. The use of strengthening and stretching exercises may help reduce pain with activity. These exercises may be performed at home or with a therapist. Severe injuries may require referral to a therapist for further evaluation and treatment, such as ultrasound. Your caregiver may advise that you wear a back brace or corset, to help reduce pain and discomfort. Often, prolonged bed rest results in greater harm then benefit. Corticosteroid injections may be recommended. However, these should be reserved for the most serious cases. It is important to avoid using your back when lifting objects. At night, sleep on your back on a firm mattress, with a pillow placed under your knees. If non-surgical treatment is unsuccessful, surgery may be needed.  MEDICATION  If pain medicine is needed, nonsteroidal anti-inflammatory medicines (aspirin and ibuprofen), or other minor pain relievers (acetaminophen), are often advised. Do not take pain medicine for 7 days before surgery. Prescription pain relievers may be given, if your caregiver thinks they are needed. Use only as directed and only as much as you need. Ointments applied to the skin may be helpful. Corticosteroid injections may be given by your caregiver. These injections should be reserved for the most serious cases, because they may only be given a certain number of times. HEAT AND COLD Cold treatment (icing)  should be applied for 10 to 15 minutes every 2 to 3 hours for inflammation and pain, and immediately after activity that aggravates your symptoms. Use ice packs or an ice massage. Heat treatment may be used before performing stretching and strengthening activities prescribed by your caregiver, physical therapist, or athletic trainer. Use a heat pack or a warm water soak. SEEK MEDICAL CARE IF:  Symptoms get worse or do not improve in 2 to 4 weeks, despite treatment. You develop numbness or weakness in either leg. You lose bowel or bladder function. Any of the following occur after surgery: fever, increased pain, swelling, redness, drainage of fluids, or bleeding in the affected area. New, unexplained symptoms develop. (Drugs used in treatment may produce side effects.) EXERCISES  RANGE OF MOTION (ROM) AND STRETCHING EXERCISES - Low Back Sprain Most people with lower back pain will find that their symptoms get worse with excessive bending forward (flexion) or arching at the lower back (extension). The exercises that will help resolve your symptoms will focus on the opposite motion.  Your physician, physical therapist or athletic trainer will help you determine which exercises will be most helpful to resolve your lower back pain. Do not complete any exercises without first consulting with your caregiver. Discontinue any exercises which make your symptoms worse, until you speak to your caregiver. If you have pain, numbness or tingling which travels down into your buttocks, leg or foot, the goal of the therapy is for these symptoms to move closer to your back and eventually resolve. Sometimes, these leg symptoms   will get better, but your lower back pain may worsen. This is often an indication of progress in your rehabilitation. Be very alert to any changes in your symptoms and the activities in which you participated in the 24 hours prior to the change. Sharing this information with your caregiver will allow  him or her to most efficiently treat your condition. These exercises may help you when beginning to rehabilitate your injury. Your symptoms may resolve with or without further involvement from your physician, physical therapist or athletic trainer. While completing these exercises, remember:  Restoring tissue flexibility helps normal motion to return to the joints. This allows healthier, less painful movement and activity. An effective stretch should be held for at least 30 seconds. A stretch should never be painful. You should only feel a gentle lengthening or release in the stretched tissue. FLEXION RANGE OF MOTION AND STRETCHING EXERCISES: STRETCH - Flexion, Single Knee to Chest  Lie on a firm bed or floor with both legs extended in front of you. Keeping one leg in contact with the floor, bring your opposite knee to your chest. Hold your leg in place by either grabbing behind your thigh or at your knee. Pull until you feel a gentle stretch in your low back. Hold __________ seconds. Slowly release your grasp and repeat the exercise with the opposite side. Repeat __________ times. Complete this exercise __________ times per day.  STRETCH - Flexion, Double Knee to Chest Lie on a firm bed or floor with both legs extended in front of you. Keeping one leg in contact with the floor, bring your opposite knee to your chest. Tense your stomach muscles to support your back and then lift your other knee to your chest. Hold your legs in place by either grabbing behind your thighs or at your knees. Pull both knees toward your chest until you feel a gentle stretch in your low back. Hold __________ seconds. Tense your stomach muscles and slowly return one leg at a time to the floor. Repeat __________ times. Complete this exercise __________ times per day.  STRETCH - Low Trunk Rotation Lie on a firm bed or floor. Keeping your legs in front of you, bend your knees so they are both pointed toward the ceiling and  your feet are flat on the floor. Extend your arms out to the side. This will stabilize your upper body by keeping your shoulders in contact with the floor. Gently and slowly drop both knees together to one side until you feel a gentle stretch in your low back. Hold for __________ seconds. Tense your stomach muscles to support your lower back as you bring your knees back to the starting position. Repeat the exercise to the other side. Repeat __________ times. Complete this exercise __________ times per day  EXTENSION RANGE OF MOTION AND FLEXIBILITY EXERCISES: STRETCH - Extension, Prone on Elbows  Lie on your stomach on the floor, a bed will be too soft. Place your palms about shoulder width apart and at the height of your head. Place your elbows under your shoulders. If this is too painful, stack pillows under your chest. Allow your body to relax so that your hips drop lower and make contact more completely with the floor. Hold this position for __________ seconds. Slowly return to lying flat on the floor. Repeat __________ times. Complete this exercise __________ times per day.  RANGE OF MOTION - Extension, Prone Press Ups Lie on your stomach on the floor, a bed will be too soft.   Place your palms about shoulder width apart and at the height of your head. Keeping your back as relaxed as possible, slowly straighten your elbows while keeping your hips on the floor. You may adjust the placement of your hands to maximize your comfort. As you gain motion, your hands will come more underneath your shoulders. Hold this position __________ seconds. Slowly return to lying flat on the floor. Repeat __________ times. Complete this exercise __________ times per day.  RANGE OF MOTION- Quadruped, Neutral Spine  Assume a hands and knees position on a firm surface. Keep your hands under your shoulders and your knees under your hips. You may place padding under your knees for comfort. Drop your head and point  your tailbone toward the ground below you. This will round out your lower back like an angry cat. Hold this position for __________ seconds. Slowly lift your head and release your tail bone so that your back sags into a large arch, like an old horse. Hold this position for __________ seconds. Repeat this until you feel limber in your low back. Now, find your "sweet spot." This will be the most comfortable position somewhere between the two previous positions. This is your neutral spine. Once you have found this position, tense your stomach muscles to support your low back. Hold this position for __________ seconds. Repeat __________ times. Complete this exercise __________ times per day.  STRENGTHENING EXERCISES - Low Back Sprain These exercises may help you when beginning to rehabilitate your injury. These exercises should be done near your "sweet spot." This is the neutral, low-back arch, somewhere between fully rounded and fully arched, that is your least painful position. When performed in this safe range of motion, these exercises can be used for people who have either a flexion or extension based injury. These exercises may resolve your symptoms with or without further involvement from your physician, physical therapist or athletic trainer. While completing these exercises, remember:  Muscles can gain both the endurance and the strength needed for everyday activities through controlled exercises. Complete these exercises as instructed by your physician, physical therapist or athletic trainer. Increase the resistance and repetitions only as guided. You may experience muscle soreness or fatigue, but the pain or discomfort you are trying to eliminate should never worsen during these exercises. If this pain does worsen, stop and make certain you are following the directions exactly. If the pain is still present after adjustments, discontinue the exercise until you can discuss the trouble with your  caregiver. STRENGTHENING - Deep Abdominals, Pelvic Tilt  Lie on a firm bed or floor. Keeping your legs in front of you, bend your knees so they are both pointed toward the ceiling and your feet are flat on the floor. Tense your lower abdominal muscles to press your low back into the floor. This motion will rotate your pelvis so that your tail bone is scooping upwards rather than pointing at your feet or into the floor. With a gentle tension and even breathing, hold this position for __________ seconds. Repeat __________ times. Complete this exercise __________ times per day.  STRENGTHENING - Abdominals, Crunches  Lie on a firm bed or floor. Keeping your legs in front of you, bend your knees so they are both pointed toward the ceiling and your feet are flat on the floor. Cross your arms over your chest. Slightly tip your chin down without bending your neck. Tense your abdominals and slowly lift your trunk high enough to just clear your shoulder   blades. Lifting higher can put excessive stress on the lower back and does not further strengthen your abdominal muscles. Control your return to the starting position. Repeat __________ times. Complete this exercise __________ times per day.  STRENGTHENING - Quadruped, Opposite UE/LE Lift  Assume a hands and knees position on a firm surface. Keep your hands under your shoulders and your knees under your hips. You may place padding under your knees for comfort. Find your neutral spine and gently tense your abdominal muscles so that you can maintain this position. Your shoulders and hips should form a rectangle that is parallel with the floor and is not twisted. Keeping your trunk steady, lift your right hand no higher than your shoulder and then your left leg no higher than your hip. Make sure you are not holding your breath. Hold this position for __________ seconds. Continuing to keep your abdominal muscles tense and your back steady, slowly return to your  starting position. Repeat with the opposite arm and leg. Repeat __________ times. Complete this exercise __________ times per day.  STRENGTHENING - Abdominals and Quadriceps, Straight Leg Raise  Lie on a firm bed or floor with both legs extended in front of you. Keeping one leg in contact with the floor, bend the other knee so that your foot can rest flat on the floor. Find your neutral spine, and tense your abdominal muscles to maintain your spinal position throughout the exercise. Slowly lift your straight leg off the floor about 6 inches for a count of 15, making sure to not hold your breath. Still keeping your neutral spine, slowly lower your leg all the way to the floor. Repeat this exercise with each leg __________ times. Complete this exercise __________ times per day. POSTURE AND BODY MECHANICS CONSIDERATIONS - Low Back Sprain Keeping correct posture when sitting, standing or completing your activities will reduce the stress put on different body tissues, allowing injured tissues a chance to heal and limiting painful experiences. The following are general guidelines for improved posture. Your physician or physical therapist will provide you with any instructions specific to your needs. While reading these guidelines, remember: The exercises prescribed by your Cintya Daughety will help you have the flexibility and strength to maintain correct postures. The correct posture provides the best environment for your joints to work. All of your joints have less wear and tear when properly supported by a spine with good posture. This means you will experience a healthier, less painful body. Correct posture must be practiced with all of your activities, especially prolonged sitting and standing. Correct posture is as important when doing repetitive low-stress activities (typing) as it is when doing a single heavy-load activity (lifting). RESTING POSITIONS Consider which positions are most painful for you when  choosing a resting position. If you have pain with flexion-based activities (sitting, bending, stooping, squatting), choose a position that allows you to rest in a less flexed posture. You would want to avoid curling into a fetal position on your side. If your pain worsens with extension-based activities (prolonged standing, working overhead), avoid resting in an extended position such as sleeping on your stomach. Most people will find more comfort when they rest with their spine in a more neutral position, neither too rounded nor too arched. Lying on a non-sagging bed on your side with a pillow between your knees, or on your back with a pillow under your knees will often provide some relief. Keep in mind, being in any one position for a prolonged period   of time, no matter how correct your posture, can still lead to stiffness. PROPER SITTING POSTURE In order to minimize stress and discomfort on your spine, you must sit with correct posture. Sitting with good posture should be effortless for a healthy body. Returning to good posture is a gradual process. Many people can work toward this most comfortably by using various supports until they have the flexibility and strength to maintain this posture on their own. When sitting with proper posture, your ears will fall over your shoulders and your shoulders will fall over your hips. You should use the back of the chair to support your upper back. Your lower back will be in a neutral position, just slightly arched. You may place a small pillow or folded towel at the base of your lower back for  support.  When working at a desk, create an environment that supports good, upright posture. Without extra support, muscles tire, which leads to excessive strain on joints and other tissues. Keep these recommendations in mind: CHAIR: A chair should be able to slide under your desk when your back makes contact with the back of the chair. This allows you to work closely. The  chair's height should allow your eyes to be level with the upper part of your monitor and your hands to be slightly lower than your elbows. BODY POSITION Your feet should make contact with the floor. If this is not possible, use a foot rest. Keep your ears over your shoulders. This will reduce stress on your neck and low back. INCORRECT SITTING POSTURES  If you are feeling tired and unable to assume a healthy sitting posture, do not slouch or slump. This puts excessive strain on your back tissues, causing more damage and pain. Healthier options include: Using more support, like a lumbar pillow. Switching tasks to something that requires you to be upright or walking. Talking a brief walk. Lying down to rest in a neutral-spine position. PROLONGED STANDING WHILE SLIGHTLY LEANING FORWARD  When completing a task that requires you to lean forward while standing in one place for a long time, place either foot up on a stationary 2-4 inch high object to help maintain the best posture. When both feet are on the ground, the lower back tends to lose its slight inward curve. If this curve flattens (or becomes too large), then the back and your other joints will experience too much stress, tire more quickly, and can cause pain. CORRECT STANDING POSTURES Proper standing posture should be assumed with all daily activities, even if they only take a few moments, like when brushing your teeth. As in sitting, your ears should fall over your shoulders and your shoulders should fall over your hips. You should keep a slight tension in your abdominal muscles to brace your spine. Your tailbone should point down to the ground, not behind your body, resulting in an over-extended swayback posture.  INCORRECT STANDING POSTURES  Common incorrect standing postures include a forward head, locked knees and/or an excessive swayback. WALKING Walk with an upright posture. Your ears, shoulders and hips should all line-up. PROLONGED  ACTIVITY IN A FLEXED POSITION When completing a task that requires you to bend forward at your waist or lean over a low surface, try to find a way to stabilize 3 out of 4 of your limbs. You can place a hand or elbow on your thigh or rest a knee on the surface you are reaching across. This will provide you more stability, so that   your muscles do not tire as quickly. By keeping your knees relaxed, or slightly bent, you will also reduce stress across your lower back. CORRECT LIFTING TECHNIQUES DO : Assume a wide stance. This will provide you more stability and the opportunity to get as close as possible to the object which you are lifting. Tense your abdominals to brace your spine. Bend at the knees and hips. Keeping your back locked in a neutral-spine position, lift using your leg muscles. Lift with your legs, keeping your back straight. Test the weight of unknown objects before attempting to lift them. Try to keep your elbows locked down at your sides in order get the best strength from your shoulders when carrying an object. Always ask for help when lifting heavy or awkward objects. INCORRECT LIFTING TECHNIQUES DO NOT:  Lock your knees when lifting, even if it is a small object. Bend and twist. Pivot at your feet or move your feet when needing to change directions. Assume that you can safely pick up even a paperclip without proper posture. Document Released: 08/12/2005 Document Revised: 11/04/2011 Document Reviewed: 11/24/2008 [employer] Patient Information 2015 [employer]. This information is not intended to replace advice given to you by your health care Sade Mehlhoff. Make sure you discuss any questions you have with your health care Sammie Schermerhorn.  

## 2014-03-25 ENCOUNTER — Other Ambulatory Visit: Payer: Self-pay | Admitting: Family Medicine

## 2014-10-23 ENCOUNTER — Other Ambulatory Visit: Payer: Self-pay | Admitting: Family Medicine

## 2015-01-20 ENCOUNTER — Telehealth: Payer: Self-pay | Admitting: Family Medicine

## 2015-01-20 ENCOUNTER — Other Ambulatory Visit: Payer: Self-pay | Admitting: Family Medicine

## 2015-01-20 NOTE — Telephone Encounter (Signed)
done

## 2015-01-20 NOTE — Telephone Encounter (Signed)
Sent to the pharmacy for 30 days.  Will send a message to scheduling.  Past due for yearly visit.

## 2015-01-20 NOTE — Telephone Encounter (Signed)
Pt now due for yearly visit and fasting lab work.  Please help the pt to make these appts.  I have filled his glyBURIDE (DIABETA) 1.25 MG tablet for 30 days.  Please send message back to Aniceto BossSylvia Nimmons, RN so she may enter lab work. Thanks!

## 2015-02-15 ENCOUNTER — Encounter: Payer: Self-pay | Admitting: Family Medicine

## 2015-02-15 ENCOUNTER — Ambulatory Visit (INDEPENDENT_AMBULATORY_CARE_PROVIDER_SITE_OTHER): Payer: Self-pay | Admitting: Family Medicine

## 2015-02-15 VITALS — BP 139/75 | HR 89 | Temp 98.4°F | Ht 70.0 in | Wt 244.0 lb

## 2015-02-15 DIAGNOSIS — E119 Type 2 diabetes mellitus without complications: Secondary | ICD-10-CM

## 2015-02-15 DIAGNOSIS — Z0000 Encounter for general adult medical examination without abnormal findings: Secondary | ICD-10-CM

## 2015-02-15 LAB — CBC WITH DIFFERENTIAL/PLATELET
BASOS ABS: 0 10*3/uL (ref 0.0–0.1)
BASOS PCT: 0.3 % (ref 0.0–3.0)
EOS ABS: 0.2 10*3/uL (ref 0.0–0.7)
EOS PCT: 2.4 % (ref 0.0–5.0)
HEMATOCRIT: 49.1 % (ref 39.0–52.0)
HEMOGLOBIN: 16.8 g/dL (ref 13.0–17.0)
LYMPHS ABS: 2.1 10*3/uL (ref 0.7–4.0)
Lymphocytes Relative: 32.1 % (ref 12.0–46.0)
MCHC: 34.2 g/dL (ref 30.0–36.0)
MCV: 100.8 fl — AB (ref 78.0–100.0)
MONO ABS: 0.4 10*3/uL (ref 0.1–1.0)
Monocytes Relative: 6 % (ref 3.0–12.0)
NEUTROS ABS: 4 10*3/uL (ref 1.4–7.7)
Neutrophils Relative %: 59.2 % (ref 43.0–77.0)
Platelets: 151 10*3/uL (ref 150.0–400.0)
RBC: 4.87 Mil/uL (ref 4.22–5.81)
RDW: 12.9 % (ref 11.5–15.5)
WBC: 6.7 10*3/uL (ref 4.0–10.5)

## 2015-02-15 LAB — BASIC METABOLIC PANEL
BUN: 8 mg/dL (ref 6–23)
CO2: 25 mEq/L (ref 19–32)
CREATININE: 0.69 mg/dL (ref 0.40–1.50)
Calcium: 9.4 mg/dL (ref 8.4–10.5)
Chloride: 102 mEq/L (ref 96–112)
GFR: 136.59 mL/min (ref >60.00)
Glucose, Bld: 160 mg/dL — ABNORMAL HIGH (ref 70–99)
Potassium: 4.4 mEq/L (ref 3.5–5.1)
SODIUM: 133 meq/L — AB (ref 135–145)

## 2015-02-15 LAB — HEPATIC FUNCTION PANEL
ALK PHOS: 68 U/L (ref 39–117)
ALT: 77 U/L — ABNORMAL HIGH (ref 0–53)
AST: 69 U/L — ABNORMAL HIGH (ref 0–37)
Albumin: 4.3 g/dL (ref 3.5–5.2)
Bilirubin, Direct: 0.1 mg/dL (ref 0.0–0.3)
TOTAL PROTEIN: 7.6 g/dL (ref 6.0–8.3)
Total Bilirubin: 0.8 mg/dL (ref 0.2–1.2)

## 2015-02-15 LAB — POCT URINALYSIS DIPSTICK
Bilirubin, UA: NEGATIVE
Blood, UA: NEGATIVE
Glucose, UA: NEGATIVE
Ketones, UA: NEGATIVE
Leukocytes, UA: NEGATIVE
Nitrite, UA: NEGATIVE
PH UA: 5
Protein, UA: NEGATIVE
SPEC GRAV UA: 1.025
Urobilinogen, UA: 0.2

## 2015-02-15 LAB — TSH: TSH: 1.23 u[IU]/mL (ref 0.35–4.50)

## 2015-02-15 LAB — GLUCOSE, POCT (MANUAL RESULT ENTRY): POC Glucose: 166 mg/dl — AB (ref 70–99)

## 2015-02-15 LAB — HEMOGLOBIN A1C: HEMOGLOBIN A1C: 7.3 % — AB (ref 4.6–6.5)

## 2015-02-15 LAB — LIPID PANEL
Cholesterol: 216 mg/dL — ABNORMAL HIGH (ref 0–200)
HDL: 41.9 mg/dL (ref >39.00)
LDL Cholesterol: 145 mg/dL — ABNORMAL HIGH (ref 0–99)
NONHDL: 174.1
Total CHOL/HDL Ratio: 5
Triglycerides: 146 mg/dL (ref 0.0–149.0)
VLDL: 29.2 mg/dL (ref 0.0–40.0)

## 2015-02-15 MED ORDER — LISINOPRIL 20 MG PO TABS: ORAL_TABLET | ORAL | Status: DC

## 2015-02-15 MED ORDER — GLYBURIDE 1.25 MG PO TABS: ORAL_TABLET | ORAL | Status: DC

## 2015-02-15 NOTE — Progress Notes (Signed)
   Subjective:    Patient ID: [name] [name], male    DOB: [birth year], 39 y.o.   MRN: [id number]  HPI 39 yr old male for a cpx. He feels well except for some constipation. He tries to eat plenty of fiber rich foods and drink water. His am fasting glucoses have been averaging 120 to 160 at home.    Review of Systems  Constitutional: Negative.   HENT: Negative.   Eyes: Negative.   Respiratory: Negative.   Cardiovascular: Negative.   Gastrointestinal: Positive for constipation. Negative for nausea, vomiting, abdominal pain, diarrhea, blood in stool, abdominal distention, anal bleeding and rectal pain.  Genitourinary: Negative.   Musculoskeletal: Negative.   Skin: Negative.   Neurological: Negative.   Psychiatric/Behavioral: Negative.        Objective:   Physical Exam  Constitutional: He is oriented to person, place, and time. He appears well-developed and well-nourished. No distress.  HENT:  Head: Normocephalic and atraumatic.  Right Ear: External ear normal.  Left Ear: External ear normal.  Nose: Nose normal.  Mouth/Throat: Oropharynx is clear and moist. No oropharyngeal exudate.  Eyes: Conjunctivae and EOM are normal. Pupils are equal, round, and reactive to light. Right eye exhibits no discharge. Left eye exhibits no discharge. No scleral icterus.  Neck: Neck supple. No JVD present. No tracheal deviation present. No thyromegaly present.  Cardiovascular: Normal rate, regular rhythm, normal heart sounds and intact distal pulses.  Exam reveals no gallop and no friction rub.   No murmur heard. Pulmonary/Chest: Effort normal and breath sounds normal. No respiratory distress. He has no wheezes. He has no rales. He exhibits no tenderness.  Abdominal: Soft. Bowel sounds are normal. He exhibits no distension and no mass. There is no tenderness. There is no rebound and no guarding.  Genitourinary: Rectum normal, prostate normal and penis normal. Guaiac negative stool. No penile  tenderness.  Musculoskeletal: Normal range of motion. He exhibits no edema or tenderness.  Lymphadenopathy:    He has no cervical adenopathy.  Neurological: He is alert and oriented to person, place, and time. He has normal reflexes. No cranial nerve deficit. He exhibits normal muscle tone. Coordination normal.  Skin: Skin is warm and dry. No rash noted. He is not diaphoretic. No erythema. No pallor.  Psychiatric: He has a normal mood and affect. His behavior is normal. Judgment and thought content normal.          Assessment & Plan:  Well exam. Get fasting labs. We discussed diet and exercise, and I urged him to lose some weight.

## 2015-02-15 NOTE — Progress Notes (Signed)
Pre visit review using our clinic review tool, if applicable. No additional management support is needed unless otherwise documented below in the visit note. 

## 2015-02-20 ENCOUNTER — Other Ambulatory Visit: Payer: Self-pay

## 2015-02-27 ENCOUNTER — Other Ambulatory Visit: Payer: Self-pay | Admitting: Family Medicine

## 2015-03-28 ENCOUNTER — Ambulatory Visit (INDEPENDENT_AMBULATORY_CARE_PROVIDER_SITE_OTHER): Payer: Self-pay | Admitting: Family Medicine

## 2015-03-28 ENCOUNTER — Encounter: Payer: Self-pay | Admitting: Family Medicine

## 2015-03-28 VITALS — BP 138/88 | HR 87 | Temp 98.2°F | Ht 70.0 in | Wt 241.0 lb

## 2015-03-28 DIAGNOSIS — S61219A Laceration without foreign body of unspecified finger without damage to nail, initial encounter: Secondary | ICD-10-CM

## 2015-03-28 DIAGNOSIS — Z23 Encounter for immunization: Secondary | ICD-10-CM

## 2015-03-28 DIAGNOSIS — E119 Type 2 diabetes mellitus without complications: Secondary | ICD-10-CM

## 2015-03-28 DIAGNOSIS — I10 Essential (primary) hypertension: Secondary | ICD-10-CM

## 2015-03-28 NOTE — Progress Notes (Signed)
Pre visit review using our clinic review tool, if applicable. No additional management support is needed unless otherwise documented below in the visit note. 

## 2015-03-28 NOTE — Progress Notes (Signed)
   Subjective:    Patient ID: [name] [name], male    DOB: 1976-06-29, 39 y.o.   MRN: [id number]  HPI Here to review his recent labs and to fill out a form for his work. He had a cpx a few weeks ago and had labs that were remarkable primarily for an A1c of 7.2. He says he has eliminated deserts and sweets from his diet, but he admits to eating a lot of bread daily. He is exercising. He works for the [employer]. He feels well. Also he asks [place] to check an injury to the left 5th finger. 3 days ago while working on is truck, his hand slipped and he rammed the head of a screwdriver into the pad of the finger. He stopped the bleeding that day with direct pressure and he has been wrapping the wound with Neosporin and gauze daily.   Review of Systems  Constitutional: Negative.   Respiratory: Negative.   Cardiovascular: Negative.   Gastrointestinal: Negative.   Skin: Positive for wound.  Neurological: Negative.        Objective:   Physical Exam  Constitutional: He appears well-developed and well-nourished.  Cardiovascular: Normal rate, regular rhythm, normal heart sounds and intact distal pulses.   Pulmonary/Chest: Effort normal and breath sounds normal.  Abdominal: Soft. Bowel sounds are normal.  Skin:  The pad of the left 5th finger has a punctate wound which has removed a piece of flesh. The wound looks clean.          Assessment & Plan:  He is doing well in general , and we filled out his form for work. He will continue to treat the finger wound as above. For the diabetes, I advised him to remove all bread from his diet.

## 2015-03-30 ENCOUNTER — Telehealth: Payer: Self-pay

## 2015-03-30 NOTE — Telephone Encounter (Signed)
Per Dr. Claris Che request called and left a message for pt that we have a form ready for pick up for patient.

## 2015-03-31 NOTE — Telephone Encounter (Signed)
Spoke with pt and pt states he has the original copy. The one ready for pick up will be scanned.

## 2015-11-30 ENCOUNTER — Ambulatory Visit (INDEPENDENT_AMBULATORY_CARE_PROVIDER_SITE_OTHER): Payer: Self-pay | Admitting: Family Medicine

## 2015-11-30 ENCOUNTER — Encounter: Payer: Self-pay | Admitting: Family Medicine

## 2015-11-30 ENCOUNTER — Ambulatory Visit (INDEPENDENT_AMBULATORY_CARE_PROVIDER_SITE_OTHER)
Admission: RE | Admit: 2015-11-30 | Discharge: 2015-11-30 | Disposition: A | Discharge status: Home / Self Care | Payer: Self-pay | Source: Ambulatory Visit | Attending: Family Medicine | Admitting: Family Medicine

## 2015-11-30 VITALS — BP 140/92 | HR 92 | Temp 98.7°F | Ht 70.0 in | Wt 239.2 lb

## 2015-11-30 DIAGNOSIS — J069 Acute upper respiratory infection, unspecified: Secondary | ICD-10-CM

## 2015-11-30 DIAGNOSIS — J209 Acute bronchitis, unspecified: Secondary | ICD-10-CM

## 2015-11-30 MED ORDER — BENZONATATE 100 MG PO CAPS: 100.0000 mg | ORAL_CAPSULE | Freq: Three times a day (TID) | ORAL | Status: DC | PRN

## 2015-11-30 MED ORDER — ALBUTEROL SULFATE HFA 108 (90 BASE) MCG/ACT IN AERS: 2.0000 | INHALATION_SPRAY | Freq: Four times a day (QID) | RESPIRATORY_TRACT | Status: DC | PRN

## 2015-11-30 NOTE — Patient Instructions (Signed)
Before you leave: -Schedule a follow-up visit to recheck your blood pressure in about 2-4 weeks -X-ray sheet  Go get the chest x-ray today.  Restart your blood pressure medications right away.  Afrin nasal spray twice daily for 5 days. Then stop. This is available over-the-counter.  Take Zyrtec or Claritin every day.  Flonase nasal spray 2 sprays each nostril daily for 3 weeks, then may do 1 spray each nostril daily for the remainder of allergy season.  Can use the Tessalon perles for cough as needed per instructions.  Can try the inhaler for the cough or wheezy feeling as needed per instructions as well if this is helpful.  Follow-up with your doctor immediately if your symptoms are worsening, you have new concerns or if your symptoms persist longer than expected.  INSTRUCTIONS FOR UPPER RESPIRATORY INFECTION:  -plenty of rest and fluids  -nasal saline wash 2-3 times daily (use prepackaged nasal saline or bottled/distilled water if making your own)   -can use AFRIN nasal spray for drainage and nasal congestion - but do NOT use longer then 3-4 days  -can use tylenol (in no history of liver disease) or ibuprofen (if no history of kidney disease, bowel bleeding or significant heart disease) as directed for aches and sorethroat  -in the winter time, using a humidifier at night is helpful (please follow cleaning instructions)  -if you are taking a cough medication - use only as directed, may also try a teaspoon of honey to coat the throat and throat lozenges. If given a cough medication with codeine or hydrocodone or other narcotic please be advised that this contains a strong and  potentially addicting medication. Please follow instructions carefully, take as little as possible and only use AS NEEDED for severe cough. Discuss potential side effects with your pharmacy. Please do not drive or operate machinery while taking these types of medications. Please do not take other sedating  medications, drugs or alcohol while taking this medication without discussing with your doctor.  -for sore throat, salt water gargles can help  -follow up if you have fevers, facial pain, tooth pain, difficulty breathing or are worsening or symptoms persist longer then expected  Upper Respiratory Infection, Adult An upper respiratory infection (URI) is also known as the common cold. It is often caused by a type of germ (virus). Colds are easily spread (contagious). You can pass it to others by kissing, coughing, sneezing, or drinking out of the same glass. Usually, you get better in 1 to 3  weeks.  However, the cough can last for even longer. HOME CARE   Only take medicine as told by your doctor. Follow instructions provided above.  Drink enough water and fluids to keep your pee (urine) clear or pale yellow.  Get plenty of rest.  Return to work when your temperature is < 100 for 24 hours or as told by your doctor. You may use a face mask and wash your hands to stop your cold from spreading. GET HELP RIGHT AWAY IF:   After the first few days, you feel you are getting worse.  You have questions about your medicine.  You have chills, shortness of breath, or red spit (mucus).  You have pain in the face for more then 1-2 days, especially when you bend forward.  You have a fever, puffy (swollen) neck, pain when you swallow, or white spots in the back of your throat.  You have a bad headache, ear pain, sinus pain, or chest pain.  You have a high-pitched whistling sound when you breathe in and out (wheezing).  You cough up blood.  You have sore muscles or a stiff neck. MAKE SURE YOU:   Understand these instructions.  Will watch your condition.  Will get help right away if you are not doing well or get worse. Document Released: 01/29/2008 Document Revised: 11/04/2011 Document Reviewed: 11/17/2013 [facility] Patient Information 2015 [place], [place]. This information is not intended to  replace advice given to you by your health care provider. Make sure you discuss any questions you have with your health care provider.

## 2015-11-30 NOTE — Progress Notes (Signed)
HPI:  [name] [name] is a pleasant 40 yo here for an acute visit for: Cough and congestion: -started: 4 days ago -symptoms:nasal congestion, tickle in throat that then causes a cough,  Cough, sneezing, occasionally feels that chest is tight or feels a little wheezy when he coughs or takes a deep breath -denies:fever, chest pain, SOB, NVD, tooth pain, sinus pain, body aches -has tried: Occasionally takes Zyrtec for his allergies, Mucinex -sick contacts/travel/risks: no reported flu, strep or tick exposure -Hx of: allergies  Elevated blood pressure: -Reports has not taken his blood pressure medications in 2 days, but will pick up today ROS: See pertinent positives and negatives per HPI.  Past Medical History  Diagnosis Date  . Hypertension   . Bipolar disorder [facility])     sees [facility]   . Headache(784.0)   . Diabetes mellitus without complication (HCC)   . Obesity 10/24/2012  . Alcohol abuse     No past surgical history on file.  Family History  Problem Relation Age of Onset  . Hypertension    . Cancer      lung  . Depression      Social History   Social History  . Marital Status: Single    Spouse Name: N/A  . Number of Children: N/A  . Years of Education: N/A   Social History Main Topics  . Smoking status: Current Every Day Smoker -- 1.00 packs/day    Types: Cigarettes  . Smokeless tobacco: Never Used  . Alcohol Use: 0.0 oz/week    0 Standard drinks or equivalent per week     Comment: occ  . Drug Use: No  . Sexual Activity: Not Asked   Other Topics Concern  . None   Social History Narrative     Current outpatient prescriptions:  .  Aspirin Effervescent (ALKA-SELTZER PO), Take by mouth., Disp: , Rfl:  .  cetirizine (ZYRTEC) 10 MG tablet, Take 10 mg by mouth daily., Disp: , Rfl:  .  DULoxetine (CYMBALTA) 30 MG capsule, Take 30 mg by mouth 2 (two) times daily., Disp: , Rfl:  .  fish oil-omega-3 fatty acids 1000 MG capsule, Take 2 g by  mouth daily., Disp: , Rfl:  .  glyBURIDE (DIABETA) 1.25 MG tablet, TAKE 1 TABLET BY MOUTH TWICE A DAY WITH A MEAL, Disp: 60 tablet, Rfl: 11 .  GuaiFENesin (MUCINEX PO), Take by mouth., Disp: , Rfl:  .  lisinopril (PRINIVIL,ZESTRIL) 20 MG tablet, TAKE 1 TABLET (20 MG TOTAL) BY MOUTH DAILY., Disp: 30 tablet, Rfl: 11 .  albuterol (PROVENTIL HFA;VENTOLIN HFA) 108 (90 Base) MCG/ACT inhaler, Inhale 2 puffs into the lungs every 6 (six) hours as needed., Disp: 1 Inhaler, Rfl: 0 .  benzonatate (TESSALON PERLES) 100 MG capsule, Take 1 capsule (100 mg total) by mouth 3 (three) times daily as needed., Disp: 20 capsule, Rfl: 0 .  [DISCONTINUED] metFORMIN (GLUCOPHAGE) 500 MG tablet, Take 1 tablet (500 mg total) by mouth 2 (two) times daily with a meal., Disp: 60 tablet, Rfl: 3  EXAM:  Filed Vitals:   11/30/15 1009  BP: 140/92  Pulse: 92  Temp: 98.7 F (37.1 C)    Body mass index is 34.32 kg/(m^2).  GENERAL: vitals reviewed and listed above, alert, oriented, appears well hydrated and in no acute distress  HEENT: atraumatic, conjunttiva clear, no obvious abnormalities on inspection of external nose and ears, normal appearance of ear canals and TMs, clear nasal congestion, mild post oropharyngeal erythema with PND,  no tonsillar edema or exudate, no sinus TTP  NECK: no obvious masses on inspection  LUNGS: clear to auscultation bilaterally, no wheezes, rales or rhonchi, good air movement, O2 sats normal on room air, no signs of respiratory distress.  CV: HRRR, no peripheral edema  MS: moves all extremities without noticeable abnormality  PSYCH: pleasant and cooperative, no obvious depression or anxiety  ASSESSMENT AND PLAN:  Discussed the following assessment and plan:  Acute upper respiratory infection - Plan: DG Chest 2 View  Acute bronchitis, unspecified organism - Plan: albuterol (PROVENTIL HFA;VENTOLIN HFA) 108 (90 Base) MCG/ACT inhaler  -given HPI and exam findings today, a serious  infection or illness is unlikely. We discussed potential etiologies, with VURI or allergic rhinitis with possible mild non-bacterial bronchitis being most likely. He is concerned about chest congestion and we will get an x-ray to rule out pneumonia and will treat accordingly if this is found.  Opted to increase his allergy regimen with INS and antihistamine, alb prn, cough medication prn. We discussed treatment side effects, likely course,  potential complications, antibiotic misuse, transmission if viral, and signs of developing a serious illness. -Advised that he restart his blood pressure medications right away in follow-up with his primary doctor for a recheck in a few weeks. -of course, we advised to return or notify a doctor immediately if symptoms worsen or persist or new concerns arise.    Patient Instructions  Before you leave: -Schedule a follow-up visit to recheck your blood pressure in about 2-4 weeks -X-ray sheet  Go get the chest x-ray today.  Restart your blood pressure medications right away.  Afrin nasal spray twice daily for 5 days. Then stop. This is available over-the-counter.  Take Zyrtec or Claritin every day.  Flonase nasal spray 2 sprays each nostril daily for 3 weeks, then may do 1 spray each nostril daily for the remainder of allergy season.  Can use the Tessalon perles for cough as needed per instructions.  Can try the inhaler for the cough or wheezy feeling as needed per instructions as well if this is helpful.  Follow-up with your doctor immediately if your symptoms are worsening, you have new concerns or if your symptoms persist longer than expected.  INSTRUCTIONS FOR UPPER RESPIRATORY INFECTION:  -plenty of rest and fluids  -nasal saline wash 2-3 times daily (use prepackaged nasal saline or bottled/distilled water if making your own)   -can use AFRIN nasal spray for drainage and nasal congestion - but do NOT use longer then 3-4 days  -can use tylenol  (in no history of liver disease) or ibuprofen (if no history of kidney disease, bowel bleeding or significant heart disease) as directed for aches and sorethroat  -in the winter time, using a humidifier at night is helpful (please follow cleaning instructions)  -if you are taking a cough medication - use only as directed, may also try a teaspoon of honey to coat the throat and throat lozenges. If given a cough medication with codeine or hydrocodone or other narcotic please be advised that this contains a strong and  potentially addicting medication. Please follow instructions carefully, take as little as possible and only use AS NEEDED for severe cough. Discuss potential side effects with your pharmacy. Please do not drive or operate machinery while taking these types of medications. Please do not take other sedating medications, drugs or alcohol while taking this medication without discussing with your doctor.  -for sore throat, salt water gargles can help  -follow up  if you have fevers, facial pain, tooth pain, difficulty breathing or are worsening or symptoms persist longer then expected  Upper Respiratory Infection, Adult An upper respiratory infection (URI) is also known as the common cold. It is often caused by a type of germ (virus). Colds are easily spread (contagious). You can pass it to others by kissing, coughing, sneezing, or drinking out of the same glass. Usually, you get better in 1 to 3  weeks.  However, the cough can last for even longer. HOME CARE   Only take medicine as told by your doctor. Follow instructions provided above.  Drink enough water and fluids to keep your pee (urine) clear or pale yellow.  Get plenty of rest.  Return to work when your temperature is < 100 for 24 hours or as told by your doctor. You may use a face mask and wash your hands to stop your cold from spreading. GET HELP RIGHT AWAY IF:   After the first few days, you feel you are getting worse.  You  have questions about your medicine.  You have chills, shortness of breath, or red spit (mucus).  You have pain in the face for more then 1-2 days, especially when you bend forward.  You have a fever, puffy (swollen) neck, pain when you swallow, or white spots in the back of your throat.  You have a bad headache, ear pain, sinus pain, or chest pain.  You have a high-pitched whistling sound when you breathe in and out (wheezing).  You cough up blood.  You have sore muscles or a stiff neck. MAKE SURE YOU:   Understand these instructions.  Will watch your condition.  Will get help right away if you are not doing well or get worse. Document Released: 01/29/2008 Document Revised: 11/04/2011 Document Reviewed: 11/17/2013 [facility]Exit[facility] Patient Information 2015 [place], [place]LL[place]. This information is not intended to replace advice given to you by your health care provider. Make sure you discuss any questions you have with your health care provider.        Kriste BasqueKIM, Reeves Musick R.

## 2015-11-30 NOTE — Progress Notes (Signed)
Pre visit review using our clinic review tool, if applicable. No additional management support is needed unless otherwise documented below in the visit note. 

## 2016-02-13 ENCOUNTER — Emergency Department (HOSPITAL_COMMUNITY): Payer: Self-pay

## 2016-02-13 ENCOUNTER — Emergency Department (HOSPITAL_COMMUNITY)
Admission: EM | Admit: 2016-02-13 | Discharge: 2016-02-13 | Disposition: A | Discharge status: Home / Self Care | Payer: Self-pay | Attending: Emergency Medicine | Admitting: Emergency Medicine

## 2016-02-13 ENCOUNTER — Encounter (HOSPITAL_COMMUNITY): Payer: Self-pay

## 2016-02-13 DIAGNOSIS — F17210 Nicotine dependence, cigarettes, uncomplicated: Secondary | ICD-10-CM | POA: Insufficient documentation

## 2016-02-13 DIAGNOSIS — E119 Type 2 diabetes mellitus without complications: Secondary | ICD-10-CM | POA: Insufficient documentation

## 2016-02-13 DIAGNOSIS — F319 Bipolar disorder, unspecified: Secondary | ICD-10-CM | POA: Insufficient documentation

## 2016-02-13 DIAGNOSIS — Z7984 Long term (current) use of oral hypoglycemic drugs: Secondary | ICD-10-CM | POA: Insufficient documentation

## 2016-02-13 DIAGNOSIS — Z79899 Other long term (current) drug therapy: Secondary | ICD-10-CM | POA: Insufficient documentation

## 2016-02-13 DIAGNOSIS — Z7982 Long term (current) use of aspirin: Secondary | ICD-10-CM | POA: Insufficient documentation

## 2016-02-13 DIAGNOSIS — I10 Essential (primary) hypertension: Secondary | ICD-10-CM | POA: Insufficient documentation

## 2016-02-13 DIAGNOSIS — R202 Paresthesia of skin: Secondary | ICD-10-CM | POA: Insufficient documentation

## 2016-02-13 NOTE — ED Notes (Signed)
Pt was arrested on Saturday night.  Pt was hard cuffed.  Pt states he mentioned cuffs were too tight.  After cuff removed, pt having numbness and pain to left wrist and hand.

## 2016-02-13 NOTE — ED Provider Notes (Signed)
CSN: [id number]     Arrival date & time 02/13/16  0808 History   First MD Initiated Contact with Patient 02/13/16 [phone]     Chief Complaint  Patient presents with  . Wrist Pain  . Hand Pain     Patient is a 40 y.o. male presenting with wrist pain and hand pain. The history is provided by the patient.  Wrist Pain This is a new problem. The current episode started more than 2 days ago. The problem occurs constantly. The problem has not changed since onset.Nothing aggravates the symptoms. Nothing relieves the symptoms.  Hand Pain  patient reports he was in handcuffs over 2 days ago and felt the handcuffs were too tight He reports his left hand and wrist have been numb since that time He has mild pain No weakness in left hand No other complaints   Past Medical History  Diagnosis Date  . Hypertension   . Bipolar disorder [facility])     sees [facility]   . Headache(784.0)   . Diabetes mellitus without complication (HCC)   . Obesity 10/24/2012  . Alcohol abuse    History reviewed. No pertinent past surgical history. Family History  Problem Relation Age of Onset  . Hypertension    . Cancer      lung  . Depression     Social History  Substance Use Topics  . Smoking status: Current Every Day Smoker -- 1.00 packs/day    Types: Cigarettes  . Smokeless tobacco: Never Used  . Alcohol Use: 0.0 oz/week    0 Standard drinks or equivalent per week     Comment: occ    Review of Systems  Musculoskeletal: Positive for arthralgias.  Neurological: Negative for numbness.      Allergies  Review of patient's allergies indicates no known allergies.  Home Medications   Prior to Admission medications   Medication Sig Start Date End Date Taking? Authorizing Provider  albuterol (PROVENTIL HFA;VENTOLIN HFA) 108 (90 Base) MCG/ACT inhaler Inhale 2 puffs into the lungs every 6 (six) hours as needed. 11/30/15   Terressa Koyanagi, DO  Aspirin Effervescent (ALKA-SELTZER PO) Take by mouth.     Historical Provider, MD  benzonatate (TESSALON PERLES) 100 MG capsule Take 1 capsule (100 mg total) by mouth 3 (three) times daily as needed. 11/30/15   Terressa Koyanagi, DO  cetirizine (ZYRTEC) 10 MG tablet Take 10 mg by mouth daily.    Historical Provider, MD  DULoxetine (CYMBALTA) 30 MG capsule Take 30 mg by mouth 2 (two) times daily.    Historical Provider, MD  fish oil-omega-3 fatty acids 1000 MG capsule Take 2 g by mouth daily.    Historical Provider, MD  glyBURIDE (DIABETA) 1.25 MG tablet TAKE 1 TABLET BY MOUTH TWICE A DAY WITH A MEAL 02/15/15   Nelwyn Salisbury, MD  GuaiFENesin (MUCINEX PO) Take by mouth.    Historical Provider, MD  lisinopril (PRINIVIL,ZESTRIL) 20 MG tablet TAKE 1 TABLET (20 MG TOTAL) BY MOUTH DAILY. 02/15/15   Nelwyn Salisbury, MD   BP 146/97 mmHg  Pulse 71  Temp(Src) 98.6 F (37 C) (Oral)  Resp 16  SpO2 100% Physical Exam CONSTITUTIONAL: Well developed/well nourished HEAD: Normocephalic/atraumatic ENMT: Mucous membranes moist NECK: supple no meningeal signs CV: S1/S2 noted LUNGS: Lungs are clear to auscultation bilaterally ABDOMEN: soft NEURO: Pt is awake/alert/appropriate, moves all extremitiesx4.  No facial droop.  He is ambulatory He has full strength with flex/extension of left wrist.  He makes  strong grip with left hand.  He moves all fingers without difficulty.  He reports sensation is decreased on left hand on both surfaces EXTREMITIES: pulses normal/equal, full ROM, no bruising/lacerations/abrasions noted.  No edema noted.  No visible trauma noted SKIN: warm, color normal PSYCH: no abnormalities of mood noted, alert and oriented to situation  ED Course  Procedures  Imaging Review Dg Wrist Complete Left  02/13/2016  CLINICAL DATA:  Left hand pain and numbness. EXAM: LEFT WRIST - COMPLETE 3+ VIEW COMPARISON:  None. FINDINGS: There is no evidence of fracture or dislocation. There is no evidence of arthropathy or other focal bone abnormality. Soft tissues are  unremarkable. IMPRESSION: Negative. Electronically Signed   By: Ted Mcalpineobrinka  Dimitrova M.D.   On: 02/13/2016 08:53   I have personally reviewed and evaluated these images results as part of my medical decision-making.  SPLINT APPLICATION Date/Time: 0930am Authorized by: Joya GaskinsWICKLINE,Kaedyn Belardo W Consent: Verbal consent obtained. Risks and benefits: risks, benefits and alternatives were discussed Consent given by: patient Splint applied by: orthopedic technician Location details: left wrist Splint type: velcro splint Supplies used: splint Post-procedure: The splinted body part was neurovascularly unchanged following the procedure. Patient tolerance: Patient tolerated the procedure well with no immediate complications.   Pt with continued paresthesias to left hand No motor weakness Refer to hand surgery  MDM   Final diagnoses:  Left hand paresthesia    Nursing notes including past medical history and social history reviewed and considered in documentation xrays/imaging reviewed by myself and considered during evaluation     Zadie Rhineonald Serenna Deroy, MD 02/13/16 1011

## 2016-02-13 NOTE — Discharge Instructions (Signed)
Paresthesia Paresthesia is an abnormal burning or prickling sensation. This sensation is generally felt in the hands, arms, legs, or feet. However, it may occur in any part of the body. Usually, it is not painful. The feeling may be described as:  Tingling or numbness.  Pins and needles.  Skin crawling.  Buzzing.  Limbs falling asleep.  Itching. Most people experience temporary (transient) paresthesia at some time in their lives. Paresthesia may occur when you breathe too quickly (hyperventilation). It can also occur without any apparent cause. Commonly, paresthesia occurs when pressure is placed on a nerve. The sensation quickly goes away after the pressure is removed. For some people, however, paresthesia is a long-lasting (chronic) condition that is caused by an underlying disorder. If you continue to have paresthesia, you may need further medical evaluation. HOME CARE INSTRUCTIONS Watch your condition for any changes. Taking the following actions may help to lessen any discomfort that you are feeling:  Avoid drinking alcohol.  Try acupuncture or massage to help relieve your symptoms.  Keep all follow-up visits as directed by your health care provider. This is important. SEEK MEDICAL CARE IF:  You continue to have episodes of paresthesia.  Your burning or prickling feeling gets worse when you walk.  You have pain, cramps, or dizziness.  You develop a rash. SEEK IMMEDIATE MEDICAL CARE IF:  You feel weak.  You have trouble walking or moving.  You have problems with speech, understanding, or vision.  You feel confused.  You cannot control your bladder or bowel movements.  You have numbness after an injury.  You faint.   This information is not intended to replace advice given to you by your health care provider. Make sure you discuss any questions you have with your health care provider.   Document Released: 08/02/2002 Document Revised: 12/27/2014 Document Reviewed:  08/08/2014 Elsevier Interactive Patient Education 2016 Elsevier Inc.  

## 2016-02-27 ENCOUNTER — Other Ambulatory Visit: Payer: Self-pay | Admitting: Family Medicine

## 2016-03-24 ENCOUNTER — Other Ambulatory Visit: Payer: Self-pay | Admitting: Family Medicine

## 2016-07-01 ENCOUNTER — Other Ambulatory Visit: Payer: Self-pay | Admitting: Family Medicine

## 2016-07-23 ENCOUNTER — Other Ambulatory Visit: Payer: Self-pay | Admitting: Family Medicine

## 2016-07-27 ENCOUNTER — Other Ambulatory Visit: Payer: Self-pay | Admitting: Family Medicine

## 2016-10-24 ENCOUNTER — Telehealth: Payer: Self-pay | Admitting: Family Medicine

## 2016-10-24 NOTE — Telephone Encounter (Signed)
Pt needed refills but also needs to be seen. Scheduled pt an appt.  Nothing further needed.

## 2016-10-25 ENCOUNTER — Telehealth: Payer: Self-pay | Admitting: Family Medicine

## 2016-10-25 ENCOUNTER — Ambulatory Visit (INDEPENDENT_AMBULATORY_CARE_PROVIDER_SITE_OTHER): Payer: Self-pay | Admitting: Family Medicine

## 2016-10-25 ENCOUNTER — Encounter: Payer: Self-pay | Admitting: Family Medicine

## 2016-10-25 VITALS — BP 154/106 | HR 90 | Temp 98.3°F | Ht 70.0 in | Wt 227.0 lb

## 2016-10-25 DIAGNOSIS — I10 Essential (primary) hypertension: Secondary | ICD-10-CM

## 2016-10-25 DIAGNOSIS — E119 Type 2 diabetes mellitus without complications: Secondary | ICD-10-CM

## 2016-10-25 LAB — CBC WITH DIFFERENTIAL/PLATELET
BASOS ABS: 0 10*3/uL (ref 0.0–0.1)
Basophils Relative: 0.4 % (ref 0.0–3.0)
Eosinophils Absolute: 0.2 10*3/uL (ref 0.0–0.7)
Eosinophils Relative: 2.4 % (ref 0.0–5.0)
HCT: 51.6 % (ref 39.0–52.0)
Hemoglobin: 17.9 g/dL — ABNORMAL HIGH (ref 13.0–17.0)
LYMPHS ABS: 1.8 10*3/uL (ref 0.7–4.0)
Lymphocytes Relative: 27.5 % (ref 12.0–46.0)
MCHC: 34.8 g/dL (ref 30.0–36.0)
MCV: 101.4 fl — AB (ref 78.0–100.0)
MONO ABS: 0.6 10*3/uL (ref 0.1–1.0)
Monocytes Relative: 9.5 % (ref 3.0–12.0)
NEUTROS ABS: 4 10*3/uL (ref 1.4–7.7)
NEUTROS PCT: 60.2 % (ref 43.0–77.0)
PLATELETS: 142 10*3/uL — AB (ref 150.0–400.0)
RBC: 5.09 Mil/uL (ref 4.22–5.81)
RDW: 12.9 % (ref 11.5–15.5)
WBC: 6.6 10*3/uL (ref 4.0–10.5)

## 2016-10-25 LAB — POC URINALSYSI DIPSTICK (AUTOMATED)
BILIRUBIN UA: NEGATIVE
Blood, UA: NEGATIVE
KETONES UA: NEGATIVE
Leukocytes, UA: NEGATIVE
Nitrite, UA: NEGATIVE
PH UA: 5.5
SPEC GRAV UA: 1.02
Urobilinogen, UA: 2

## 2016-10-25 LAB — BASIC METABOLIC PANEL
BUN: 7 mg/dL (ref 6–23)
CHLORIDE: 102 meq/L (ref 96–112)
CO2: 25 meq/L (ref 19–32)
CREATININE: 0.65 mg/dL (ref 0.40–1.50)
Calcium: 9.3 mg/dL (ref 8.4–10.5)
GFR: 145.04 mL/min (ref >60.00)
GLUCOSE: 286 mg/dL — AB (ref 70–99)
Potassium: 4.2 mEq/L (ref 3.5–5.1)
Sodium: 134 mEq/L — ABNORMAL LOW (ref 135–145)

## 2016-10-25 LAB — HEPATIC FUNCTION PANEL
ALT: 27 U/L (ref 0–53)
AST: 20 U/L (ref 0–37)
Albumin: 4.3 g/dL (ref 3.5–5.2)
Alkaline Phosphatase: 87 U/L (ref 39–117)
Bilirubin, Direct: 0.1 mg/dL (ref 0.0–0.3)
TOTAL PROTEIN: 7.4 g/dL (ref 6.0–8.3)
Total Bilirubin: 0.5 mg/dL (ref 0.2–1.2)

## 2016-10-25 LAB — LIPID PANEL
Cholesterol: 224 mg/dL — ABNORMAL HIGH (ref 0–200)
HDL: 36.1 mg/dL — ABNORMAL LOW (ref >39.00)
NonHDL: 187.97
Total CHOL/HDL Ratio: 6
Triglycerides: 343 mg/dL — ABNORMAL HIGH (ref 0.0–149.0)
VLDL: 68.6 mg/dL — ABNORMAL HIGH (ref 0.0–40.0)

## 2016-10-25 LAB — HEMOGLOBIN A1C: HEMOGLOBIN A1C: 9.9 % — AB (ref 4.6–6.5)

## 2016-10-25 LAB — TSH: TSH: 1.09 u[IU]/mL (ref 0.35–4.50)

## 2016-10-25 LAB — LDL CHOLESTEROL, DIRECT: Direct LDL: 149 mg/dL

## 2016-10-25 MED ORDER — PIOGLITAZONE HCL 15 MG PO TABS: 15.0000 mg | ORAL_TABLET | Freq: Every day | ORAL | 3 refills | Status: DC

## 2016-10-25 NOTE — Progress Notes (Signed)
   Subjective:    Patient ID: [name] [name], male    DOB: 03/08/1976, 41 y.o.   MRN: [id number]  HPI Here to follow up on DM and HTN. He has felt well and his BP is stable. He has had trouble with his blood glucose dropping however, even on a low dose of Glyburide. He did not tolerate Metformin due to muscle aches.    Review of Systems  Constitutional: Negative.   Respiratory: Negative.   Cardiovascular: Negative.   Neurological: Negative.        Objective:   Physical Exam  Constitutional: He is oriented to person, place, and time. He appears well-developed and well-nourished.  Cardiovascular: Normal rate, regular rhythm, normal heart sounds and intact distal pulses.   Pulmonary/Chest: Effort normal and breath sounds normal.  Neurological: He is alert and oriented to person, place, and time.          Assessment & Plan:  His HTN is stable. Get fasting labs today to include an A1c. We will stop Glyburide and try Actos 15 mg daily.  Gershon CraneStephen Mitesh Rosendahl, MD

## 2016-10-25 NOTE — Telephone Encounter (Signed)
Pt need new Rx for lisinopril   Pharm:  [facility] [place].

## 2016-10-25 NOTE — Telephone Encounter (Signed)
Pt following up on this request.  lisinopril (PRINIVIL,ZESTRIL) 20 MG tablet  [facility]  Pt saw the dr today.

## 2016-10-28 MED ORDER — LISINOPRIL 20 MG PO TABS: 20.0000 mg | ORAL_TABLET | Freq: Every day | ORAL | 3 refills | Status: DC

## 2016-10-28 NOTE — Addendum Note (Signed)
Addended by: Aniceto BossNIMMONS, Countess Biebel A on: 10/28/2016 10:27 AM   Modules accepted: Orders

## 2016-10-28 NOTE — Telephone Encounter (Signed)
[facility] 

## 2016-11-12 ENCOUNTER — Other Ambulatory Visit: Payer: Self-pay | Admitting: Family Medicine

## 2017-03-17 ENCOUNTER — Encounter: Payer: Self-pay | Admitting: Family Medicine

## 2017-03-17 ENCOUNTER — Ambulatory Visit (INDEPENDENT_AMBULATORY_CARE_PROVIDER_SITE_OTHER): Payer: Self-pay | Admitting: Family Medicine

## 2017-03-17 VITALS — BP 134/88 | HR 93 | Temp 98.2°F | Ht 70.0 in | Wt 228.0 lb

## 2017-03-17 DIAGNOSIS — E119 Type 2 diabetes mellitus without complications: Secondary | ICD-10-CM

## 2017-03-17 DIAGNOSIS — I10 Essential (primary) hypertension: Secondary | ICD-10-CM

## 2017-03-17 MED ORDER — METFORMIN HCL 500 MG PO TABS: 500.0000 mg | ORAL_TABLET | Freq: Two times a day (BID) | ORAL | 3 refills | Status: DC

## 2017-03-17 NOTE — Progress Notes (Signed)
   Subjective:    Patient ID: Tanya NonesJeremy Scott Waterhouse, male    DOB: 07/23/1976, 41 y.o.   MRN: [id number]  HPI Here for rising blood glucoses. He has been taking Actos 15 mg once a day for 6 months or so. His last A1c in March had jumped up to 9.9. He has made some dramatic changes in his diet and has almost eliminated all carbs. He went to a [facility] ER on 7-19 18 for glucoses as high as 384. They gave him IV fluids and tol dhim to see us. They did not change any of his meds. For the past 2 days his fasting glucoses have come down a bit to the 250 range. He describes numbness and tingling in the feet. His other labs at the ER were normal including his renal function. We reviewed all notes and tests results together today.    Review of Systems  Constitutional: Negative.   Respiratory: Negative.   Cardiovascular: Negative.   Neurological: Positive for numbness.       Objective:   Physical Exam  Constitutional: He is oriented to person, place, and time. He appears well-developed and well-nourished.  Cardiovascular: Normal rate, regular rhythm, normal heart sounds and intact distal pulses.   Pulmonary/Chest: Effort normal and breath sounds normal.  Neurological: He is alert and oriented to person, place, and time.          Assessment & Plan:  His diabetes is not well controlled. We will add metformin 500 mg bid to the Actos. He will follow up in one week. We will recheck an A1c at that time.  Gershon CraneStephen Makynzee Tigges, MD

## 2017-03-17 NOTE — Patient Instructions (Signed)
WE NOW OFFER   [name] FAST TRACK!!!  SAME DAY Appointments for ACUTE CARE  Such as: Sprains, Injuries, cuts, abrasions, rashes, muscle pain, joint pain, back pain Colds, flu, sore throats, headache, allergies, cough, fever  Ear pain, sinus and eye infections Abdominal pain, nausea, vomiting, diarrhea, upset stomach Animal/insect bites  3 Easy Ways to Schedule: Walk-In Scheduling Call in scheduling Mychart Sign-up: [email or web address]         

## 2017-05-02 ENCOUNTER — Telehealth: Payer: Self-pay | Admitting: Family Medicine

## 2017-05-02 NOTE — Telephone Encounter (Signed)
Patient is calling stating he is in situation where he cannot see his mental health doctor for 2 weeks and thought he had refills on his Cymbalta.  Patient states that Dr. Clent RidgesFry advised if he had trouble getting in to the Mental Health provider that he could call and get an Rx to get him through until he was able to be seen.  Patient is out of refills at this time.

## 2017-05-05 NOTE — Telephone Encounter (Signed)
Call in Cymbalta 30 mg to take 2 capsules in the am and one in the pm, #90 with no rf

## 2017-05-06 MED ORDER — DULOXETINE HCL 30 MG PO CPEP: 30.0000 mg | ORAL_CAPSULE | Freq: Two times a day (BID) | ORAL | 0 refills | Status: DC

## 2017-05-06 MED ORDER — DULOXETINE HCL 30 MG PO CPEP: ORAL_CAPSULE | ORAL | 0 refills | Status: DC

## 2017-05-06 NOTE — Telephone Encounter (Signed)
[facility], tried to reach pt and no answer.

## 2017-05-15 ENCOUNTER — Encounter: Payer: Self-pay | Admitting: Family Medicine

## 2017-05-20 ENCOUNTER — Telehealth: Payer: Self-pay | Admitting: Family Medicine

## 2017-05-20 DIAGNOSIS — E119 Type 2 diabetes mellitus without complications: Secondary | ICD-10-CM

## 2017-05-20 MED ORDER — PIOGLITAZONE HCL 30 MG PO TABS: 30.0000 mg | ORAL_TABLET | Freq: Every day | ORAL | 2 refills | Status: DC

## 2017-05-20 NOTE — Telephone Encounter (Signed)
Okay, call in Actos 30 mg daily, #30 with 2 rf

## 2017-05-20 NOTE — Telephone Encounter (Signed)
Pt is aware of above msg

## 2017-05-20 NOTE — Telephone Encounter (Signed)
I spoke with pt, he has stopped the Metformin it was making him sick, he has been taking 2 of the Actos every day for a total of 30 mg per day. He is out now and will schedule a office visit ASAP. [facility] [place]?

## 2017-05-20 NOTE — Addendum Note (Signed)
Addended by: Aniceto Boss A on: 05/20/2017 04:56 PM   Modules accepted: Orders

## 2017-05-20 NOTE — Telephone Encounter (Signed)
[facility], tried to reach pt and mail box was full.

## 2017-05-20 NOTE — Telephone Encounter (Signed)
Actually he has only been given Actos 15 mg daily. That is what is in the computer

## 2017-05-20 NOTE — Telephone Encounter (Signed)
[facility], pt stated he takes 30 mg of Actos, can you clarify? Also will need to send in new script if there was a change.

## 2017-08-06 ENCOUNTER — Other Ambulatory Visit: Payer: Self-pay | Admitting: Family Medicine

## 2017-10-09 ENCOUNTER — Ambulatory Visit: Payer: Self-pay | Admitting: Family Medicine

## 2017-10-13 ENCOUNTER — Encounter: Payer: Self-pay | Admitting: Family Medicine

## 2017-10-13 ENCOUNTER — Ambulatory Visit: Payer: Self-pay | Admitting: Family Medicine

## 2017-10-13 VITALS — BP 178/90 | HR 102 | Temp 98.2°F | Ht 70.0 in | Wt 241.4 lb

## 2017-10-13 DIAGNOSIS — E119 Type 2 diabetes mellitus without complications: Secondary | ICD-10-CM

## 2017-10-13 LAB — POCT GLYCOSYLATED HEMOGLOBIN (HGB A1C): HEMOGLOBIN A1C: 8.9

## 2017-10-13 MED ORDER — GLYBURIDE MICRONIZED 1.5 MG PO TABS: 1.5000 mg | ORAL_TABLET | Freq: Every day | ORAL | 11 refills | Status: DC

## 2017-10-13 MED ORDER — LISINOPRIL 20 MG PO TABS: 20.0000 mg | ORAL_TABLET | Freq: Every day | ORAL | 3 refills | Status: DC

## 2017-10-13 MED ORDER — PIOGLITAZONE HCL 30 MG PO TABS: 30.0000 mg | ORAL_TABLET | Freq: Every day | ORAL | 11 refills | Status: DC

## 2017-10-13 NOTE — Progress Notes (Signed)
   Subjective:    Patient ID: [name] NonesJeremy Scott [name], male    DOB: 01/26/1976, 42 y.o.   MRN: [id number]  HPI Here to follow up on type 2 diabetes. At our last visit we added Metformin to the Actos, but he had to stop the Metformin due to headaches. His fasting glucoses have been in the low 200s mostly. He feels well.    Review of Systems  Constitutional: Negative.   Respiratory: Negative.   Cardiovascular: Negative.   Neurological: Negative.        Objective:   Physical Exam  Constitutional: He is oriented to person, place, and time. He appears well-developed and well-nourished.  Cardiovascular: Normal rate, regular rhythm, normal heart sounds and intact distal pulses.  Pulmonary/Chest: Effort normal and breath sounds normal. No respiratory distress. He has no wheezes. He has no rales.  Neurological: He is alert and oriented to person, place, and time.          Assessment & Plan:  His diabetes is not well controlled. We will add Glyburide 1.5 mg daily to the Actos. He will report back in 2 weeks.  Gershon CraneStephen Fry, MD

## 2018-04-23 ENCOUNTER — Telehealth: Payer: Self-pay

## 2018-04-23 NOTE — Telephone Encounter (Signed)
Fax from [facility]   Refill duloxetine 30 MG   Last OV 10/13/2017   Last refilled 05/06/2017 disp 90 [facility]   Sent to PCP to advise

## 2018-04-24 MED ORDER — DULOXETINE HCL 30 MG PO CPEP: 30.0000 mg | ORAL_CAPSULE | Freq: Two times a day (BID) | ORAL | 3 refills | Status: DC

## 2018-04-24 MED ORDER — DULOXETINE HCL 30 MG PO CPEP: ORAL_CAPSULE | ORAL | 3 refills | Status: DC

## 2018-04-24 NOTE — Telephone Encounter (Signed)
Call in Duloxetine 30 mg to take 2 caps in the morning (60 mg) and one (30 mg) in the evenings, #90 with 11 rf

## 2018-04-24 NOTE — Addendum Note (Signed)
Addended by: Gracelyn NurseBLACKWELL, SHELBY P on: 04/24/2018 04:29 PM   Modules accepted: Orders

## 2018-04-24 NOTE — Addendum Note (Signed)
Addended by: Gracelyn NurseBLACKWELL, SHELBY P on: 04/24/2018 04:26 PM   Modules accepted: Orders

## 2018-04-24 NOTE — Telephone Encounter (Signed)
Rx has been called into pt's pharmacy.  

## 2018-11-14 ENCOUNTER — Other Ambulatory Visit: Payer: Self-pay | Admitting: Family Medicine

## 2018-12-07 ENCOUNTER — Encounter: Payer: Self-pay | Admitting: *Deleted

## 2019-01-16 ENCOUNTER — Other Ambulatory Visit: Payer: Self-pay | Admitting: Family Medicine

## 2019-02-05 ENCOUNTER — Other Ambulatory Visit: Payer: Self-pay | Admitting: Family Medicine

## 2019-05-14 ENCOUNTER — Ambulatory Visit (INDEPENDENT_AMBULATORY_CARE_PROVIDER_SITE_OTHER): Payer: Self-pay | Admitting: Family Medicine

## 2019-05-14 ENCOUNTER — Encounter: Payer: Self-pay | Admitting: Family Medicine

## 2019-05-14 ENCOUNTER — Other Ambulatory Visit: Payer: Self-pay

## 2019-05-14 VITALS — BP 120/70 | HR 98 | Temp 97.9°F | Ht 70.0 in | Wt 229.0 lb

## 2019-05-14 DIAGNOSIS — F411 Generalized anxiety disorder: Secondary | ICD-10-CM

## 2019-05-14 DIAGNOSIS — I10 Essential (primary) hypertension: Secondary | ICD-10-CM

## 2019-05-14 DIAGNOSIS — F319 Bipolar disorder, unspecified: Secondary | ICD-10-CM

## 2019-05-14 DIAGNOSIS — E119 Type 2 diabetes mellitus without complications: Secondary | ICD-10-CM

## 2019-05-14 LAB — POCT GLYCOSYLATED HEMOGLOBIN (HGB A1C): Hemoglobin A1C: 9 % — AB (ref 4.0–5.6)

## 2019-05-14 MED ORDER — ALPRAZOLAM 0.5 MG PO TABS: 0.5000 mg | ORAL_TABLET | Freq: Two times a day (BID) | ORAL | 0 refills | Status: DC | PRN

## 2019-05-14 MED ORDER — DOXYCYCLINE HYCLATE 100 MG PO TABS: 100.0000 mg | ORAL_TABLET | Freq: Two times a day (BID) | ORAL | 0 refills | Status: DC

## 2019-05-14 NOTE — Patient Instructions (Signed)
Health Maintenance Due  Topic Date Due  . PNEUMOCOCCAL POLYSACCHARIDE VACCINE AGE 42-64 HIGH RISK  05/28/1979  . FOOT EXAM  05/28/1987  . OPHTHALMOLOGY EXAM  05/28/1987  . HIV Screening  05/27/1992  . HEMOGLOBIN A1C  04/12/2018  . INFLUENZA VACCINE  03/27/2019    No flowsheet data found.

## 2019-05-14 NOTE — Progress Notes (Signed)
   Subjective:    Patient ID: [name] [name], male    DOB: 06-08-1976, 43 y.o.   MRN: [id number]  HPI Here to discuss several issues. First he has had trouble regulating his blood glucoses. He says he only feels comfortable when the glucose is in the range of 180 to 220. Any time his glucose drops below 180 he feels terrible, he gets shaky, sweaty, nervous, and extremely anxious. He almost feels like he will get a panic attack. So he immediately consumes something sweet to get the glucose back up again. His A1c today is 9.0, almost exactly what is was here a year ago. He tries to watch his diet and he only drinks sugar free Gatorade (10 calories per bottle) or water. In general he says his anxiety levels are quite high almost every day, and the slightest thing can almost cause him to have a panic attack. He sees [facility], and he is taking Cymbalta 60 mg in the am and 30 mg in the pm. He tried Hydroxyzine but is too sedating for him to work or drive. The clinic has made it clear they will never prescribe a benzodiazepine for him. He has used Xanax in the past for situational anxiety with good results, and he asks if he can try this again.    Review of Systems  Constitutional: Negative.   Respiratory: Negative.   Cardiovascular: Negative.   Neurological: Negative.   Psychiatric/Behavioral: Negative for agitation, confusion, decreased concentration and dysphoric mood. The patient is nervous/anxious.        Objective:   Physical Exam Constitutional:      Appearance: Normal appearance. He is not ill-appearing.  Cardiovascular:     Rate and Rhythm: Normal rate and regular rhythm.     Pulses: Normal pulses.     Heart sounds: Normal heart sounds.  Pulmonary:     Effort: Pulmonary effort is normal.     Breath sounds: Normal breath sounds.  Neurological:     General: No focal deficit present.     Mental Status: He is alert and oriented to person, place, and time.   Psychiatric:        Mood and Affect: Mood normal.        Behavior: Behavior normal.        Thought Content: Thought content normal.        Judgment: Judgment normal.           Assessment & Plan:  His diabetes is not well controlled, but he has intense reactions whenever his glucose approaches a normal range. There is no doubt that his anxiety is playing a large role in this. We discussed his psychiatric care and he asks if we can take over this part of his care instead of Mental Health. I agreed to do this, and at his next visit with them in a month he plans to tell them this. We will try a small supply of Xanax 0.5 mg to use as needed for anxiety. I stressed that we need to be careful with this since it can be habit forming, especially for someone with a hx of alcohol abuse. He understands this, and he said he would never take it every day. We will follow up in 4 weeks.  Alysia Penna, MD

## 2019-05-17 ENCOUNTER — Other Ambulatory Visit: Payer: Self-pay | Admitting: Family Medicine

## 2019-05-17 MED ORDER — PIOGLITAZONE HCL 30 MG PO TABS: 30.0000 mg | ORAL_TABLET | Freq: Every day | ORAL | 0 refills | Status: DC

## 2019-05-17 MED ORDER — GLYBURIDE MICRONIZED 1.5 MG PO TABS: ORAL_TABLET | ORAL | 0 refills | Status: DC

## 2019-05-17 MED ORDER — LISINOPRIL 20 MG PO TABS: 20.0000 mg | ORAL_TABLET | Freq: Every day | ORAL | 0 refills | Status: DC

## 2019-05-17 NOTE — Telephone Encounter (Signed)
Medication Refill - Medication:  lisinopril (PRINIVIL,ZESTRIL) 20 MG tablet [[id number]]  pioglitazone (ACTOS) 30 MG tablet [[id number]] glyBURIDE micronized (GLYNASE) 1.5 MG tablet [[id number]]   Has the patient contacted their pharmacy? No. (Agent: If no, request that the patient contact the pharmacy for the refill.) (Agent: If yes, when and what did the pharmacy advise?)  Preferred Pharmacy (with phone number or street name):  [place], [place] - [address] [phone] (Phone)     Agent: Please be advised that RX refills may take up to 3 business days. We ask that you follow-up with your pharmacy.

## 2019-06-22 ENCOUNTER — Telehealth: Payer: Self-pay | Admitting: Family Medicine

## 2019-06-22 NOTE — Telephone Encounter (Signed)
doxycycline (VIBRA-TABS) 100 MG tablet    Patient requesting refill of this medication. He states the boil on the back of his neck has not gone away and inquired if it were possible to have another [facility] [address], [place] - [address] [phone] (Phone) [phone] (Fax)

## 2019-06-24 ENCOUNTER — Other Ambulatory Visit: Payer: Self-pay

## 2019-06-24 MED ORDER — DOXYCYCLINE HYCLATE 100 MG PO TABS: 100.0000 mg | ORAL_TABLET | Freq: Two times a day (BID) | ORAL | 0 refills | Status: DC

## 2019-06-24 NOTE — Telephone Encounter (Signed)
Rx sent to pharmacy. Tried to call pt no answer.

## 2019-06-24 NOTE — Addendum Note (Signed)
Addended by: Gwenyth Ober R on: 06/24/2019 03:02 PM   Modules accepted: Orders

## 2019-06-24 NOTE — Telephone Encounter (Signed)
Please advise 

## 2019-06-24 NOTE — Telephone Encounter (Signed)
Call in Doxycycline 100 mg bid #60

## 2019-09-21 ENCOUNTER — Other Ambulatory Visit: Payer: Self-pay | Admitting: Family Medicine

## 2020-01-18 ENCOUNTER — Telehealth: Payer: Self-pay | Admitting: Family Medicine

## 2020-01-18 ENCOUNTER — Other Ambulatory Visit: Payer: Self-pay | Admitting: Family Medicine

## 2020-01-18 MED ORDER — PIOGLITAZONE HCL 30 MG PO TABS: 30.0000 mg | ORAL_TABLET | Freq: Every day | ORAL | 0 refills | Status: DC

## 2020-01-18 MED ORDER — GLYBURIDE MICRONIZED 1.5 MG PO TABS: 1.5000 mg | ORAL_TABLET | Freq: Every day | ORAL | 0 refills | Status: DC

## 2020-01-18 MED ORDER — LISINOPRIL 20 MG PO TABS: 20.0000 mg | ORAL_TABLET | Freq: Every day | ORAL | 0 refills | Status: DC

## 2020-01-18 NOTE — Telephone Encounter (Signed)
Rx sent in. Left a detailed message on verified voice mail.   

## 2020-01-18 NOTE — Addendum Note (Signed)
Addended by: Solon Augusta on: 01/18/2020 04:30 PM   Modules accepted: Orders

## 2020-01-18 NOTE — Telephone Encounter (Signed)
Pt is calling in stating that he received a text saying that his glyburide micronized 1.[facility] [address]  Pt stated that he will call [facility] to check to see if they have his Rx's and give [place] a all back if they do not.

## 2020-01-18 NOTE — Telephone Encounter (Addendum)
Pt needs the medications to go to  [facility] [address]  FAX: [phone]   Medication: Lisinopril  Pioglitazone Glyburide

## 2020-01-18 NOTE — Telephone Encounter (Signed)
Pt is calling in stating that he is out of the following Rx's glyburide micronized 1.5 MG, lisinopril 20 MG and pioglitazone 30 MG  [facility] [address]

## 2020-01-19 NOTE — Telephone Encounter (Signed)
Medications have been sent in. Patient is aware.

## 2020-01-26 ENCOUNTER — Other Ambulatory Visit: Payer: Self-pay

## 2020-01-27 ENCOUNTER — Ambulatory Visit: Payer: Self-pay | Admitting: Family Medicine

## 2020-01-27 ENCOUNTER — Encounter: Payer: Self-pay | Admitting: Family Medicine

## 2020-01-27 VITALS — BP 130/80 | HR 100 | Temp 97.8°F | Ht 70.0 in | Wt 227.6 lb

## 2020-01-27 DIAGNOSIS — L659 Nonscarring hair loss, unspecified: Secondary | ICD-10-CM

## 2020-01-27 DIAGNOSIS — L723 Sebaceous cyst: Secondary | ICD-10-CM

## 2020-01-27 DIAGNOSIS — Z0000 Encounter for general adult medical examination without abnormal findings: Secondary | ICD-10-CM

## 2020-01-27 DIAGNOSIS — L708 Other acne: Secondary | ICD-10-CM

## 2020-01-27 DIAGNOSIS — E119 Type 2 diabetes mellitus without complications: Secondary | ICD-10-CM

## 2020-01-27 LAB — HEMOGLOBIN A1C: Hgb A1c MFr Bld: 9.4 % — ABNORMAL HIGH (ref 4.6–6.5)

## 2020-01-27 LAB — CBC WITH DIFFERENTIAL/PLATELET
Basophils Absolute: 0 10*3/uL (ref 0.0–0.1)
Basophils Relative: 0.3 % (ref 0.0–3.0)
Eosinophils Absolute: 0.2 10*3/uL (ref 0.0–0.7)
Eosinophils Relative: 1.8 % (ref 0.0–5.0)
HCT: 48.6 % (ref 39.0–52.0)
Hemoglobin: 16.9 g/dL (ref 13.0–17.0)
Lymphocytes Relative: 21.6 % (ref 12.0–46.0)
Lymphs Abs: 1.8 10*3/uL (ref 0.7–4.0)
MCHC: 34.8 g/dL (ref 30.0–36.0)
MCV: 103.6 fl — ABNORMAL HIGH (ref 78.0–100.0)
Monocytes Absolute: 0.6 10*3/uL (ref 0.1–1.0)
Monocytes Relative: 7.1 % (ref 3.0–12.0)
Neutro Abs: 5.7 10*3/uL (ref 1.4–7.7)
Neutrophils Relative %: 69.2 % (ref 43.0–77.0)
Platelets: 145 10*3/uL — ABNORMAL LOW (ref 150.0–400.0)
RBC: 4.69 Mil/uL (ref 4.22–5.81)
RDW: 12.6 % (ref 11.5–15.5)
WBC: 8.3 10*3/uL (ref 4.0–10.5)

## 2020-01-27 LAB — BASIC METABOLIC PANEL
BUN: 11 mg/dL (ref 6–23)
CO2: 29 mEq/L (ref 19–32)
Calcium: 9.5 mg/dL (ref 8.4–10.5)
Chloride: 95 mEq/L — ABNORMAL LOW (ref 96–112)
Creatinine, Ser: 0.67 mg/dL (ref 0.40–1.50)
GFR: 129.67 mL/min (ref >60.00)
Glucose, Bld: 311 mg/dL — ABNORMAL HIGH (ref 70–99)
Potassium: 4.4 mEq/L (ref 3.5–5.1)
Sodium: 132 mEq/L — ABNORMAL LOW (ref 135–145)

## 2020-01-27 LAB — TSH: TSH: 1.95 u[IU]/mL (ref 0.35–4.50)

## 2020-01-27 LAB — HEPATIC FUNCTION PANEL
ALT: 33 U/L (ref 0–53)
AST: 24 U/L (ref 0–37)
Albumin: 4.3 g/dL (ref 3.5–5.2)
Alkaline Phosphatase: 93 U/L (ref 39–117)
Bilirubin, Direct: 0.2 mg/dL (ref 0.0–0.3)
Total Bilirubin: 0.8 mg/dL (ref 0.2–1.2)
Total Protein: 7.3 g/dL (ref 6.0–8.3)

## 2020-01-27 LAB — LIPID PANEL
Cholesterol: 240 mg/dL — ABNORMAL HIGH (ref 0–200)
HDL: 45.9 mg/dL (ref >39.00)
NonHDL: 193.89
Total CHOL/HDL Ratio: 5
Triglycerides: 219 mg/dL — ABNORMAL HIGH (ref 0.0–149.0)
VLDL: 43.8 mg/dL — ABNORMAL HIGH (ref 0.0–40.0)

## 2020-01-27 LAB — TESTOSTERONE: Testosterone: 472.46 ng/dL (ref 300.00–890.00)

## 2020-01-27 LAB — LDL CHOLESTEROL, DIRECT: Direct LDL: 167 mg/dL

## 2020-01-27 MED ORDER — ALPRAZOLAM 0.5 MG PO TABS
0.5000 mg | ORAL_TABLET | Freq: Two times a day (BID) | ORAL | 0 refills | Status: DC | PRN
Start: 2020-01-27 — End: 2022-06-19

## 2020-01-27 MED ORDER — PIOGLITAZONE HCL 30 MG PO TABS: 30.0000 mg | ORAL_TABLET | Freq: Every day | ORAL | 3 refills | Status: DC

## 2020-01-27 MED ORDER — GLYBURIDE MICRONIZED 1.5 MG PO TABS: 1.5000 mg | ORAL_TABLET | Freq: Every day | ORAL | 3 refills | Status: DC

## 2020-01-27 MED ORDER — LISINOPRIL 20 MG PO TABS: 20.0000 mg | ORAL_TABLET | Freq: Every day | ORAL | 3 refills | Status: DC

## 2020-01-27 NOTE — Progress Notes (Signed)
Subjective:    Patient ID: [name] [name], male    DOB: 1976-08-21, 44 y.o.   MRN: [id number]  HPI Here for a well exam. He has a few concerns. First the cyst on the back of his neck still gets inflamed from time to time. We have treated it periodically with Doxycycline. He also has some areas of hair loss in the beard. He saw Lennie Odor PA at [facility] for this about 7 years ago, and he wants to see her again. Also he wants to check his testosterone level. His libido has been low for several years, and his brother (age 73) was just started on testosterone shots. Otherwise he still sees [facility].    Review of Systems  Constitutional: Negative.   HENT: Negative.   Eyes: Negative.   Respiratory: Negative.   Cardiovascular: Negative.   Gastrointestinal: Negative.   Genitourinary: Negative.   Musculoskeletal: Negative.   Skin: Negative.   Neurological: Negative.   Psychiatric/Behavioral: Negative.        Objective:   Physical Exam Constitutional:      General: He is not in acute distress.    Appearance: He is well-developed. He is not diaphoretic.  HENT:     Head: Normocephalic and atraumatic.     Right Ear: External ear normal.     Left Ear: External ear normal.     Nose: Nose normal.     Mouth/Throat:     Pharynx: No oropharyngeal exudate.  Eyes:     General: No scleral icterus.       Right eye: No discharge.        Left eye: No discharge.     Conjunctiva/sclera: Conjunctivae normal.     Pupils: Pupils are equal, round, and reactive to light.  Neck:     Thyroid: No thyromegaly.     Vascular: No JVD.     Trachea: No tracheal deviation.  Cardiovascular:     Rate and Rhythm: Normal rate and regular rhythm.     Heart sounds: Normal heart sounds. No murmur. No friction rub. No gallop.   Pulmonary:     Effort: Pulmonary effort is normal. No respiratory distress.     Breath sounds: Normal breath sounds. No wheezing or rales.  Chest:     Chest  wall: No tenderness.  Abdominal:     General: Bowel sounds are normal. There is no distension.     Palpations: Abdomen is soft. There is no mass.     Tenderness: There is no abdominal tenderness. There is no guarding or rebound.  Genitourinary:    Penis: Normal. No tenderness.      Testes: Normal.  Musculoskeletal:        General: No tenderness. Normal range of motion.     Cervical back: Neck supple.  Lymphadenopathy:     Cervical: No cervical adenopathy.  Skin:    General: Skin is warm and dry.     Coloration: Skin is not pale.     Findings: No erythema or rash.     Comments: There are several sebaceous cysts on the posterior neck  Neurological:     Mental Status: He is alert and oriented to person, place, and time.     Cranial Nerves: No cranial nerve deficit.     Motor: No abnormal muscle tone.     Coordination: Coordination normal.     Deep Tendon Reflexes: Reflexes are normal and symmetric. Reflexes normal.  Psychiatric:  Behavior: Behavior normal.        Thought Content: Thought content normal.        Judgment: Judgment normal.           Assessment & Plan:  Well exam. We discussed diet and exercise. Get fasting labs including a testosterone level. Refer to Dermatology.  Gershon Crane, MD

## 2020-01-28 ENCOUNTER — Other Ambulatory Visit: Payer: Self-pay

## 2020-01-28 ENCOUNTER — Telehealth: Payer: Self-pay | Admitting: Family Medicine

## 2020-01-28 MED ORDER — METFORMIN HCL 1000 MG PO TABS
1000.0000 mg | ORAL_TABLET | Freq: Two times a day (BID) | ORAL | 3 refills | Status: DC
Start: 2020-01-28 — End: 2020-07-31

## 2020-01-28 NOTE — Telephone Encounter (Signed)
Pt is returning your call

## 2020-01-28 NOTE — Telephone Encounter (Signed)
Patient called for lab results. Discussed results with patient, patient expressed understanding. Nothing further needed.

## 2020-04-24 MED FILL — ?DULoxetine HCL 30MG CPEP: 30 | 22 days supply | Qty: 90 | Fill #0

## 2020-05-22 MED FILL — DULoxetine HCL 30 MG CPEP: 30 | 21 days supply | Qty: 86 | Fill #1

## 2020-07-11 ENCOUNTER — Ambulatory Visit (INDEPENDENT_AMBULATORY_CARE_PROVIDER_SITE_OTHER): Payer: No Payment, Other | Admitting: Behavioral Health

## 2020-07-11 ENCOUNTER — Other Ambulatory Visit: Payer: Self-pay

## 2020-07-11 DIAGNOSIS — F330 Major depressive disorder, recurrent, mild: Secondary | ICD-10-CM

## 2020-07-11 NOTE — Progress Notes (Deleted)
[facility] - longer than 5 years  Med: Cymbalta - 30mg  (2 tablets), Cymbalta - 30mg  (2 tablets) ... 6 or 7 years  Enough for less than a week  All this stuff started back when I was [age]yo  Primary: Dr.  No insurance  Alprazolam - 1/2mg  (primary care)  Anxiety: doesn't like crowds -since 44yo

## 2020-07-17 ENCOUNTER — Telehealth: Payer: Self-pay | Admitting: Family Medicine

## 2020-07-17 NOTE — Telephone Encounter (Signed)
Refill request for:  Duloxetine 30 mg LR 04/24/18, #90, 3 rfs LOV 6/321 FOV  None scheduled.   Please review and advise.  Thanks.  Dm/cma

## 2020-07-17 NOTE — Telephone Encounter (Signed)
Pt is calling in to get a refill on duloxetine (CYMBALTA) 30 MG #120  [facility] [address].

## 2020-07-18 MED ORDER — DULOXETINE HCL 30 MG PO CPEP: ORAL_CAPSULE | ORAL | 3 refills | Status: DC

## 2020-07-18 NOTE — Addendum Note (Signed)
Addended by: Waymond Cera on: 07/18/2020 08:36 AM   Modules accepted: Orders

## 2020-07-18 NOTE — Telephone Encounter (Signed)
l °

## 2020-07-18 NOTE — Telephone Encounter (Signed)
Rx sent to the pharmacy as directed by providers instructions.   Dm/cma

## 2020-07-18 NOTE — Telephone Encounter (Signed)
Please refill this for one year  

## 2020-07-24 ENCOUNTER — Telehealth: Payer: Self-pay | Admitting: Family Medicine

## 2020-07-24 NOTE — Telephone Encounter (Signed)
Pt is calling in wanting to see if the Rx duloxetine (CYMBALTA) 30 MG can be resent to the pharmacy due to the pharmacy saying that they have not received it.  [facility] [address].  Pt stated that he is out and would like to have a call once it has been called in to the pharmacy.

## 2020-07-25 MED ORDER — DULOXETINE HCL 30 MG PO CPEP: ORAL_CAPSULE | ORAL | 11 refills | Status: DC

## 2020-07-25 NOTE — Telephone Encounter (Signed)
Spoke with Katrina as the chart indicates the Rx was phoned in on 11/23.  Per Katrina they do not have the Rx on file and I spoke with Herbert Seta the pharmacist to phone in the refill for a year as per PCP.  Patient was informed also.

## 2020-07-30 ENCOUNTER — Ambulatory Visit (HOSPITAL_COMMUNITY)
Admission: EM | Admit: 2020-07-30 | Discharge: 2020-07-31 | Disposition: A | Discharge status: Home / Self Care | Payer: Self-pay | Attending: Surgery | Admitting: Surgery

## 2020-07-30 ENCOUNTER — Other Ambulatory Visit: Payer: Self-pay

## 2020-07-30 ENCOUNTER — Encounter (HOSPITAL_COMMUNITY): Payer: Self-pay | Admitting: Emergency Medicine

## 2020-07-30 DIAGNOSIS — Z7984 Long term (current) use of oral hypoglycemic drugs: Secondary | ICD-10-CM | POA: Insufficient documentation

## 2020-07-30 DIAGNOSIS — F17210 Nicotine dependence, cigarettes, uncomplicated: Secondary | ICD-10-CM | POA: Insufficient documentation

## 2020-07-30 DIAGNOSIS — E119 Type 2 diabetes mellitus without complications: Secondary | ICD-10-CM | POA: Insufficient documentation

## 2020-07-30 DIAGNOSIS — F319 Bipolar disorder, unspecified: Secondary | ICD-10-CM | POA: Insufficient documentation

## 2020-07-30 DIAGNOSIS — I10 Essential (primary) hypertension: Secondary | ICD-10-CM | POA: Insufficient documentation

## 2020-07-30 DIAGNOSIS — Z20822 Contact with and (suspected) exposure to covid-19: Secondary | ICD-10-CM | POA: Insufficient documentation

## 2020-07-30 DIAGNOSIS — Z79899 Other long term (current) drug therapy: Secondary | ICD-10-CM | POA: Insufficient documentation

## 2020-07-30 DIAGNOSIS — K3533 Acute appendicitis with perforation and localized peritonitis, with abscess: Secondary | ICD-10-CM | POA: Insufficient documentation

## 2020-07-30 DIAGNOSIS — K353 Acute appendicitis with localized peritonitis, without perforation or gangrene: Secondary | ICD-10-CM

## 2020-07-30 LAB — COMPREHENSIVE METABOLIC PANEL
ALT: 22 U/L (ref 0–44)
AST: 22 U/L (ref 15–41)
Albumin: 3.9 g/dL (ref 3.5–5.0)
Alkaline Phosphatase: 97 U/L (ref 38–126)
Anion gap: 15 (ref 5–15)
BUN: 15 mg/dL (ref 6–20)
CO2: 23 mmol/L (ref 22–32)
Calcium: 9.3 mg/dL (ref 8.9–10.3)
Chloride: 93 mmol/L — ABNORMAL LOW (ref 98–111)
Creatinine, Ser: 0.67 mg/dL (ref 0.61–1.24)
GFR, Estimated: >60 mL/min (ref >60)
Glucose, Bld: 265 mg/dL — ABNORMAL HIGH (ref 70–99)
Potassium: 3.5 mmol/L (ref 3.5–5.1)
Sodium: 131 mmol/L — ABNORMAL LOW (ref 135–145)
Total Bilirubin: 1.4 mg/dL — ABNORMAL HIGH (ref 0.3–1.2)
Total Protein: 7.4 g/dL (ref 6.5–8.1)

## 2020-07-30 LAB — CBC
HCT: 48.3 % (ref 39.0–52.0)
Hemoglobin: 16.6 g/dL (ref 13.0–17.0)
MCH: 34.4 pg — ABNORMAL HIGH (ref 26.0–34.0)
MCHC: 34.4 g/dL (ref 30.0–36.0)
MCV: 100.2 fL — ABNORMAL HIGH (ref 80.0–100.0)
Platelets: 145 10*3/uL — ABNORMAL LOW (ref 150–400)
RBC: 4.82 MIL/uL (ref 4.22–5.81)
RDW: 11.9 % (ref 11.5–15.5)
WBC: 14 10*3/uL — ABNORMAL HIGH (ref 4.0–10.5)
nRBC: 0 % (ref 0.0–0.2)

## 2020-07-30 LAB — URINALYSIS, ROUTINE W REFLEX MICROSCOPIC
Bacteria, UA: NONE SEEN
Bilirubin Urine: NEGATIVE
Glucose, UA: >=500 mg/dL — AB
Hgb urine dipstick: NEGATIVE
Ketones, ur: 80 mg/dL — AB
Leukocytes,Ua: NEGATIVE
Nitrite: NEGATIVE
Protein, ur: 100 mg/dL — AB
Specific Gravity, Urine: 1.03 (ref 1.005–1.030)
pH: 6 (ref 5.0–8.0)

## 2020-07-30 LAB — LIPASE, BLOOD: Lipase: 33 U/L (ref 11–51)

## 2020-07-30 NOTE — ED Triage Notes (Signed)
Pt c/o RLQ pain that started this morning. Denies nausea/vomiting/diarrhea. 

## 2020-07-31 ENCOUNTER — Emergency Department (HOSPITAL_COMMUNITY): Payer: Self-pay | Admitting: Anesthesiology

## 2020-07-31 ENCOUNTER — Encounter (HOSPITAL_COMMUNITY): Payer: Self-pay | Admitting: Student

## 2020-07-31 ENCOUNTER — Encounter (HOSPITAL_COMMUNITY)
Admission: EM | Disposition: A | Discharge status: Home / Self Care | Payer: Self-pay | Source: Home / Self Care | Attending: Emergency Medicine

## 2020-07-31 ENCOUNTER — Emergency Department (HOSPITAL_COMMUNITY): Payer: Self-pay

## 2020-07-31 HISTORY — PX: LAPAROSCOPIC APPENDECTOMY: SHX408

## 2020-07-31 LAB — GLUCOSE, CAPILLARY: Glucose-Capillary: 240 mg/dL — ABNORMAL HIGH (ref 70–99)

## 2020-07-31 LAB — RESP PANEL BY RT-PCR (FLU A&B, COVID) ARPGX2
Influenza A by PCR: NEGATIVE
Influenza B by PCR: NEGATIVE
SARS Coronavirus 2 by RT PCR: NEGATIVE

## 2020-07-31 LAB — CBG MONITORING, ED: Glucose-Capillary: 221 mg/dL — ABNORMAL HIGH (ref 70–99)

## 2020-07-31 SURGERY — APPENDECTOMY, LAPAROSCOPIC
Anesthesia: General | Site: Abdomen

## 2020-07-31 MED ORDER — ROCURONIUM BROMIDE 10 MG/ML (PF) SYRINGE
PREFILLED_SYRINGE | INTRAVENOUS | Status: DC | PRN
  Administered 2020-07-31: 50 mg via INTRAVENOUS

## 2020-07-31 MED ORDER — ONDANSETRON HCL 4 MG/2ML IJ SOLN: 4.0000 mg | Freq: Four times a day (QID) | INTRAMUSCULAR | Status: DC | PRN

## 2020-07-31 MED ORDER — DEXAMETHASONE SODIUM PHOSPHATE 10 MG/ML IJ SOLN
INTRAMUSCULAR | Status: DC | PRN
  Administered 2020-07-31: 10 mg via INTRAVENOUS

## 2020-07-31 MED ORDER — LACTATED RINGERS IV SOLN: INTRAVENOUS | Status: DC | PRN

## 2020-07-31 MED ORDER — METRONIDAZOLE IN NACL 5-0.79 MG/ML-% IV SOLN
500.0000 mg | Freq: Three times a day (TID) | INTRAVENOUS | Status: DC
  Administered 2020-07-31: 500 mg via INTRAVENOUS
  Filled 2020-07-31: qty 100

## 2020-07-31 MED ORDER — CHLORHEXIDINE GLUCONATE 0.12 % MT SOLN: 15.0000 mL | Freq: Once | OROMUCOSAL | Status: AC

## 2020-07-31 MED ORDER — PROPOFOL 10 MG/ML IV BOLUS
INTRAVENOUS | Status: DC | PRN
  Administered 2020-07-31: 200 mg via INTRAVENOUS

## 2020-07-31 MED ORDER — 0.9 % SODIUM CHLORIDE (POUR BTL) OPTIME
TOPICAL | Status: DC | PRN
  Administered 2020-07-31: 1000 mL

## 2020-07-31 MED ORDER — MORPHINE SULFATE (PF) 4 MG/ML IV SOLN
4.0000 mg | Freq: Once | INTRAVENOUS | Status: AC
  Administered 2020-07-31: 4 mg via INTRAVENOUS
  Filled 2020-07-31: qty 1

## 2020-07-31 MED ORDER — ROCURONIUM BROMIDE 10 MG/ML (PF) SYRINGE
PREFILLED_SYRINGE | INTRAVENOUS | Status: AC
  Filled 2020-07-31: qty 10

## 2020-07-31 MED ORDER — MORPHINE SULFATE (PF) 2 MG/ML IV SOLN
2.0000 mg | INTRAVENOUS | Status: DC | PRN
  Administered 2020-07-31: 2 mg via INTRAVENOUS
  Filled 2020-07-31: qty 1

## 2020-07-31 MED ORDER — ONDANSETRON HCL 4 MG/2ML IJ SOLN
4.0000 mg | Freq: Once | INTRAMUSCULAR | Status: AC
  Administered 2020-07-31: 4 mg via INTRAVENOUS
  Filled 2020-07-31: qty 2

## 2020-07-31 MED ORDER — ONDANSETRON HCL 4 MG/2ML IJ SOLN
INTRAMUSCULAR | Status: AC
  Filled 2020-07-31: qty 2

## 2020-07-31 MED ORDER — ACETAMINOPHEN 500 MG PO TABS
1000.0000 mg | ORAL_TABLET | ORAL | Status: AC
  Administered 2020-07-31: 1000 mg via ORAL
  Filled 2020-07-31: qty 2

## 2020-07-31 MED ORDER — SUGAMMADEX SODIUM 200 MG/2ML IV SOLN
INTRAVENOUS | Status: DC | PRN
  Administered 2020-07-31: 200 mg via INTRAVENOUS

## 2020-07-31 MED ORDER — DEXAMETHASONE SODIUM PHOSPHATE 10 MG/ML IJ SOLN
INTRAMUSCULAR | Status: AC
  Filled 2020-07-31: qty 1

## 2020-07-31 MED ORDER — PROMETHAZINE HCL 25 MG/ML IJ SOLN: 6.2500 mg | INTRAMUSCULAR | Status: DC | PRN

## 2020-07-31 MED ORDER — KETOROLAC TROMETHAMINE 30 MG/ML IJ SOLN
INTRAMUSCULAR | Status: DC | PRN
  Administered 2020-07-31: 30 mg via INTRAVENOUS

## 2020-07-31 MED ORDER — FENTANYL CITRATE (PF) 100 MCG/2ML IJ SOLN: 25.0000 ug | INTRAMUSCULAR | Status: DC | PRN

## 2020-07-31 MED ORDER — OXYCODONE HCL 5 MG PO TABS: 5.0000 mg | ORAL_TABLET | Freq: Once | ORAL | Status: DC | PRN

## 2020-07-31 MED ORDER — MIDAZOLAM HCL 5 MG/5ML IJ SOLN
INTRAMUSCULAR | Status: DC | PRN
  Administered 2020-07-31: 2 mg via INTRAVENOUS

## 2020-07-31 MED ORDER — ONDANSETRON HCL 4 MG/2ML IJ SOLN
INTRAMUSCULAR | Status: DC | PRN
  Administered 2020-07-31: 4 mg via INTRAVENOUS

## 2020-07-31 MED ORDER — MIDAZOLAM HCL 2 MG/2ML IJ SOLN
INTRAMUSCULAR | Status: AC
  Filled 2020-07-31: qty 2

## 2020-07-31 MED ORDER — EPHEDRINE SULFATE-NACL 50-0.9 MG/10ML-% IV SOSY
PREFILLED_SYRINGE | INTRAVENOUS | Status: DC | PRN
  Administered 2020-07-31: 5 mg via INTRAVENOUS
  Administered 2020-07-31: 10 mg via INTRAVENOUS

## 2020-07-31 MED ORDER — OXYCODONE HCL 5 MG/5ML PO SOLN: 5.0000 mg | Freq: Once | ORAL | Status: DC | PRN

## 2020-07-31 MED ORDER — PROPOFOL 10 MG/ML IV BOLUS
INTRAVENOUS | Status: AC
  Filled 2020-07-31: qty 20

## 2020-07-31 MED ORDER — IOHEXOL 300 MG/ML  SOLN
100.0000 mL | Freq: Once | INTRAMUSCULAR | Status: AC | PRN
  Administered 2020-07-31: 100 mL via INTRAVENOUS

## 2020-07-31 MED ORDER — INSULIN ASPART 100 UNIT/ML ~~LOC~~ SOLN: 3.0000 [IU] | Freq: Once | SUBCUTANEOUS | Status: DC

## 2020-07-31 MED ORDER — INSULIN ASPART 100 UNIT/ML ~~LOC~~ SOLN
SUBCUTANEOUS | Status: AC
  Filled 2020-07-31: qty 1

## 2020-07-31 MED ORDER — ONDANSETRON HCL 4 MG PO TABS: 4.0000 mg | ORAL_TABLET | Freq: Four times a day (QID) | ORAL | 1 refills | Status: DC | PRN

## 2020-07-31 MED ORDER — ACETAMINOPHEN 500 MG PO TABS
500.0000 mg | ORAL_TABLET | Freq: Four times a day (QID) | ORAL | 0 refills | Status: AC | PRN
Start: 2020-07-31 — End: ?

## 2020-07-31 MED ORDER — BUPIVACAINE-EPINEPHRINE 0.25% -1:200000 IJ SOLN
INTRAMUSCULAR | Status: DC | PRN
  Administered 2020-07-31: 10 mL

## 2020-07-31 MED ORDER — KETOROLAC TROMETHAMINE 30 MG/ML IJ SOLN: 30.0000 mg | Freq: Once | INTRAMUSCULAR | Status: DC | PRN

## 2020-07-31 MED ORDER — LIDOCAINE 2% (20 MG/ML) 5 ML SYRINGE
INTRAMUSCULAR | Status: DC | PRN
  Administered 2020-07-31: 60 mg via INTRAVENOUS

## 2020-07-31 MED ORDER — SODIUM CHLORIDE 0.9 % IV SOLN
2.0000 g | INTRAVENOUS | Status: DC
  Administered 2020-07-31: 2 g via INTRAVENOUS
  Filled 2020-07-31: qty 20

## 2020-07-31 MED ORDER — CHLORHEXIDINE GLUCONATE 0.12 % MT SOLN
OROMUCOSAL | Status: AC
  Administered 2020-07-31: 15 mL via OROMUCOSAL
  Filled 2020-07-31: qty 15

## 2020-07-31 MED ORDER — FENTANYL CITRATE (PF) 250 MCG/5ML IJ SOLN
INTRAMUSCULAR | Status: DC | PRN
  Administered 2020-07-31: 150 ug via INTRAVENOUS
  Administered 2020-07-31: 50 ug via INTRAVENOUS

## 2020-07-31 MED ORDER — PHENYLEPHRINE 40 MCG/ML (10ML) SYRINGE FOR IV PUSH (FOR BLOOD PRESSURE SUPPORT)
PREFILLED_SYRINGE | INTRAVENOUS | Status: DC | PRN
  Administered 2020-07-31: 80 ug via INTRAVENOUS
  Administered 2020-07-31: 120 ug via INTRAVENOUS

## 2020-07-31 MED ORDER — SODIUM CHLORIDE 0.9 % IV SOLN: INTRAVENOUS | Status: DC

## 2020-07-31 MED ORDER — SODIUM CHLORIDE 0.9 % IV BOLUS
1000.0000 mL | Freq: Once | INTRAVENOUS | Status: AC
  Administered 2020-07-31: 1000 mL via INTRAVENOUS

## 2020-07-31 MED ORDER — FENTANYL CITRATE (PF) 250 MCG/5ML IJ SOLN
INTRAMUSCULAR | Status: AC
  Filled 2020-07-31: qty 5

## 2020-07-31 MED ORDER — INSULIN ASPART 100 UNIT/ML ~~LOC~~ SOLN: 0.0000 [IU] | Freq: Three times a day (TID) | SUBCUTANEOUS | Status: DC

## 2020-07-31 MED ORDER — LIDOCAINE HCL (PF) 2 % IJ SOLN
INTRAMUSCULAR | Status: AC
  Filled 2020-07-31: qty 5

## 2020-07-31 MED ORDER — BUPIVACAINE-EPINEPHRINE (PF) 0.25% -1:200000 IJ SOLN
INTRAMUSCULAR | Status: AC
  Filled 2020-07-31: qty 10

## 2020-07-31 MED ORDER — GABAPENTIN 300 MG PO CAPS
300.0000 mg | ORAL_CAPSULE | ORAL | Status: AC
  Administered 2020-07-31: 300 mg via ORAL
  Filled 2020-07-31: qty 1

## 2020-07-31 MED ORDER — OXYCODONE HCL 5 MG PO TABS: 5.0000 mg | ORAL_TABLET | Freq: Four times a day (QID) | ORAL | 0 refills | Status: DC | PRN

## 2020-07-31 SURGICAL SUPPLY — 52 items
APL PRP STRL LF DISP 70% ISPRP (MISCELLANEOUS) ×1
APL SKNCLS STERI-STRIP NONHPOA (GAUZE/BANDAGES/DRESSINGS) ×1
APPLIER CLIP ROT 10 11.4 M/L (STAPLE)
APR CLP MED LRG 11.4X10 (STAPLE)
BAG SPEC RTRVL LRG 6X4 10 (ENDOMECHANICALS) ×1
BENZOIN TINCTURE PRP APPL 2/3 (GAUZE/BANDAGES/DRESSINGS) ×3 IMPLANT
BLADE CLIPPER SURG (BLADE) IMPLANT
CANISTER SUCT 3000ML PPV (MISCELLANEOUS) IMPLANT
CHLORAPREP W/TINT 26 (MISCELLANEOUS) ×3 IMPLANT
CLIP APPLIE ROT 10 11.4 M/L (STAPLE) IMPLANT
CLOSURE WOUND 1/2 X4 (GAUZE/BANDAGES/DRESSINGS) ×1
CONT SPEC 4OZ CLIKSEAL STRL BL (MISCELLANEOUS) ×2 IMPLANT
COVER SURGICAL LIGHT HANDLE (MISCELLANEOUS) ×3 IMPLANT
COVER WAND RF STERILE (DRAPES) ×1 IMPLANT
CUTTER FLEX LINEAR 45M (STAPLE) ×3 IMPLANT
DRSG TEGADERM 2-3/8X2-3/4 SM (GAUZE/BANDAGES/DRESSINGS) ×6 IMPLANT
DRSG TEGADERM 4X4.75 (GAUZE/BANDAGES/DRESSINGS) ×3 IMPLANT
ELECT REM PT RETURN 9FT ADLT (ELECTROSURGICAL) ×3
ELECTRODE REM PT RTRN 9FT ADLT (ELECTROSURGICAL) ×1 IMPLANT
ENDOLOOP SUT PDS II  0 18 (SUTURE)
ENDOLOOP SUT PDS II 0 18 (SUTURE) IMPLANT
GAUZE SPONGE 2X2 8PLY STRL LF (GAUZE/BANDAGES/DRESSINGS) ×1 IMPLANT
GLOVE BIO SURGEON STRL SZ7 (GLOVE) ×3 IMPLANT
GLOVE BIOGEL PI IND STRL 7.5 (GLOVE) ×1 IMPLANT
GLOVE BIOGEL PI INDICATOR 7.5 (GLOVE) ×2
GOWN STRL REUS W/ TWL LRG LVL3 (GOWN DISPOSABLE) ×3 IMPLANT
GOWN STRL REUS W/TWL LRG LVL3 (GOWN DISPOSABLE) ×9
KIT BASIN OR (CUSTOM PROCEDURE TRAY) ×3 IMPLANT
KIT TURNOVER KIT B (KITS) ×3 IMPLANT
NDL 18GX1X1/2 (RX/OR ONLY) (NEEDLE) IMPLANT
NEEDLE 18GX1X1/2 (RX/OR ONLY) (NEEDLE) ×3 IMPLANT
NS IRRIG 1000ML POUR BTL (IV SOLUTION) ×3 IMPLANT
PAD ARMBOARD 7.5X6 YLW CONV (MISCELLANEOUS) ×6 IMPLANT
POUCH SPECIMEN RETRIEVAL 10MM (ENDOMECHANICALS) ×3 IMPLANT
RELOAD STAPLE 45 3.5 BLU ETS (ENDOMECHANICALS) ×1 IMPLANT
RELOAD STAPLE TA45 3.5 REG BLU (ENDOMECHANICALS) ×3 IMPLANT
SCISSORS LAP 5X35 DISP (ENDOMECHANICALS) IMPLANT
SET IRRIG TUBING LAPAROSCOPIC (IRRIGATION / IRRIGATOR) IMPLANT
SET TUBE SMOKE EVAC HIGH FLOW (TUBING) ×3 IMPLANT
SHEARS HARMONIC ACE PLUS 36CM (ENDOMECHANICALS) ×3 IMPLANT
SLEEVE ENDOPATH XCEL 5M (ENDOMECHANICALS) ×3 IMPLANT
SPECIMEN JAR SMALL (MISCELLANEOUS) ×1 IMPLANT
SPONGE GAUZE 2X2 STER 10/PKG (GAUZE/BANDAGES/DRESSINGS) ×2
STRIP CLOSURE SKIN 1/2X4 (GAUZE/BANDAGES/DRESSINGS) ×2 IMPLANT
SUT MNCRL AB 4-0 PS2 18 (SUTURE) ×3 IMPLANT
SYR CONTROL 10ML LL (SYRINGE) ×4 IMPLANT
TOWEL GREEN STERILE (TOWEL DISPOSABLE) ×3 IMPLANT
TOWEL GREEN STERILE FF (TOWEL DISPOSABLE) ×3 IMPLANT
TRAY LAPAROSCOPIC MC (CUSTOM PROCEDURE TRAY) ×3 IMPLANT
TROCAR XCEL BLUNT TIP 100MML (ENDOMECHANICALS) ×3 IMPLANT
TROCAR XCEL NON-BLD 5MMX100MML (ENDOMECHANICALS) ×3 IMPLANT
WATER STERILE IRR 1000ML POUR (IV SOLUTION) ×3 IMPLANT

## 2020-07-31 NOTE — ED Provider Notes (Signed)
[name] Provider Note   CSN: [id number] Arrival date & time: 07/30/20  1856     History Chief Complaint  Patient presents with  . Abdominal Pain    [name] [name] is a 44 y.o. male with a hx of T2DM, hypertension, bipolar disorder, alcohol abuse, & anxiety who presents to the ED with complaints of abdominal pain that began at 11 AM yesterday.  Patient states pain had gradual onset with steady progression, currently is constant and located in the right lower quadrant of the abdomen.  Pain is nonradiating.  Current discomfort is a 6 out of 10 in severity, this is worse with movement and occasional coughing to clear his throat.  No alleviating factors.  He initially thought he might have been constipated therefore he took a laxative, he had a regular bowel movement without change in his symptoms.  He has had some nausea without vomiting.  He denies fever, decreased appetite, diarrhea, melena, dysuria, hematuria, frequency, or testicular pain/swelling.  Denies prior abdominal surgeries.  HPI     Past Medical History:  Diagnosis Date  . Alcohol abuse   . Bipolar disorder [facility])    sees [facility]   . Diabetes mellitus without complication (HCC)   . Headache(784.0)   . Hypertension   . Obesity 10/24/2012    Patient Active Problem List   Diagnosis Date Noted  . Bipolar disorder (HCC) 05/14/2019  . Type 2 diabetes mellitus without complications (HCC) 10/25/2016  . Unspecified constipation 01/13/2014  . Anxiety state 10/26/2012  . ALCOHOL ABUSE 11/20/2009  . Essential hypertension 11/20/2009  . ACNE, CYSTIC 11/20/2009    History reviewed. No pertinent surgical history.     Family History  Problem Relation Age of Onset  . Hypertension Other   . Cancer Other        lung  . Depression Other     Social History   Tobacco Use  . Smoking status: Current Every Day Smoker    Packs/day: 1.00    Types: Cigarettes  .  Smokeless tobacco: Never Used  Substance Use Topics  . Alcohol use: Yes    Alcohol/week: 0.0 standard drinks    Comment: occ  . Drug use: No    Home Medications Prior to Admission medications   Medication Sig Start Date End Date Taking? Authorizing Provider  ALPRAZolam Prudy Feeler) 0.5 MG tablet Take 1 tablet (0.5 mg total) by mouth 2 (two) times daily as needed for anxiety. 01/27/20   Nelwyn Salisbury, MD  Aspirin Effervescent (ALKA-SELTZER PO) Take by mouth.    [provider]  DULoxetine (CYMBALTA) 30 MG capsule Take 2 capsules by mouth in the morning (60 MG) and take 1 capsule (30 MG) in the evenings. 07/25/20   Nelwyn Salisbury, MD  fish oil-omega-3 fatty acids 1000 MG capsule Take 2 g by mouth daily.    [provider]  glyBURIDE micronized (GLYNASE) 1.5 MG tablet Take 1 tablet (1.5 mg total) by mouth daily. 01/27/20   Nelwyn Salisbury, MD  lisinopril (ZESTRIL) 20 MG tablet Take 1 tablet (20 mg total) by mouth daily. 01/27/20   Nelwyn Salisbury, MD  metFORMIN (GLUCOPHAGE) 1000 MG tablet Take 1 tablet (1,000 mg total) by mouth 2 (two) times daily with a meal. 01/28/20   Nelwyn Salisbury, MD  pioglitazone (ACTOS) 30 MG tablet Take 1 tablet (30 mg total) by mouth daily. 01/27/20   Nelwyn Salisbury, MD    Allergies  Patient has no known allergies.  Review of Systems   Review of Systems  Constitutional: Negative for appetite change and fever.  Respiratory: Negative for shortness of breath.   Cardiovascular: Negative for chest pain.  Gastrointestinal: Positive for abdominal pain and nausea. Negative for anal bleeding, blood in stool, diarrhea and vomiting.  Genitourinary: Negative for dysuria, frequency, hematuria, scrotal swelling and testicular pain.  Neurological: Negative for syncope.  All other systems reviewed and are negative.   Physical Exam Updated Vital Signs BP (!) 145/83 (BP Location: Left Arm)   Pulse (!) 107   Temp 98.9 F (37.2 C) (Oral)   Resp 16   SpO2 100%    Physical Exam Vitals and nursing note reviewed.  Constitutional:      General: He is not in acute distress.    Appearance: He is well-developed. He is not toxic-appearing.  HENT:     Head: Normocephalic and atraumatic.  Eyes:     General:        Right eye: No discharge.        Left eye: No discharge.     Conjunctiva/sclera: Conjunctivae normal.  Cardiovascular:     Rate and Rhythm: Regular rhythm. Tachycardia present.  Pulmonary:     Effort: Pulmonary effort is normal. No respiratory distress.     Breath sounds: Normal breath sounds. No wheezing, rhonchi or rales.  Abdominal:     General: There is no distension.     Palpations: Abdomen is soft.     Tenderness: There is abdominal tenderness in the right lower quadrant. There is no guarding. Positive signs include McBurney's sign. Negative signs include psoas sign and obturator sign.  Musculoskeletal:     Cervical back: Neck supple.  Skin:    General: Skin is warm and dry.     Findings: No rash.  Neurological:     Mental Status: He is alert.     Comments: Clear speech.   Psychiatric:        Behavior: Behavior normal.    ED Results / Procedures / Treatments   Labs (all labs ordered are listed, but only abnormal results are displayed) Labs Reviewed  COMPREHENSIVE METABOLIC PANEL - Abnormal; Notable for the following components:      Result Value   Sodium 131 (*)    Chloride 93 (*)    Glucose, Bld 265 (*)    Total Bilirubin 1.4 (*)    All other components within normal limits  CBC - Abnormal; Notable for the following components:   WBC 14.0 (*)    MCV 100.2 (*)    MCH 34.4 (*)    Platelets 145 (*)    All other components within normal limits  URINALYSIS, ROUTINE W REFLEX MICROSCOPIC - Abnormal; Notable for the following components:   Glucose, UA >=500 (*)    Ketones, ur 80 (*)    Protein, ur 100 (*)    All other components within normal limits  LIPASE, BLOOD    EKG None  Radiology No results found.   Procedures Procedures (including critical care time)  Medications Ordered in ED Medications  sodium chloride 0.9 % bolus 1,000 mL (0 mLs Intravenous Stopped 07/31/20 0622)  ondansetron (ZOFRAN) injection 4 mg (4 mg Intravenous Given 07/31/20 0444)  morphine 4 MG/ML injection 4 mg (4 mg Intravenous Given 07/31/20 0444)  iohexol (OMNIPAQUE) 300 MG/ML solution 100 mL (100 mLs Intravenous Contrast Given 07/31/20 0618)    ED Course  I have reviewed the triage vital signs and  the nursing notes.  Pertinent labs & imaging results that were available during my care of the patient were reviewed by me and considered in my medical decision making (see chart for details).    MDM Rules/Calculators/A&P                         Patient presents to the ED with complaints of abdominal pain.  He is nontoxic, resting comfortably, vitals with mild tachycardia and elevated BP.  On exam patient has right lower quadrant tenderness to palpation including tenderness over McBurney's point.  Primary concern is for appendicitis, additional differential diagnosis includes perforation, obstruction, cholecystitis, cholelithiasis, choledocholithiasis, nephrolithiasis, pyelonephritis, viral GI illness.  Additional history obtained:  Additional history obtained from chart review nursing note reviewed.. Lab Tests:  I reviewed and interpreted labs, which included:  CBC, CMP, lipase, and urinalysis: Leukocytosis at 14.0, mild thrombocytopenia similar to prior, hyperglycemia without acidosis or anion gap elevation, ketonuria, proteinuria, and glucose urea pleasant without UTI.  I have ordered morphine for pain, Zofran for nausea, and fluids for hydration.  Plan for CT abdomen/pelvis with contrast for further assessment.  06:35: Patient care signed out to [facility] PA-C at change of shift pending CT A/P & disposition. I  Portions of this note were generated with Scientist, clinical (histocompatibility and immunogenetics). Dictation errors may occur despite  best attempts at proofreading.  Final Clinical Impression(s) / ED Diagnoses Final diagnoses:  None    Rx / DC Orders ED Discharge Orders    None       Cherly Anderson, PA-C 07/31/20 [id number]    Marily Memos, MD 07/31/20 [phone]

## 2020-07-31 NOTE — H&P (Signed)
[name] [name] 03/13/1976  [id number].    Requesting MD: Dr. Clayborne Dana; [name] Heck, PA-C Chief Complaint/Reason for Consult: Acute Appendicitis  HPI: [name] [name] is a 44 y.o. male with hypertension, non-insulin-dependent diabetes and bipolar disorder who presented to [facility] ED for abdominal pain.  Patient reports around 11 AM yesterday he had gradual onset of periumbilical abdominal pain.  His pain gradually worsened and migrated to the right lower quadrant.  His pain is currently constant and rates as a 8/10.  He tried Gas-X and a laxative for this without any relief.  Denies similar symptoms in the past.  Notes associated nausea without emesis.  No fever, chills, upper abdominal pain, or diarrhea.  Work-up in the ED revealed acute suppurative appendicitis without rupture or abscess.  WBC 14.0.  General surgery was asked to see.  He is not on blood thinners. No prior abdominal surgeries.  He has been n.p.o. since 10 PM last night.   ROS: Review of Systems  Constitutional: Negative for chills and fever.  Respiratory: Negative for cough.   Cardiovascular: Negative for chest pain and leg swelling.  Gastrointestinal: Positive for abdominal pain and nausea. Negative for blood in stool, constipation, diarrhea and vomiting.  Genitourinary: Negative for dysuria and frequency.  Musculoskeletal: Negative for back pain.  Psychiatric/Behavioral: Negative for substance abuse.  All other systems reviewed and are negative.     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 Hypertension Other   . Cancer Other        lung  . Depression Other     Past Medical History:  Diagnosis Date  . Alcohol abuse   . Bipolar disorder [facility])    sees [facility]   . Diabetes mellitus without complication (HCC)   .  Headache(784.0)   . Hypertension   . Obesity 10/24/2012    History reviewed. No pertinent surgical history.  Social History:  reports that he has been smoking cigarettes. He has been smoking about 1.00 pack per day. He has never used smokeless tobacco. He reports current alcohol use. He reports that he does not use drugs.  Allergies: No Known Allergies  (Not in a hospital admission)   Physical Exam: Blood pressure 129/79, pulse 94, temperature 98.9 F (37.2 C), temperature source Oral, resp. rate 16, height 5\' 9"  (1.753 m), weight 106.6 kg, SpO2 98 %. General: pleasant, WD/WN white male who is laying in bed in NAD HEENT: head is normocephalic, atraumatic.  Sclera are noninjected.  PERRL.  Ears and nose without any masses or lesions.  Mouth is pink and moist. Dentition fair Heart: regular, rate, and rhythm.  Normal s1,s2. No obvious murmurs, gallops, or rubs noted.  Palpable pedal pulses bilaterally  Lungs: CTAB, no wheezes, rhonchi, or rales noted.  Respiratory effort nonlabored Abd: Soft, ND, RLQ abdominal tenderness. No guarding, rebound or rigidity. No RUQ tenderness. +BS, no masses, hernias, or organomegaly. No prior abdominal scars.  MS: no BUE/BLE edema, calves soft and nontender Skin: warm and dry with no masses, lesions, or rashes Psych: A&Ox4 with an appropriate affect Neuro: cranial nerves grossly intact, equal strength in BUE/BLE bilaterally, normal speech, thought process intact  Results for orders placed or performed during the hospital encounter of 07/30/20 (from the past 48 hour(s))  Lipase,  blood     Status: None   Collection Time: 07/30/20  7:54 PM  Result Value Ref Range   Lipase 33 11 - 51 U/L    Comment: Performed at [facility], [address], [place], [place] [address]  Comprehensive metabolic panel     Status: Abnormal   Collection Time: 07/30/20  7:54 PM  Result Value Ref Range   Sodium 131 (L) 135 - 145 mmol/L   Potassium 3.5 3.5 - 5.1 mmol/L    Chloride 93 (L) 98 - 111 mmol/L   CO2 23 22 - 32 mmol/L   Glucose, Bld 265 (H) 70 - 99 mg/dL    Comment: Glucose reference range applies only to samples taken after fasting for at least 8 hours.   BUN 15 6 - 20 mg/dL   Creatinine, Ser 9.62 0.61 - 1.24 mg/dL   Calcium 9.3 8.9 - 22.9 mg/dL   Total Protein 7.4 6.5 - 8.1 g/dL   Albumin 3.9 3.5 - 5.0 g/dL   AST 22 15 - 41 U/L   ALT 22 0 - 44 U/L   Alkaline Phosphatase 97 38 - 126 U/L   Total Bilirubin 1.4 (H) 0.3 - 1.2 mg/dL   GFR, Estimated >79 >89 mL/min    Comment: (NOTE) Calculated using the CKD-EPI Creatinine Equation (2021)    Anion gap 15 5 - 15    Comment: Performed at [facility], [address], [place], [place] [address]  CBC     Status: Abnormal   Collection Time: 07/30/20  7:54 PM  Result Value Ref Range   WBC 14.0 (H) 4.0 - 10.5 K/uL   RBC 4.82 4.22 - 5.81 MIL/uL   Hemoglobin 16.6 13.0 - 17.0 g/dL   HCT 17.4 39 - 52 %   MCV 100.2 (H) 80.0 - 100.0 fL   MCH 34.4 (H) 26.0 - 34.0 pg   MCHC 34.4 30.0 - 36.0 g/dL   RDW 08.1 44.8 - 18.5 %   Platelets 145 (L) 150 - 400 K/uL   nRBC 0.0 0.0 - 0.2 %    Comment: Performed at [facility], [address], [place], [place] [address]  Urinalysis, Routine w reflex microscopic     Status: Abnormal   Collection Time: 07/30/20  8:12 PM  Result Value Ref Range   Color, Urine YELLOW YELLOW   APPearance CLEAR CLEAR   Specific Gravity, Urine 1.030 1.005 - 1.030   pH 6.0 5.0 - 8.0   Glucose, UA >=500 (A) NEGATIVE mg/dL   Hgb urine dipstick NEGATIVE NEGATIVE   Bilirubin Urine NEGATIVE NEGATIVE   Ketones, ur 80 (A) NEGATIVE mg/dL   Protein, ur 702 (A) NEGATIVE mg/dL   Nitrite NEGATIVE NEGATIVE   Leukocytes,Ua NEGATIVE NEGATIVE   RBC / HPF 11-20 0 - 5 RBC/hpf   WBC, UA 0-5 0 - 5 WBC/hpf   Bacteria, UA NONE SEEN NONE SEEN   Hyaline Casts, UA PRESENT     Comment: Performed at [facility], [address] [place], [place] [address]   CT Abdomen Pelvis W  Contrast  Result Date: 07/31/2020 CLINICAL DATA:  Right lower quadrant pain EXAM: CT ABDOMEN AND PELVIS WITH CONTRAST TECHNIQUE: Multidetector CT imaging of the abdomen and pelvis was performed using the standard protocol following bolus administration of intravenous contrast. CONTRAST:  OMNIPAQUE IOHEXOL 300 MG/ML  SOLN COMPARISON:  None. FINDINGS: Lower chest:  No contributory findings. Hepatobiliary: Hepatic steatosis.No evidence of biliary obstruction or  stone. Pancreas: Unremarkable. Spleen: Unremarkable. Adrenals/Urinary Tract: Negative adrenals. No hydronephrosis or stone. Unremarkable bladder. Stomach/Bowel: No obstruction. Thick walled appendix with mesoappendiceal stranding. The outer wall diameter is 13 mm. No abscess or visible perforation. No reactive ileus Vascular/Lymphatic: Atheromatous calcification of the aorta and iliacs, notable for age. No mass or adenopathy. Reproductive:No pathologic findings. Other: No ascites or pneumoperitoneum. Musculoskeletal: No acute abnormalities. L4-5 and L5-S1 disc degeneration with notable L4-5 protrusion and buttressing osteophytes narrowing the spinal canal. IMPRESSION: 1. Acute suppurative appendicitis. 2. Hepatic steatosis. 3. Mild atherosclerosis. Electronically Signed   By: Marnee Spring M.D.   On: 07/31/2020 06:40   Anti-infectives (From admission, onward)   Start     Dose/Rate Route Frequency Ordered Stop   07/31/20 0800  cefTRIAXone (ROCEPHIN) 2 g in sodium chloride 0.9 % 100 mL IVPB        2 g 200 mL/hr over 30 Minutes Intravenous Every 24 hours 07/31/20 0753     07/31/20 0800  metroNIDAZOLE (FLAGYL) IVPB 500 mg        500 mg 100 mL/hr over 60 Minutes Intravenous Every 8 hours 07/31/20 0753        Assessment/Plan HTN DM2 Tobacco abuse Alcohol abuse Bipolar Disorder  Acute Appendicitis - Patients history, exam and imaging consistent with acute appendicitis. No evidence of rupture or abscess on imaging. Admit to outpatient  observation. IV abx ordered. Will plan for Laparoscopic Appendectomy today. I have discussed the procedure and risks of appendectomy. The risks include but are not limited to anesthesia (MI, CVA, aspiration, death), bleeding, infection, wound problems, injury to intra-abdominal organs, possibility of postoperative ileus and risk of DVT/PE. He seems to understand and agrees with the plan. We also discussed post-operative expectations and instructions. ERAS protocol.   FEN - NPO, IVF VTE - SCDs ID - Rocephin/Flagyl Foley - None  Jacinto Halim, [facility] Surgery 07/31/2020, 7:53 AM Please see Amion for pager number during day hours 7:00am-4:30pm

## 2020-07-31 NOTE — ED Provider Notes (Signed)
Accepted handoff at shift change from [facility]. Please see prior provider note for more detail.   Briefly: Patient is 44 y.o.   DDX: concern for appendicitis  Plan: FU on CT read   Physical Exam  BP 129/79   Pulse 94   Temp 98.9 F (37.2 C) (Oral)   Resp 16   Ht 5\' 9"  (1.753 m)   Wt 106.6 kg   SpO2 98%   BMI 34.70 kg/m   CONSTITUTIONAL:  well-appearing, NAD NEURO:  Alert and oriented x 3, no focal deficits EYES:  pupils equal and reactive ENT/NECK:  trachea midline, no JVD CARDIO:  reg rate, reg rhythm, well-perfused PULM:  None labored breathing GI/GU:  Abdomen non-distended, TTP in RLQ MSK/SPINE:  No gross deformities, no edema SKIN:  no rash obvious, atraumatic, no ecchymosis  PSYCH:  Appropriate speech and behavior  ED Course/Procedures     Procedures Results for orders placed or performed during the hospital encounter of 07/30/20  Lipase, blood  Result Value Ref Range   Lipase 33 11 - 51 U/L  Comprehensive metabolic panel  Result Value Ref Range   Sodium 131 (L) 135 - 145 mmol/L   Potassium 3.5 3.5 - 5.1 mmol/L   Chloride 93 (L) 98 - 111 mmol/L   CO2 23 22 - 32 mmol/L   Glucose, Bld 265 (H) 70 - 99 mg/dL   BUN 15 6 - 20 mg/dL   Creatinine, Ser 14/05/21 0.61 - 1.24 mg/dL   Calcium 9.3 8.9 - 0.94 mg/dL   Total Protein 7.4 6.5 - 8.1 g/dL   Albumin 3.9 3.5 - 5.0 g/dL   AST 22 15 - 41 U/L   ALT 22 0 - 44 U/L   Alkaline Phosphatase 97 38 - 126 U/L   Total Bilirubin 1.4 (H) 0.3 - 1.2 mg/dL   GFR, Estimated 70.9 >62 mL/min   Anion gap 15 5 - 15  CBC  Result Value Ref Range   WBC 14.0 (H) 4.0 - 10.5 K/uL   RBC 4.82 4.22 - 5.81 MIL/uL   Hemoglobin 16.6 13.0 - 17.0 g/dL   HCT >83 39 - 52 %   MCV 100.2 (H) 80.0 - 100.0 fL   MCH 34.4 (H) 26.0 - 34.0 pg   MCHC 34.4 30.0 - 36.0 g/dL   RDW 66.2 94.7 - 65.4 %   Platelets 145 (L) 150 - 400 K/uL   nRBC 0.0 0.0 - 0.2 %  Urinalysis, Routine w reflex microscopic  Result Value Ref Range   Color, Urine YELLOW  YELLOW   APPearance CLEAR CLEAR   Specific Gravity, Urine 1.030 1.005 - 1.030   pH 6.0 5.0 - 8.0   Glucose, UA >=500 (A) NEGATIVE mg/dL   Hgb urine dipstick NEGATIVE NEGATIVE   Bilirubin Urine NEGATIVE NEGATIVE   Ketones, ur 80 (A) NEGATIVE mg/dL   Protein, ur 65.0 (A) NEGATIVE mg/dL   Nitrite NEGATIVE NEGATIVE   Leukocytes,Ua NEGATIVE NEGATIVE   RBC / HPF 11-20 0 - 5 RBC/hpf   WBC, UA 0-5 0 - 5 WBC/hpf   Bacteria, UA NONE SEEN NONE SEEN   Hyaline Casts, UA PRESENT    CT Abdomen Pelvis W Contrast  Result Date: 07/31/2020 CLINICAL DATA:  Right lower quadrant pain EXAM: CT ABDOMEN AND PELVIS WITH CONTRAST TECHNIQUE: Multidetector CT imaging of the abdomen and pelvis was performed using the standard protocol following bolus administration of intravenous contrast. CONTRAST:  14/01/2020 OMNIPAQUE IOHEXOL 300 MG/ML  SOLN COMPARISON:  None. FINDINGS: Lower  chest:  No contributory findings. Hepatobiliary: Hepatic steatosis.No evidence of biliary obstruction or stone. Pancreas: Unremarkable. Spleen: Unremarkable. Adrenals/Urinary Tract: Negative adrenals. No hydronephrosis or stone. Unremarkable bladder. Stomach/Bowel: No obstruction. Thick walled appendix with mesoappendiceal stranding. The outer wall diameter is 13 mm. No abscess or visible perforation. No reactive ileus Vascular/Lymphatic: Atheromatous calcification of the aorta and iliacs, notable for age. No mass or adenopathy. Reproductive:No pathologic findings. Other: No ascites or pneumoperitoneum. Musculoskeletal: No acute abnormalities. L4-5 and L5-S1 disc degeneration with notable L4-5 protrusion and buttressing osteophytes narrowing the spinal canal. IMPRESSION: 1. Acute suppurative appendicitis. 2. Hepatic steatosis. 3. Mild atherosclerosis. Electronically Signed   By: Marnee Spring M.D.   On: 07/31/2020 06:40    MDM   CT shows acute suppurative appendicitis.    7:20 AM  -- Discussed with Elmer Sow PA-C of general surgery team. Surgery  will assess patient at bedside and place orders for antibiotics. I discussed CT read with patient he is understanding of need for surgery consultation.  Covid test pending at this time.    Solon Augusta Vivian, [place] 07/31/20 Darliss Ridgel    Linwood Dibbles, MD 08/01/20 [phone]

## 2020-07-31 NOTE — Progress Notes (Signed)
Patient states anytime his blood sugar gets below 180 he gets symptoms of hypoglycemia. Patient states his PCP is managing diabetes. Recommended he consider seeing an endocrinologist. Information given about [facility].

## 2020-07-31 NOTE — Op Note (Signed)
Appendectomy, Lap, Procedure Note  Indications: 44 y.o. male with hypertension, non-insulin-dependent diabetes and bipolar disorder who presented to [facility] ED for abdominal pain.  Patient reports around 11 AM yesterday he had gradual onset of periumbilical abdominal pain.  His pain gradually worsened and migrated to the right lower quadrant.  His pain is currently constant and rates as a 8/10.  He tried Gas-X and a laxative for this without any relief.  Denies similar symptoms in the past.  Notes associated nausea without emesis.  No fever, chills, upper abdominal pain, or diarrhea.  Work-up in the ED revealed acute suppurative appendicitis without rupture or abscess.  WBC 14.0.   Pre-operative Diagnosis: Acute appendicitis   Post-operative Diagnosis: Same  Surgeon: Manus Rudd, MD  Assistants: Lannette Donath, MD  Anesthesia: General endotracheal anesthesia  ASA Class: 2  Procedure Details  The patient was seen again in the Holding Room. The risks, benefits, complications, treatment options, and expected outcomes were discussed with the patient and/or family. The possibilities of reaction to medication, perforation of viscus, bleeding, recurrent infection, finding a normal appendix, the need for additional procedures, failure to diagnose a condition, and creating a complication requiring transfusion or operation were discussed. There was concurrence with the proposed plan and informed consent was obtained. The site of surgery was properly noted. The patient was taken to Operating Room, identified as [name] [name] and the procedure verified as Appendectomy. A Time Out was held and the above information confirmed.  The patient was placed in the supine position and general anesthesia was induced.  The abdomen was prepped and draped in a sterile fashion. An infraumbilical incision was made.  Dissection was carried down to the fascia bluntly.  The fascia was incised vertically.  We entered the  peritoneal cavity bluntly.  A pursestring suture was passed around the incision with a 0 Vicryl.  The Hasson cannula was introduced into the abdomen and the tails of the suture were used to hold the Hasson in place.   The pneumoperitoneum was then established maintaining a maximum pressure of 15 mmHg.  Additional 5 mm cannulas then placed in the left lower quadrant of the abdomen and the right upper quadrant under direct visualization. The patient was placed in Trendelenburg and left lateral decubitus position.  The scope was moved to the right upper quadrant port site.The appendix was easily identified on the inferior medial aspect of the cecum and was observed to be dilated and inflamed. The appendix was carefully dissected. The appendix was skeletonized with the harmonic scalpel. The appendix was divided at its base using an endo-GIA stapler. Minimal appendiceal stump was left in place. There was no evidence of bleeding, leakage, or complication after division of the appendix. The appendix was placed in an EndoCatch bag and removed from the umbilical port site.   The umbilical port site was closed with the purse string suture. There was no residual palpable fascial defect. The abdomen was desufflated and the trocars were removed. The trocar site skin wounds were closed with 4-0 Monocryl. Gauze dressings were placed.   Instrument, sponge, and needle counts were correct at the conclusion of the case.   Findings: The appendix was found to be inflamed. There were not signs of necrosis.  There was not perforation. There was not abscess formation.  Estimated Blood Loss:  Minimal         Drains: None         Specimens: Appendix  Complications:  None; patient tolerated the procedure well.         Disposition: PACU - hemodynamically stable.         Condition: stable

## 2020-07-31 NOTE — Discharge Summary (Signed)
[facility] Discharge Summary   Patient ID: [name] [name] MRN: [id number] DOB/AGE: 43-Jun-1977 43 y.o.  Admit date: 07/30/2020 Discharge date: 07/31/2020  Admitting Diagnosis: Acute appendicitis  Discharge Diagnosis Patient Active Problem List   Diagnosis Date Noted  . Bipolar disorder (HCC) 05/14/2019  . Type 2 diabetes mellitus without complications (HCC) 10/25/2016  . Unspecified constipation 01/13/2014  . Anxiety state 10/26/2012  . ALCOHOL ABUSE 11/20/2009  . Essential hypertension 11/20/2009  . ACNE, CYSTIC 11/20/2009    Consultants None  Imaging: CT Abdomen Pelvis W Contrast  Result Date: 07/31/2020 CLINICAL DATA:  Right lower quadrant pain EXAM: CT ABDOMEN AND PELVIS WITH CONTRAST TECHNIQUE: Multidetector CT imaging of the abdomen and pelvis was performed using the standard protocol following bolus administration of intravenous contrast. CONTRAST:  OMNIPAQUE IOHEXOL 300 MG/ML  SOLN COMPARISON:  None. FINDINGS: Lower chest:  No contributory findings. Hepatobiliary: Hepatic steatosis.No evidence of biliary obstruction or stone. Pancreas: Unremarkable. Spleen: Unremarkable. Adrenals/Urinary Tract: Negative adrenals. No hydronephrosis or stone. Unremarkable bladder. Stomach/Bowel: No obstruction. Thick walled appendix with mesoappendiceal stranding. The outer wall diameter is 13 mm. No abscess or visible perforation. No reactive ileus Vascular/Lymphatic: Atheromatous calcification of the aorta and iliacs, notable for age. No mass or adenopathy. Reproductive:No pathologic findings. Other: No ascites or pneumoperitoneum. Musculoskeletal: No acute abnormalities. L4-5 and L5-S1 disc degeneration with notable L4-5 protrusion and buttressing osteophytes narrowing the spinal canal. IMPRESSION: 1. Acute suppurative appendicitis. 2. Hepatic steatosis. 3. Mild atherosclerosis. Electronically Signed   By: Marnee Spring M.D.   On: 07/31/2020 06:40    Procedures Dr.  Corliss Skains (07/31/2020) - Laparoscopic Appendectomy  Hospital Course:  [name] [name] is a 44yo male PMH HTN, DM, and bipolar disorder who presented to [facility] 12/5 with gradual onset periumbilical abdominal pain. His pain gradually worsened and migrated to the right lower quadrant.  Workup showed acute appendicitis.  Patient was admitted and underwent procedure listed above.  Tolerated procedure well.  On POD#0 the patient was voiding well, tolerating diet, ambulating well, pain well controlled, vital signs stable and felt stable for discharge home.  Patient will follow up as below and knows to call with questions or concerns.    I have personally reviewed the patients medication history on the [place] controlled substance database.    Allergies as of 07/31/2020   No Known Allergies     Medication List    STOP taking these medications   metFORMIN 1000 MG tablet Commonly known as: GLUCOPHAGE   pioglitazone 30 MG tablet Commonly known as: ACTOS     TAKE these medications   acetaminophen 500 MG tablet Commonly known as: TYLENOL Take 1-2 tablets (500-1,000 mg total) by mouth every 6 (six) hours as needed for mild pain.   ALPRAZolam 0.5 MG tablet Commonly known as: Xanax Take 1 tablet (0.5 mg total) by mouth 2 (two) times daily as needed for anxiety. What changed: reasons to take this   DULoxetine 30 MG capsule Commonly known as: CYMBALTA Take 2 capsules by mouth in the morning (60 MG) and take 1 capsule (30 MG) in the evenings. What changed:   how much to take  how to take this  when to take this  additional instructions   fish oil-omega-3 fatty acids 1000 MG capsule Take 2 g by mouth daily.   glyBURIDE micronized 1.5 MG tablet Commonly known as: GLYNASE Take 1 tablet (1.5 mg total) by mouth daily. What changed: how much to take   lisinopril 20 MG tablet  Commonly known as: ZESTRIL Take 1 tablet (20 mg total) by mouth daily.   ondansetron 4 MG tablet Commonly known as:  Zofran Take 1 tablet (4 mg total) by mouth every 6 (six) hours as needed for nausea or vomiting.   oxyCODONE 5 MG immediate release tablet Commonly known as: Oxy IR/ROXICODONE Take 1 tablet (5 mg total) by mouth every 6 (six) hours as needed for severe pain.         Follow-up Information    [facility], [place]. Call on 08/22/2020.   Specialty: General Surgery Why: 9:00am, arrive by 8:30am for paperwork and check in process Contact information: [address] [place] [address] [phone]              Signed: Franne Forts, [facility] 07/31/2020, 12:23 PM Please see Amion for pager number during day hours 7:00am-4:30pm

## 2020-07-31 NOTE — Discharge Instructions (Signed)
[facility]  Please arrive at least 30 min before your appointment to complete your check in paperwork.  If you are unable to arrive 30 min prior to your appointment time we may have to cancel or reschedule you. LAPAROSCOPIC SURGERY: POST OP INSTRUCTIONS Always review your discharge instruction sheet given to you by the facility where your surgery was performed. IF YOU HAVE DISABILITY OR FAMILY LEAVE FORMS, YOU MUST BRING THEM TO THE OFFICE FOR PROCESSING.   DO NOT GIVE THEM TO YOUR DOCTOR.  PAIN CONTROL  1. First take acetaminophen (Tylenol) AND/or ibuprofen (Advil) to control your pain after surgery.  Follow directions on package.  Taking acetaminophen (Tylenol) and/or ibuprofen (Advil) regularly after surgery will help to control your pain and lower the amount of prescription pain medication you may need.  You should not take more than 4,000 mg (4 grams) of acetaminophen (Tylenol) in 24 hours.  You should not take ibuprofen (Advil), aleve, motrin, naprosyn or other NSAIDS if you have a history of stomach ulcers or chronic kidney disease.  2. A prescription for pain medication may be given to you upon discharge.  Take your pain medication as prescribed, if you still have uncontrolled pain after taking acetaminophen (Tylenol) or ibuprofen (Advil). 3. Use ice packs to help control pain. 4. If you need a refill on your pain medication, please contact your pharmacy.  They will contact our office to request authorization. Prescriptions will not be filled after 5pm or on week-ends.  HOME MEDICATIONS 5. Take your usually prescribed medications unless otherwise directed.  DIET 6. You should follow a light diet the first few days after arrival home.  Be sure to include lots of fluids daily. Avoid fatty, fried foods.   CONSTIPATION 7. It is common to experience some constipation after surgery and if you are taking pain medication.  Increasing fluid intake and taking a stool  softener (such as Colace) will usually help or prevent this problem from occurring.  A mild laxative (Milk of Magnesia or Miralax) should be taken according to package instructions if there are no bowel movements after 48 hours.  WOUND/INCISION CARE 8. Most patients will experience some swelling and bruising in the area of the incisions.  Ice packs will help.  Swelling and bruising can take several days to resolve.  9. Unless discharge instructions indicate otherwise, follow guidelines below  a. STERI-STRIPS - you may remove your outer bandages 48 hours after surgery, and you may shower at that time.  You have steri-strips (small skin tapes) in place directly over the incision.  These strips should be left on the skin for 7-10 days.   b. DERMABOND/SKIN GLUE - you may shower in 24 hours.  The glue will flake off over the next 2-3 weeks. 10. Any sutures or staples will be removed at the office during your follow-up visit.  ACTIVITIES 11. You may resume regular (light) daily activities beginning the next day--such as daily self-care, walking, climbing stairs--gradually increasing activities as tolerated.  You may have sexual intercourse when it is comfortable.  Refrain from any heavy lifting or straining until approved by your doctor. a. You may drive when you are no longer taking prescription pain medication, you can comfortably wear a seatbelt, and you can safely maneuver your car and apply brakes.  FOLLOW-UP 12. You should see your doctor in the office for a follow-up appointment approximately 2-3 weeks after your surgery.  You should have been given your post-op/follow-up appointment when   your surgery was scheduled.  If you did not receive a post-op/follow-up appointment, make sure that you call for this appointment within a day or two after you arrive home to insure a convenient appointment time.   WHEN TO CALL YOUR DOCTOR: 1. Fever over 101.0 2. Inability to urinate 3. Continued bleeding from  incision. 4. Increased pain, redness, or drainage from the incision. 5. Increasing abdominal pain  The clinic staff is available to answer your questions during regular business hours.  Please don't hesitate to call and ask to speak to one of the nurses for clinical concerns.  If you have a medical emergency, go to the nearest emergency room or call 911.  A surgeon from [facility] is always on call at the hospital. [address], [address], [place], [place]  [address] ? [address] [address], [place], [place]   [address] [phone] ? [phone] ? FAX [phone]  .........   Managing Your Pain After Surgery Without Opioids    Thank you for participating in our program to help patients manage their pain after surgery without opioids. This is part of our effort to provide you with the best care possible, without exposing you or your family to the risk that opioids pose.  What pain can I expect after surgery? You can expect to have some pain after surgery. This is normal. The pain is typically worse the day after surgery, and quickly begins to get better. Many studies have found that many patients are able to manage their pain after surgery with Over-the-Counter (OTC) medications such as Tylenol and Motrin. If you have a condition that does not allow you to take Tylenol or Motrin, notify your surgical team.  How will I manage my pain? The best strategy for controlling your pain after surgery is around the clock pain control with Tylenol (acetaminophen) and Motrin (ibuprofen or Advil). Alternating these medications with each other allows you to maximize your pain control. In addition to Tylenol and Motrin, you can use heating pads or ice packs on your incisions to help reduce your pain.  How will I alternate your regular strength over-the-counter pain medication? You will take a dose of pain medication every three hours. ; Start by taking 650 mg of Tylenol (2 pills of 325  mg) ; 3 hours later take 600 mg of Motrin (3 pills of 200 mg) ; 3 hours after taking the Motrin take 650 mg of Tylenol ; 3 hours after that take 600 mg of Motrin.   - 1 -  See example - if your first dose of Tylenol is at 12:00 PM   12:00 PM Tylenol 650 mg (2 pills of 325 mg)  3:00 PM Motrin 600 mg (3 pills of 200 mg)  6:00 PM Tylenol 650 mg (2 pills of 325 mg)  9:00 PM Motrin 600 mg (3 pills of 200 mg)  Continue alternating every 3 hours   We recommend that you follow this schedule around-the-clock for at least 3 days after surgery, or until you feel that it is no longer needed. Use the table on the last page of this handout to keep track of the medications you are taking. Important: Do not take more than 3000mg of Tylenol or 3200mg of Motrin in a 24-hour period. Do not take ibuprofen/Motrin if you have a history of bleeding stomach ulcers, severe kidney disease, &/or actively taking a blood thinner  What if I still have pain? If you have pain that is not   controlled with the over-the-counter pain medications (Tylenol and Motrin or Advil) you might have what we call "breakthrough" pain. You will receive a prescription for a small amount of an opioid pain medication such as Oxycodone, Tramadol, or Tylenol with Codeine. Use these opioid pills in the first 24 hours after surgery if you have breakthrough pain. Do not take more than 1 pill every 4-6 hours.  If you still have uncontrolled pain after using all opioid pills, don't hesitate to call our staff using the number provided. We will help make sure you are managing your pain in the best way possible, and if necessary, we can provide a prescription for additional pain medication.   Day 1    Time  Name of Medication Number of pills taken  Amount of Acetaminophen  Pain Level   Comments  AM PM       AM PM       AM PM       AM PM       AM PM       AM PM       AM PM       AM PM       Total Daily amount of Acetaminophen Do not  take more than  3,000 mg per day      Day 2    Time  Name of Medication Number of pills taken  Amount of Acetaminophen  Pain Level   Comments  AM PM       AM PM       AM PM       AM PM       AM PM       AM PM       AM PM       AM PM       Total Daily amount of Acetaminophen Do not take more than  3,000 mg per day      Day 3    Time  Name of Medication Number of pills taken  Amount of Acetaminophen  Pain Level   Comments  AM PM       AM PM       AM PM       AM PM          AM PM       AM PM       AM PM       AM PM       Total Daily amount of Acetaminophen Do not take more than  3,000 mg per day      Day 4    Time  Name of Medication Number of pills taken  Amount of Acetaminophen  Pain Level   Comments  AM PM       AM PM       AM PM       AM PM       AM PM       AM PM       AM PM       AM PM       Total Daily amount of Acetaminophen Do not take more than  3,000 mg per day      Day 5    Time  Name of Medication Number of pills taken  Amount of Acetaminophen  Pain Level   Comments  AM PM       AM PM       AM   PM       AM PM       AM PM       AM PM       AM PM       AM PM       Total Daily amount of Acetaminophen Do not take more than  3,000 mg per day       Day 6    Time  Name of Medication Number of pills taken  Amount of Acetaminophen  Pain Level  Comments  AM PM       AM PM       AM PM       AM PM       AM PM       AM PM       AM PM       AM PM       Total Daily amount of Acetaminophen Do not take more than  3,000 mg per day      Day 7    Time  Name of Medication Number of pills taken  Amount of Acetaminophen  Pain Level   Comments  AM PM       AM PM       AM PM       AM PM       AM PM       AM PM       AM PM       AM PM       Total Daily amount of Acetaminophen Do not take more than  3,000 mg per day        For additional information about how and where to safely dispose of unused  opioid medications - https://www.morepowerfulnc.org  Disclaimer: This document contains information and/or instructional materials adapted from [facility] for the typical patient with your condition. It does not replace medical advice from your health care provider because your experience may differ from that of the typical patient. Talk to your health care provider if you have any questions about this document, your condition or your treatment plan. Adapted from [facility]   

## 2020-07-31 NOTE — Anesthesia Preprocedure Evaluation (Addendum)
Anesthesia Evaluation  Patient identified by MRN, date of birth, ID band Patient awake    Reviewed: Allergy & Precautions, NPO status , Patient's Chart, lab work & pertinent test results  Airway Mallampati: III  TM Distance: >3 FB Neck ROM: Full    Dental  (+) Missing   Pulmonary Current Smoker and Patient abstained from smoking.,    Pulmonary exam normal breath sounds clear to auscultation       Cardiovascular hypertension, Pt. on medications Normal cardiovascular exam Rhythm:Regular Rate:Normal     Neuro/Psych  Headaches, PSYCHIATRIC DISORDERS Anxiety Bipolar Disorder    GI/Hepatic negative GI ROS, (+)     substance abuse  ,   Endo/Other  diabetes, Oral Hypoglycemic Agents  Renal/GU negative Renal ROS     Musculoskeletal negative musculoskeletal ROS (+)   Abdominal (+) + obese,   Peds  Hematology negative hematology ROS (+)   Anesthesia Other Findings Acute Appendicitis  Reproductive/Obstetrics                            Anesthesia Physical Anesthesia Plan  ASA: III  Anesthesia Plan: General   Post-op Pain Management:    Induction: Intravenous  PONV Risk Score and Plan: 2 and Ondansetron, Dexamethasone, Midazolam and Treatment may vary due to age or medical condition  Airway Management Planned: Oral ETT  Additional Equipment:   Intra-op Plan:   Post-operative Plan: Extubation in OR  Informed Consent: I have reviewed the patients History and Physical, chart, labs and discussed the procedure including the risks, benefits and alternatives for the proposed anesthesia with the patient or authorized representative who has indicated his/her understanding and acceptance.     Dental advisory given  Plan Discussed with: CRNA  Anesthesia Plan Comments:        Anesthesia Quick Evaluation

## 2020-07-31 NOTE — ED Notes (Signed)
All belonging given to girlfriend

## 2020-07-31 NOTE — Anesthesia Procedure Notes (Signed)
Procedure Name: Intubation Date/Time: 07/31/2020 11:36 AM Performed by: Oletta Lamas, CRNA Pre-anesthesia Checklist: Patient identified, Emergency Drugs available, Suction available and Patient being monitored Patient Re-evaluated:Patient Re-evaluated prior to induction Oxygen Delivery Method: Circle System Utilized Preoxygenation: Pre-oxygenation with 100% oxygen Induction Type: IV induction Ventilation: Mask ventilation without difficulty Laryngoscope Size: Mac and 4 Grade View: Grade I Tube type: Oral Number of attempts: 1 Airway Equipment and Method: Stylet and Oral airway Placement Confirmation: ETT inserted through vocal cords under direct vision,  positive ETCO2 and breath sounds checked- equal and bilateral Secured at: 23 cm Tube secured with: Tape Dental Injury: Teeth and Oropharynx as per pre-operative assessment

## 2020-07-31 NOTE — Anesthesia Postprocedure Evaluation (Signed)
Anesthesia Post Note  Patient: [name] [name]  Procedure(s) Performed: APPENDECTOMY LAPAROSCOPIC (N/A Abdomen)     Patient location during evaluation: PACU Anesthesia Type: General Level of consciousness: awake Pain management: pain level controlled Vital Signs Assessment: post-procedure vital signs reviewed and stable Respiratory status: spontaneous breathing, nonlabored ventilation, respiratory function stable and patient connected to nasal cannula oxygen Cardiovascular status: blood pressure returned to baseline and stable Postop Assessment: no apparent nausea or vomiting Anesthetic complications: no   No complications documented.  Last Vitals:  Vitals:   07/31/20 1323 07/31/20 1330  BP: 122/73   Pulse: 83 79  Resp: 16 15  Temp: 36.5 C   SpO2: 93% 94%    Last Pain:  Vitals:   07/31/20 1323  TempSrc:   PainSc: Asleep                 Trevion Hoben P Jupiter Kabir

## 2020-07-31 NOTE — Transfer of Care (Signed)
Immediate Anesthesia Transfer of Care Note  Patient: [name] [name]  Procedure(s) Performed: APPENDECTOMY LAPAROSCOPIC (N/A Abdomen)  Patient Location: PACU  Anesthesia Type: General  Level of Consciousness: awake, oriented and patient cooperative  Airway & Oxygen Therapy: Patient Spontanous Breathing and Patient connected to nasal cannula oxygen  Post-op Assessment: Report given to RN and Post -op Vital signs reviewed and stable  Post vital signs: Reviewed and stable  Last Vitals:  Vitals Value Taken Time  BP    Temp    Pulse 97 07/31/20 1226  Resp 17 07/31/20 1226  SpO2 97 % 07/31/20 1226  Vitals shown include unvalidated device data.  Last Pain:  Vitals:   07/31/20 0945  TempSrc: Oral  PainSc:          Complications: No complications documented.

## 2020-08-01 ENCOUNTER — Encounter (HOSPITAL_COMMUNITY): Payer: Self-pay | Admitting: Surgery

## 2020-08-01 LAB — SURGICAL PATHOLOGY

## 2020-08-11 ENCOUNTER — Other Ambulatory Visit: Payer: Self-pay

## 2020-08-11 ENCOUNTER — Telehealth (INDEPENDENT_AMBULATORY_CARE_PROVIDER_SITE_OTHER): Payer: No Payment, Other | Admitting: Physician Assistant

## 2020-08-11 DIAGNOSIS — F330 Major depressive disorder, recurrent, mild: Secondary | ICD-10-CM

## 2020-08-11 DIAGNOSIS — F411 Generalized anxiety disorder: Secondary | ICD-10-CM | POA: Diagnosis not present

## 2020-08-11 MED ORDER — DULOXETINE HCL 30 MG PO CPEP: 60.0000 mg | ORAL_CAPSULE | Freq: Two times a day (BID) | ORAL | 2 refills | Status: DC
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
## 2020-09-24 ENCOUNTER — Encounter (HOSPITAL_COMMUNITY): Payer: Self-pay | Admitting: Physician Assistant

## 2020-10-13 ENCOUNTER — Other Ambulatory Visit: Payer: Self-pay

## 2020-10-13 ENCOUNTER — Telehealth (INDEPENDENT_AMBULATORY_CARE_PROVIDER_SITE_OTHER): Payer: No Payment, Other | Admitting: Physician Assistant

## 2020-10-13 ENCOUNTER — Encounter (HOSPITAL_COMMUNITY): Payer: Self-pay | Admitting: Physician Assistant

## 2020-10-13 DIAGNOSIS — F411 Generalized anxiety disorder: Secondary | ICD-10-CM | POA: Diagnosis not present

## 2020-10-13 DIAGNOSIS — F330 Major depressive disorder, recurrent, mild: Secondary | ICD-10-CM

## 2020-10-13 DIAGNOSIS — F3340 Major depressive disorder, recurrent, in remission, unspecified: Secondary | ICD-10-CM | POA: Insufficient documentation

## 2020-10-13 MED ORDER — DULOXETINE HCL 30 MG PO CPEP: 60.0000 mg | ORAL_CAPSULE | Freq: Two times a day (BID) | ORAL | 2 refills | Status: DC

## 2020-10-13 NOTE — Progress Notes (Signed)
BH MD/PA/NP OP Progress Note  Virtual Visit via Video Note  I connected with [name] [name] on 10/13/20 at  1:00 PM EST by a video enabled telemedicine application and verified that I am speaking with the correct person using two identifiers.  Location: Patient: Home Provider: Clinic   I discussed the limitations of evaluation and management by telemedicine and the availability of in person appointments. The patient expressed understanding and agreed to proceed.  Follow Up Instructions:   I discussed the assessment and treatment plan with the patient. The patient was provided an opportunity to ask questions and all were answered. The patient agreed with the plan and demonstrated an understanding of the instructions.   The patient was advised to call back or seek an in-person evaluation if the symptoms worsen or if the condition fails to improve as anticipated.  I provided 15 minutes of non-face-to-face time during this encounter.   Meta Hatchet, PA   10/13/2020 1:13 PM [name] [name]  MRN:  [id number]  Chief Complaint: Follow up and medication management  HPI:  [name] [name] is a 45 year old male with a past psychiatric history significant for major depressive disorder and anxiety who presents to [facility] via virtual video visit for follow-up and medication management.  Patient is currently being managed on the following medication: Duloxetine 60 mg 2 times daily.  Patient reports no issues or concerns with his current medication regimen.  Patient denies any need for dosage adjustments at this time.  Patient is requesting refill on his current medication.  A PHQ 9 screen was performed with the patient scoring a 0.  A GAD-7 screen was also performed with the patient scoring a 2.  Patient is pleasant, calm, cooperative, and fully engaged in conversation during the encounter.  Patient reports that he is in a good mood and hopes that  the weather this weekend is nice so that he can do yard work.  Patient denies suicidal or homicidal ideations.  He further denies auditory or visual hallucinations.  Patient endorses good sleep and receives on average 6 to 7 hours of restful sleep each night.  Patient endorses good appetite and eats 2 meals each day.  Patient endorses alcohol consumption and states that he has a 12 pack of beer per week.  Patient endorses tobacco use and smokes a pack a day.  Patient denies illicit drug use.  Visit Diagnosis:    ICD-10-CM   1. Mild episode of recurrent major depressive disorder (HCC)  F33.0 DULoxetine (CYMBALTA) 30 MG capsule  2. Anxiety state  F41.1 DULoxetine (CYMBALTA) 30 MG capsule    Past Psychiatric History:  Anxiety Depression - patient states that he used to be manic depressive  Past Medical History:  Past Medical History:  Diagnosis Date  . Alcohol abuse   . Bipolar disorder [facility])    sees [facility]   . Diabetes mellitus without complication (HCC)   . Headache(784.0)   . Hypertension   . Obesity 10/24/2012    Past Surgical History:  Procedure Laterality Date  . LAPAROSCOPIC APPENDECTOMY N/A 07/31/2020   Procedure: APPENDECTOMY LAPAROSCOPIC;  Surgeon: Manus Rudd, MD;  Location: [facility] OR;  Service: General;  Laterality: N/A;    Family Psychiatric History:  None  Family History:  Family History  Problem Relation Age of Onset  . Hypertension Other   . Cancer Other        lung  . Depression Other  Social History:  Social History   Socioeconomic History  . Marital status: Single    Spouse name: Not on file  . Number of children: Not on file  . Years of education: Not on file  . Highest education level: Not on file  Occupational History  . Not on file  Tobacco Use  . Smoking status: Current Every Day Smoker    Packs/day: 1.00    Types: Cigarettes  . Smokeless tobacco: Never Used  Substance and Sexual Activity  . Alcohol use: Yes     Alcohol/week: 0.0 standard drinks    Comment: occ  . Drug use: No  . Sexual activity: Not on file  Other Topics Concern  . Not on file  Social History Narrative  . Not on file   Social Determinants of Health   Financial Resource Strain: Not on file  Food Insecurity: Not on file  Transportation Needs: Not on file  Physical Activity: Not on file  Stress: Not on file  Social Connections: Not on file    Allergies: No Known Allergies  Metabolic Disorder Labs: Lab Results  Component Value Date   HGBA1C 9.4 (H) 01/27/2020   MPG 197 (H) 10/03/2011   No results found for: PROLACTIN Lab Results  Component Value Date   CHOL 240 (H) 01/27/2020   TRIG 219.0 (H) 01/27/2020   HDL 45.90 01/27/2020   CHOLHDL 5 01/27/2020   VLDL 43.8 (H) 01/27/2020   LDLCALC 145 (H) 02/15/2015   LDLCALC 161 (H) 01/13/2014   Lab Results  Component Value Date   TSH 1.95 01/27/2020   TSH 1.09 10/25/2016    Therapeutic Level Labs: No results found for: LITHIUM No results found for: VALPROATE No components found for:  CBMZ  Current Medications: Current Outpatient Medications  Medication Sig Dispense Refill  . acetaminophen (TYLENOL) 500 MG tablet Take 1-2 tablets (500-1,000 mg total) by mouth every 6 (six) hours as needed for mild pain. 30 tablet 0  . ALPRAZolam (XANAX) 0.5 MG tablet Take 1 tablet (0.5 mg total) by mouth 2 (two) times daily as needed for anxiety. (Patient taking differently: Take 0.5 mg by mouth 2 (two) times daily as needed for anxiety (panic attack). ) 60 tablet 0  . DULoxetine (CYMBALTA) 30 MG capsule Take 2 capsules (60 mg total) by mouth 2 (two) times daily. 120 capsule 2  . fish oil-omega-3 fatty acids 1000 MG capsule Take 2 g by mouth daily.    Marland Kitchen glyBURIDE micronized (GLYNASE) 1.5 MG tablet Take 1 tablet (1.5 mg total) by mouth daily. (Patient taking differently: Take 0.75 mg by mouth daily. ) 90 tablet 3  . lisinopril (ZESTRIL) 20 MG tablet Take 1 tablet (20 mg total) by  mouth daily. 90 tablet 3  . ondansetron (ZOFRAN) 4 MG tablet Take 1 tablet (4 mg total) by mouth every 6 (six) hours as needed for nausea or vomiting. 15 tablet 1  . oxyCODONE (OXY IR/ROXICODONE) 5 MG immediate release tablet Take 1 tablet (5 mg total) by mouth every 6 (six) hours as needed for severe pain. 15 tablet 0   No current facility-administered medications for this visit.     Musculoskeletal: Strength & Muscle Tone: Unable to assess due to telemedicine visit Gait & Station: Unable to assess due to telemedicine visit Patient leans: Unable to assess due to telemedicine visit  Psychiatric Specialty Exam: Review of Systems  Psychiatric/Behavioral: Negative for decreased concentration, dysphoric mood, hallucinations, self-injury, sleep disturbance and suicidal ideas. The patient is not nervous/anxious  and is not hyperactive.     There were no vitals taken for this visit.There is no height or weight on file to calculate BMI.  General Appearance: Well Groomed  Eye Contact:  Good  Speech:  Clear and Coherent and Normal Rate  Volume:  Normal  Mood:  Euthymic  Affect:  Appropriate  Thought Process:  Coherent and Descriptions of Associations: Intact  Orientation:  Full (Time, Place, and Person)  Thought Content: WDL   Suicidal Thoughts:  No  Homicidal Thoughts:  No  Memory:  Immediate;   Good Recent;   Good Remote;   Good  Judgement:  Good  Insight:  Good  Psychomotor Activity:  Normal  Concentration:  Concentration: Good and Attention Span: Good  Recall:  Good  Fund of Knowledge: Good  Language: Good  Akathisia:  NA  Handed:  Right  AIMS (if indicated): not done  Assets:  Communication Skills Desire for Improvement Social Support Vocational/Educational  ADL's:  Intact  Cognition: WNL  Sleep:  Good   Screenings: AUDIT   Flowsheet Row [facility]  Alcohol Use Disorder Identification Test Final  Score (AUDIT) 16    GAD-7   Flowsheet Row Video Visit from 10/13/2020 in [facility] Video Visit from 08/11/2020 in [facility]  Total GAD-7 Score 2 3    PHQ2-9   Flowsheet Row Video Visit from 10/13/2020 in [facility] Video Visit from 08/11/2020 in [facility]  PHQ-2 Total Score 0 0    Flowsheet Row [facility] Visit from 10/13/2020 in [facility]  C-SSRS RISK CATEGORY No Risk       Assessment and Plan:  Manoah Deckard. Cianci is a 44 year old male with a past psychiatric history significant for major depressive disorder and anxiety who presents to [facility] via virtual video visit for follow-up and medication management.  Patient is currently being managed on the following medication: Duloxetine 60 mg 2 times daily.  Patient reports no issues or concerns with his current medication regimen.  1. Mild episode of recurrent major depressive disorder (HCC)  - DULoxetine (CYMBALTA) 30 MG capsule; Take 2 capsules (60 mg total) by mouth 2 (two) times daily.  Dispense: 120 capsule; Refill: 2  2. Anxiety state  - DULoxetine (CYMBALTA) 30 MG capsule; Take 2 capsules (60 mg total) by mouth 2 (two) times daily.  Dispense: 120 capsule; Refill: 2  Patient to follow up in 3 months  Meta Hatchet, PA 10/13/2020, 1:13 PM
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
## 2020-12-11 ENCOUNTER — Other Ambulatory Visit: Payer: Self-pay | Admitting: Family Medicine

## 2020-12-12 NOTE — Telephone Encounter (Signed)
Pt needs physical appointment for further refills

## 2021-01-12 ENCOUNTER — Encounter (HOSPITAL_COMMUNITY): Payer: Self-pay | Admitting: Physician Assistant

## 2021-01-12 ENCOUNTER — Telehealth (INDEPENDENT_AMBULATORY_CARE_PROVIDER_SITE_OTHER): Payer: No Payment, Other | Admitting: Physician Assistant

## 2021-01-12 ENCOUNTER — Other Ambulatory Visit: Payer: Self-pay

## 2021-01-12 DIAGNOSIS — F330 Major depressive disorder, recurrent, mild: Secondary | ICD-10-CM

## 2021-01-12 DIAGNOSIS — F411 Generalized anxiety disorder: Secondary | ICD-10-CM

## 2021-01-12 MED ORDER — DULOXETINE HCL 30 MG PO CPEP: 60.0000 mg | ORAL_CAPSULE | Freq: Two times a day (BID) | ORAL | 2 refills | Status: DC

## 2021-01-12 NOTE — Progress Notes (Signed)
BH MD/PA/NP OP Progress Note  Virtual Visit via Telephone Note  I connected with [name] [name] on 01/12/21 at  1:00 PM EDT by telephone and verified that I am speaking with the correct person using two identifiers.  Location: Patient: Home Provider: Clinic   I discussed the limitations, risks, security and privacy concerns of performing an evaluation and management service by telephone and the availability of in person appointments. I also discussed with the patient that there may be a patient responsible charge related to this service. The patient expressed understanding and agreed to proceed.  Follow Up Instructions:  I discussed the assessment and treatment plan with the patient. The patient was provided an opportunity to ask questions and all were answered. The patient agreed with the plan and demonstrated an understanding of the instructions.   The patient was advised to call back or seek an in-person evaluation if the symptoms worsen or if the condition fails to improve as anticipated.  I provided 15 minutes of non-face-to-face time during this encounter.   Meta Hatchet, PA   01/12/2021 11:03 PM [name] [name]  MRN:  [id number]  Chief Complaint: Follow up and medication management  HPI:   [name] [name] is a 45 year old male with a past psychiatric history significant for anxiety and major depressive disorder who presents to [facility] for follow-up and medication management.  Patient is currently being managed on the following medication: Duloxetine 60 mg 2 times daily.  Patient reports no issues or concerns regarding his current medication regimen.  Patient denies a need for dosage adjustments at this time and is requesting refills on his medication.  Patient denies any new stressors or life changing events.  He reports no issues or concerns regarding his mental health.  A GAD-7 screen was performed with the patient scoring a  2.  Patient is pleasant, calm, cooperative, and fully engaged in conversation during the encounter.  Patient reports being in a good mood.  He states that he plans on installing a hot tub this weekend.  Patient denies suicidal or homicidal ideations.  He further denies auditory or visual hallucinations and does not appear to be responding to internal/external stimuli.  Patient endorses good sleep and receives on average 7 hours of sleep each night.  Patient endorses good appetite and eats on average 2 meals per day.  Patient endorses alcohol consumption occasionally.  Patient endorses tobacco use and smokes on average a pack per day.  Patient denies illicit drug use.  Visit Diagnosis:    ICD-10-CM   1. Mild episode of recurrent major depressive disorder (HCC)  F33.0 DULoxetine (CYMBALTA) 30 MG capsule  2. Anxiety state  F41.1 DULoxetine (CYMBALTA) 30 MG capsule    Past Psychiatric History:  Anxiety Depression - patient states that he used to be manic depressive  Past Medical History:  Past Medical History:  Diagnosis Date  . Alcohol abuse   . Bipolar disorder [facility])    sees [place] [facility]   . Diabetes mellitus without complication (HCC)   . Headache(784.0)   . Hypertension   . Obesity 10/24/2012    Past Surgical History:  Procedure Laterality Date  . LAPAROSCOPIC APPENDECTOMY N/A 07/31/2020   Procedure: APPENDECTOMY LAPAROSCOPIC;  Surgeon: Manus Rudd, MD;  Location: [facility] OR;  Service: General;  Laterality: N/A;    Family Psychiatric History:  None  Family History:  Family History  Problem Relation Age of Onset  . Hypertension Other   .  Cancer Other        lung  . Depression Other     Social History:  Social History   Socioeconomic History  . Marital status: Single    Spouse name: Not on file  . Number of children: Not on file  . Years of education: Not on file  . Highest education level: Not on file  Occupational History  . Not on file  Tobacco Use  .  Smoking status: Current Every Day Smoker    Packs/day: 1.00    Types: Cigarettes  . Smokeless tobacco: Never Used  Substance and Sexual Activity  . Alcohol use: Yes    Alcohol/week: 0.0 standard drinks    Comment: occ  . Drug use: No  . Sexual activity: Not on file  Other Topics Concern  . Not on file  Social History Narrative  . Not on file   Social Determinants of Health   Financial Resource Strain: Not on file  Food Insecurity: Not on file  Transportation Needs: Not on file  Physical Activity: Not on file  Stress: Not on file  Social Connections: Not on file    Allergies: No Known Allergies  Metabolic Disorder Labs: Lab Results  Component Value Date   HGBA1C 9.4 (H) 01/27/2020   MPG 197 (H) 10/03/2011   No results found for: PROLACTIN Lab Results  Component Value Date   CHOL 240 (H) 01/27/2020   TRIG 219.0 (H) 01/27/2020   HDL 45.90 01/27/2020   CHOLHDL 5 01/27/2020   VLDL 43.8 (H) 01/27/2020   LDLCALC 145 (H) 02/15/2015   LDLCALC 161 (H) 01/13/2014   Lab Results  Component Value Date   TSH 1.95 01/27/2020   TSH 1.09 10/25/2016    Therapeutic Level Labs: No results found for: LITHIUM No results found for: VALPROATE No components found for:  CBMZ  Current Medications: Current Outpatient Medications  Medication Sig Dispense Refill  . acetaminophen (TYLENOL) 500 MG tablet Take 1-2 tablets (500-1,000 mg total) by mouth every 6 (six) hours as needed for mild pain. 30 tablet 0  . ALPRAZolam (XANAX) 0.5 MG tablet Take 1 tablet (0.5 mg total) by mouth 2 (two) times daily as needed for anxiety. (Patient taking differently: Take 0.5 mg by mouth 2 (two) times daily as needed for anxiety (panic attack). ) 60 tablet 0  . DULoxetine (CYMBALTA) 30 MG capsule Take 2 capsules (60 mg total) by mouth 2 (two) times daily. 120 capsule 2  . fish oil-omega-3 fatty acids 1000 MG capsule Take 2 g by mouth daily.    Marland Kitchen glyBURIDE micronized (GLYNASE) 1.5 MG tablet Take 1 tablet  by mouth daily 30 tablet 0  . lisinopril (ZESTRIL) 20 MG tablet Take 1 tablet (20 mg total) by mouth daily. 90 tablet 3  . ondansetron (ZOFRAN) 4 MG tablet Take 1 tablet (4 mg total) by mouth every 6 (six) hours as needed for nausea or vomiting. 15 tablet 1  . oxyCODONE (OXY IR/ROXICODONE) 5 MG immediate release tablet Take 1 tablet (5 mg total) by mouth every 6 (six) hours as needed for severe pain. 15 tablet 0   No current facility-administered medications for this visit.     Musculoskeletal: Strength & Muscle Tone: Unable to assess due to telemedicine visit Gait & Station: Unable to assess due to telemedicine visit Patient leans: Unable to assess due to telemedicine visit  Psychiatric Specialty Exam: Review of Systems  Psychiatric/Behavioral: Negative for decreased concentration, dysphoric mood, hallucinations, self-injury, sleep disturbance and suicidal ideas.  The patient is not nervous/anxious and is not hyperactive.     There were no vitals taken for this visit.There is no height or weight on file to calculate BMI.  General Appearance: Unable to assess due to telemedicine visit  Eye Contact:  Unable to assess due to telemedicine visit  Speech:  Clear and Coherent and Normal Rate  Volume:  Normal  Mood:  Euthymic  Affect:  Appropriate  Thought Process:  Coherent and Descriptions of Associations: Intact  Orientation:  Full (Time, Place, and Person)  Thought Content: WDL and Logical   Suicidal Thoughts:  No  Homicidal Thoughts:  No  Memory:  Immediate;   Good Recent;   Good Remote;   Good  Judgement:  Good  Insight:  Good  Psychomotor Activity:  Normal  Concentration:  Concentration: Good and Attention Span: Good  Recall:  Good  Fund of Knowledge: Good  Language: Good  Akathisia:  NA  Handed:  Right  AIMS (if indicated): not done  Assets:  Communication Skills Desire for Improvement Social Support Vocational/Educational  ADL's:  Intact  Cognition: WNL  Sleep:  Good    Screenings: AUDIT   Flowsheet Row [facility]  Alcohol Use Disorder Identification Test Final Score (AUDIT) 16    GAD-7   Flowsheet Row Video Visit from 01/12/2021 in [facility] Video Visit from 10/13/2020 in [facility] Video Visit from 08/11/2020 in [facility]  Total GAD-7 Score 2 2 3     PHQ2-9   Flowsheet Row Video Visit from 01/12/2021 in [facility] Video Visit from 10/13/2020 in [facility] Video Visit from 08/11/2020 in [place]  PHQ-2 Total Score 0 0 0    Flowsheet Row Video Visit from 01/12/2021 in [facility] Video Visit from 10/13/2020 in [facility]  C-SSRS RISK CATEGORY No Risk No Risk       Assessment and Plan:   [name] [name] is a 45 year old male with a past psychiatric history significant for anxiety and major depressive disorder who presents to [facility] for follow-up and medication management.  Patient reports no issues or concerns regarding his current medication regimen.  Patient denies the need for dosage adjustments at this time and is requesting refills on his medication.  Patient's medication to be e-prescribed to pharmacy of choice.  1. Mild episode of recurrent major depressive disorder (HCC)  - DULoxetine (CYMBALTA) 30 MG capsule; Take 2 capsules (60 mg total) by mouth 2 (two) times daily.  Dispense: 120 capsule; Refill: 2  2. Anxiety state  - DULoxetine (CYMBALTA) 30 MG capsule; Take 2 capsules (60 mg total) by mouth 2 (two) times daily.  Dispense: 120 capsule; Refill: 2  Patient to follow-up in 3 months  [facility], PA 01/12/2021, 11:03 PM

## 2021-03-06 ENCOUNTER — Telehealth: Payer: Self-pay | Admitting: Family Medicine

## 2021-03-06 ENCOUNTER — Other Ambulatory Visit: Payer: Self-pay

## 2021-03-06 MED ORDER — GLYBURIDE MICRONIZED 1.5 MG PO TABS
1.5000 mg | ORAL_TABLET | Freq: Every day | ORAL | 0 refills | Status: DC
Start: 2021-03-06 — End: 2021-03-15

## 2021-03-06 NOTE — Telephone Encounter (Signed)
Pt call and stated he need a refill on glyBURIDE micronized (GLYNASE) 1.5 MG tablet sent to [facility] in [place] he is changing pharmacy.

## 2021-03-06 NOTE — Telephone Encounter (Signed)
Refill sent to [facility] in [place] 

## 2021-03-14 ENCOUNTER — Other Ambulatory Visit: Payer: Self-pay

## 2021-03-15 ENCOUNTER — Ambulatory Visit (INDEPENDENT_AMBULATORY_CARE_PROVIDER_SITE_OTHER): Payer: Self-pay | Admitting: Family Medicine

## 2021-03-15 ENCOUNTER — Other Ambulatory Visit: Payer: Self-pay

## 2021-03-15 ENCOUNTER — Encounter: Payer: Self-pay | Admitting: Family Medicine

## 2021-03-15 VITALS — BP 122/84 | HR 84 | Temp 98.1°F | Ht 69.0 in | Wt 226.6 lb

## 2021-03-15 DIAGNOSIS — F411 Generalized anxiety disorder: Secondary | ICD-10-CM

## 2021-03-15 DIAGNOSIS — Z0000 Encounter for general adult medical examination without abnormal findings: Secondary | ICD-10-CM

## 2021-03-15 DIAGNOSIS — F330 Major depressive disorder, recurrent, mild: Secondary | ICD-10-CM

## 2021-03-15 LAB — T4, FREE: Free T4: 0.68 ng/dL (ref 0.60–1.60)

## 2021-03-15 LAB — HEMOGLOBIN A1C: Hgb A1c MFr Bld: 8.9 % — ABNORMAL HIGH (ref 4.6–6.5)

## 2021-03-15 LAB — CBC WITH DIFFERENTIAL/PLATELET
Basophils Absolute: 0 10*3/uL (ref 0.0–0.1)
Basophils Relative: 0.6 % (ref 0.0–3.0)
Eosinophils Absolute: 0.2 10*3/uL (ref 0.0–0.7)
Eosinophils Relative: 3.1 % (ref 0.0–5.0)
HCT: 48 % (ref 39.0–52.0)
Hemoglobin: 16.5 g/dL (ref 13.0–17.0)
Lymphocytes Relative: 31.8 % (ref 12.0–46.0)
Lymphs Abs: 1.8 10*3/uL (ref 0.7–4.0)
MCHC: 34.3 g/dL (ref 30.0–36.0)
MCV: 102.6 fl — ABNORMAL HIGH (ref 78.0–100.0)
Monocytes Absolute: 0.4 10*3/uL (ref 0.1–1.0)
Monocytes Relative: 7.3 % (ref 3.0–12.0)
Neutro Abs: 3.3 10*3/uL (ref 1.4–7.7)
Neutrophils Relative %: 57.2 % (ref 43.0–77.0)
Platelets: 165 10*3/uL (ref 150.0–400.0)
RBC: 4.68 Mil/uL (ref 4.22–5.81)
RDW: 12.5 % (ref 11.5–15.5)
WBC: 5.8 10*3/uL (ref 4.0–10.5)

## 2021-03-15 LAB — BASIC METABOLIC PANEL
BUN: 9 mg/dL (ref 6–23)
CO2: 25 mEq/L (ref 19–32)
Calcium: 9.4 mg/dL (ref 8.4–10.5)
Chloride: 99 mEq/L (ref 96–112)
Creatinine, Ser: 0.67 mg/dL (ref 0.40–1.50)
GFR: 114.12 mL/min (ref >60.00)
Glucose, Bld: 235 mg/dL — ABNORMAL HIGH (ref 70–99)
Potassium: 4.4 mEq/L (ref 3.5–5.1)
Sodium: 135 mEq/L (ref 135–145)

## 2021-03-15 LAB — HEPATIC FUNCTION PANEL
ALT: 26 U/L (ref 0–53)
AST: 24 U/L (ref 0–37)
Albumin: 4.1 g/dL (ref 3.5–5.2)
Alkaline Phosphatase: 85 U/L (ref 39–117)
Bilirubin, Direct: 0.1 mg/dL (ref 0.0–0.3)
Total Bilirubin: 0.4 mg/dL (ref 0.2–1.2)
Total Protein: 7.1 g/dL (ref 6.0–8.3)

## 2021-03-15 LAB — T3, FREE: T3, Free: 3.3 pg/mL (ref 2.3–4.2)

## 2021-03-15 LAB — LDL CHOLESTEROL, DIRECT: Direct LDL: 156 mg/dL

## 2021-03-15 LAB — LIPID PANEL
Cholesterol: 216 mg/dL — ABNORMAL HIGH (ref 0–200)
HDL: 37.4 mg/dL — ABNORMAL LOW (ref >39.00)
NonHDL: 178.65
Total CHOL/HDL Ratio: 6
Triglycerides: 246 mg/dL — ABNORMAL HIGH (ref 0.0–149.0)
VLDL: 49.2 mg/dL — ABNORMAL HIGH (ref 0.0–40.0)

## 2021-03-15 LAB — TSH: TSH: 1.64 u[IU]/mL (ref 0.35–5.50)

## 2021-03-15 MED ORDER — LISINOPRIL 20 MG PO TABS
20.0000 mg | ORAL_TABLET | Freq: Every day | ORAL | 3 refills | Status: DC
Start: 2021-03-15 — End: 2022-03-06

## 2021-03-15 MED ORDER — ATORVASTATIN CALCIUM 20 MG PO TABS: 20.0000 mg | ORAL_TABLET | Freq: Every day | ORAL | 3 refills | Status: DC

## 2021-03-15 MED ORDER — GLYBURIDE MICRONIZED 1.5 MG PO TABS
1.5000 mg | ORAL_TABLET | Freq: Every day | ORAL | 3 refills | Status: DC
Start: 2021-03-15 — End: 2022-06-19

## 2021-03-15 MED ORDER — DULOXETINE HCL 30 MG PO CPEP: 60.0000 mg | ORAL_CAPSULE | Freq: Two times a day (BID) | ORAL | 2 refills | Status: DC

## 2021-03-15 NOTE — Progress Notes (Signed)
   Subjective:    Patient ID: [name] [name], male    DOB: 12-04-1975, 45 y.o.   MRN: [id number]  HPI Here for a well exam. He feels fine. His glucoses average around 200. He insists that he feels terrible whenever his glucose gets below 180.    Review of Systems  Constitutional: Negative.   HENT: Negative.    Eyes: Negative.   Respiratory: Negative.    Cardiovascular: Negative.   Gastrointestinal: Negative.   Genitourinary: Negative.   Musculoskeletal: Negative.   Skin: Negative.   Neurological: Negative.   Psychiatric/Behavioral: Negative.        Objective:   Physical Exam Constitutional:      General: He is not in acute distress.    Appearance: Normal appearance. He is well-developed. He is not diaphoretic.  HENT:     Head: Normocephalic and atraumatic.     Right Ear: External ear normal.     Left Ear: External ear normal.     Nose: Nose normal.     Mouth/Throat:     Pharynx: No oropharyngeal exudate.  Eyes:     General: No scleral icterus.       Right eye: No discharge.        Left eye: No discharge.     Conjunctiva/sclera: Conjunctivae normal.     Pupils: Pupils are equal, round, and reactive to light.  Neck:     Thyroid: No thyromegaly.     Vascular: No JVD.     Trachea: No tracheal deviation.  Cardiovascular:     Rate and Rhythm: Normal rate and regular rhythm.     Heart sounds: Normal heart sounds. No murmur heard.   No friction rub. No gallop.  Pulmonary:     Effort: Pulmonary effort is normal. No respiratory distress.     Breath sounds: Normal breath sounds. No wheezing or rales.  Chest:     Chest wall: No tenderness.  Abdominal:     General: Bowel sounds are normal. There is no distension.     Palpations: Abdomen is soft. There is no mass.     Tenderness: There is no abdominal tenderness. There is no guarding or rebound.  Genitourinary:    Penis: Normal. No tenderness.      Testes: Normal.  Musculoskeletal:        General: No tenderness.  Normal range of motion.     Cervical back: Neck supple.  Lymphadenopathy:     Cervical: No cervical adenopathy.  Skin:    General: Skin is warm and dry.     Coloration: Skin is not pale.     Findings: No erythema or rash.  Neurological:     Mental Status: He is alert and oriented to person, place, and time.     Cranial Nerves: No cranial nerve deficit.     Motor: No abnormal muscle tone.     Coordination: Coordination normal.     Deep Tendon Reflexes: Reflexes are normal and symmetric. Reflexes normal.  Psychiatric:        Behavior: Behavior normal.        Thought Content: Thought content normal.        Judgment: Judgment normal.          Assessment & Plan:  Well exam. We discussed diet and exercise. Get fasting labs. Gershon Crane, MD

## 2021-04-13 ENCOUNTER — Other Ambulatory Visit: Payer: Self-pay

## 2021-04-13 ENCOUNTER — Telehealth (INDEPENDENT_AMBULATORY_CARE_PROVIDER_SITE_OTHER): Payer: No Payment, Other | Admitting: Physician Assistant

## 2021-04-13 DIAGNOSIS — F411 Generalized anxiety disorder: Secondary | ICD-10-CM | POA: Diagnosis not present

## 2021-04-13 DIAGNOSIS — F330 Major depressive disorder, recurrent, mild: Secondary | ICD-10-CM | POA: Diagnosis not present

## 2021-04-14 MED ORDER — DULOXETINE HCL 30 MG PO CPEP: 60.0000 mg | ORAL_CAPSULE | Freq: Two times a day (BID) | ORAL | 2 refills | Status: DC

## 2021-04-14 NOTE — Progress Notes (Signed)
BH MD/PA/NP OP Progress Note  Virtual Visit via Telephone Note  I connected with [name] on 04/14/21 at  1:00 PM EDT by telephone and verified that I am speaking with the correct person using two identifiers.  Location: Patient: Home Provider: Clinic   I discussed the limitations, risks, security and privacy concerns of performing an evaluation and management service by telephone and the availability of in person appointments. I also discussed with the patient that there may be a patient responsible charge related to this service. The patient expressed understanding and agreed to proceed.  Follow Up Instructions:   I discussed the assessment and treatment plan with the patient. The patient was provided an opportunity to ask questions and all were answered. The patient agreed with the plan and demonstrated an understanding of the instructions.   The patient was advised to call back or seek an in-person evaluation if the symptoms worsen or if the condition fails to improve as anticipated.  I provided 11 minutes of non-face-to-face time during this encounter.  Meta Hatchet, PA   04/14/2021 3:37 AM [name]  MRN:  [id number]  Chief Complaint: Follow up and medication management  HPI:   Lenard Kampf. Sainsbury is a 45 year old male with a past psychiatric history significant for major depressive disorder and anxiety who presents to [facility] via virtual telephone visit for follow-up and medication management.  Patient is currently being managed on the following medication: Duloxetine 30 mg 2 capsules by mouth 2 times daily.  Patient reports no issues or concerns regarding his current medication regimen.  Patient denies experiencing any depressive episodes or anxiety at this time.  Patient denies any new stressors and reports no other issues regarding his mental health.  A GAD-7 screen was performed with the patient scoring a  3.  Patient is alert and oriented x4, pleasant, calm, cooperative, and fully engaged in conversation during the encounter.  Patient endorses good mood.  Patient denies suicidal or homicidal ideations.  Patient further denies auditory or visual hallucinations and does not appear to be responding to internal/external stimuli.  Patient endorses good sleep and receives on average 7 to 8 hours of sleep each night.  Patient endorses good appetite and eats on average 2 meals a day.  Patient endorses alcohol consumption occasionally.  Patient endorses tobacco use and smokes on average a pack per day.  Patient denies illicit drug use.  Visit Diagnosis:    ICD-10-CM   1. Mild episode of recurrent major depressive disorder (HCC)  F33.0 DULoxetine (CYMBALTA) 30 MG capsule    2. Anxiety state  F41.1 DULoxetine (CYMBALTA) 30 MG capsule      Past Psychiatric History:  Anxiety Depression - patient states that he used to be manic depressive  Past Medical History:  Past Medical History:  Diagnosis Date   Alcohol abuse    Bipolar disorder (HCC)    sees [facility]    Diabetes mellitus without complication (HCC)    Headache(784.0)    Hypertension    Obesity 10/24/2012    Past Surgical History:  Procedure Laterality Date   LAPAROSCOPIC APPENDECTOMY N/A 07/31/2020   Procedure: APPENDECTOMY LAPAROSCOPIC;  Surgeon: Manus Rudd, MD;  Location: [facility] OR;  Service: General;  Laterality: N/A;    Family Psychiatric History:  None  Family History:  Family History  Problem Relation Age of Onset   Hypertension Other    Cancer Other  lung   Depression Other     Social History:  Social History   Socioeconomic History   Marital status: Single    Spouse name: Not on file   Number of children: Not on file   Years of education: Not on file   Highest education level: Not on file  Occupational History   Not on file  Tobacco Use   Smoking status: Every Day    Packs/day: 1.00    Types:  Cigarettes   Smokeless tobacco: Never  Substance and Sexual Activity   Alcohol use: Yes    Alcohol/week: 0.0 standard drinks    Comment: occ   Drug use: No   Sexual activity: Not on file  Other Topics Concern   Not on file  Social History Narrative   Not on file   Social Determinants of Health   Financial Resource Strain: Not on file  Food Insecurity: Not on file  Transportation Needs: Not on file  Physical Activity: Not on file  Stress: Not on file  Social Connections: Not on file    Allergies:  Allergies  Allergen Reactions   Metformin And Related Other (See Comments)    Abdominal cramps     Metabolic Disorder Labs: Lab Results  Component Value Date   HGBA1C 8.9 (H) 03/15/2021   MPG 197 (H) 10/03/2011   No results found for: PROLACTIN Lab Results  Component Value Date   CHOL 216 (H) 03/15/2021   TRIG 246.0 (H) 03/15/2021   HDL 37.40 (L) 03/15/2021   CHOLHDL 6 03/15/2021   VLDL 49.2 (H) 03/15/2021   LDLCALC 145 (H) 02/15/2015   LDLCALC 161 (H) 01/13/2014   Lab Results  Component Value Date   TSH 1.64 03/15/2021   TSH 1.95 01/27/2020    Therapeutic Level Labs: No results found for: LITHIUM No results found for: VALPROATE No components found for:  CBMZ  Current Medications: Current Outpatient Medications  Medication Sig Dispense Refill   acetaminophen (TYLENOL) 500 MG tablet Take 1-2 tablets (500-1,000 mg total) by mouth every 6 (six) hours as needed for mild pain. 30 tablet 0   ALPRAZolam (XANAX) 0.5 MG tablet Take 1 tablet (0.5 mg total) by mouth 2 (two) times daily as needed for anxiety. (Patient taking differently: Take 0.5 mg by mouth 2 (two) times daily as needed for anxiety (panic attack).) 60 tablet 0   atorvastatin (LIPITOR) 20 MG tablet Take 1 tablet (20 mg total) by mouth daily. 90 tablet 3   DULoxetine (CYMBALTA) 30 MG capsule Take 2 capsules (60 mg total) by mouth 2 (two) times daily. 120 capsule 2   fish oil-omega-3 fatty acids 1000 MG  capsule Take 2 g by mouth daily.     glyBURIDE micronized (GLYNASE) 1.5 MG tablet Take 1 tablet (1.5 mg total) by mouth daily. 90 tablet 3   lisinopril (ZESTRIL) 20 MG tablet Take 1 tablet (20 mg total) by mouth daily. 90 tablet 3   ondansetron (ZOFRAN) 4 MG tablet Take 1 tablet (4 mg total) by mouth every 6 (six) hours as needed for nausea or vomiting. 15 tablet 1   oxyCODONE (OXY IR/ROXICODONE) 5 MG immediate release tablet Take 1 tablet (5 mg total) by mouth every 6 (six) hours as needed for severe pain. 15 tablet 0   No current facility-administered medications for this visit.     Musculoskeletal: Strength & Muscle Tone: Unable to assess due to telemedicine visit Gait & Station: Unable to assess due to telemedicine visit Patient leans: Unable  to assess due to telemedicine visit  Psychiatric Specialty Exam: Review of Systems  Psychiatric/Behavioral:  Negative for decreased concentration, dysphoric mood, hallucinations, self-injury, sleep disturbance and suicidal ideas. The patient is not nervous/anxious and is not hyperactive.    There were no vitals taken for this visit.There is no height or weight on file to calculate BMI.  General Appearance: Unable to assess due to telemedicine visit  Eye Contact:  Unable to assess due to telemedicine visit  Speech:  Clear and Coherent and Normal Rate  Volume:  Normal  Mood:  Euthymic  Affect:  Appropriate  Thought Process:  Coherent and Descriptions of Associations: Intact  Orientation:  Full (Time, Place, and Person)  Thought Content: WDL   Suicidal Thoughts:  No  Homicidal Thoughts:  No  Memory:  Immediate;   Good Recent;   Good Remote;   Good  Judgement:  Good  Insight:  Good  Psychomotor Activity:  Normal  Concentration:  Concentration: Good and Attention Span: Good  Recall:  Good  Fund of Knowledge: Good  Language: Good  Akathisia:  NA  Handed:  Right  AIMS (if indicated): not done  Assets:  Communication Skills Desire for  Improvement Social Support Vocational/Educational  ADL's:  Intact  Cognition: WNL  Sleep:  Good   Screenings: AUDIT    Flowsheet Row [facility]  Alcohol Use Disorder Identification Test Final Score (AUDIT) 16      GAD-7    Flowsheet Row Video Visit from 04/13/2021 in [facility] Video Visit from 01/12/2021 in [facility] Video Visit from 10/13/2020 in [facility] Video Visit from 08/11/2020 in [facility]  Total GAD-7 Score 3 2 2 3       PHQ2-9    Flowsheet Row Video Visit from 04/13/2021 in [facility] Video Visit from 01/12/2021 in [facility] Video Visit from 10/13/2020 in [facility] Video Visit from 08/11/2020 in [facility]  PHQ-2 Total Score 1 0 0 0      Flowsheet Row Video Visit from 04/13/2021 in [facility] Video Visit from 01/12/2021 in [facility] Video Visit from 10/13/2020 in [facility]  C-SSRS RISK CATEGORY No Risk No Risk No Risk        Assessment and Plan:   Jaxyn Rout. Straw is a 45 year old male with a past psychiatric history significant for major depressive disorder and anxiety who presents to [facility] via virtual telephone visit for follow-up and medication management.  Patient reports no issues or concerns regarding his current medication regimen.  Patient denies the need for dosage adjustments at this time and is requesting refills on all his medications following the conclusion of the encounter.  1. Mild episode of recurrent major depressive disorder (HCC)  - DULoxetine (CYMBALTA) 30 MG capsule; Take 2 capsules (60 mg total) by  mouth 2 (two) times daily.  Dispense: 120 capsule; Refill: 2  2. Anxiety state  - DULoxetine (CYMBALTA) 30 MG capsule; Take 2 capsules (60 mg total) by mouth 2 (two) times daily.  Dispense: 120 capsule; Refill: 2  Patient to follow up in 3 months Provider spent a total of 11 minutes with the patient/reviewing the patient's chart  [facility], PA 04/14/2021, 3:37 AM

## 2021-06-13 ENCOUNTER — Encounter (HOSPITAL_COMMUNITY): Payer: Self-pay | Admitting: Physician Assistant

## 2021-07-13 ENCOUNTER — Telehealth (INDEPENDENT_AMBULATORY_CARE_PROVIDER_SITE_OTHER): Payer: No Payment, Other | Admitting: Physician Assistant

## 2021-07-13 ENCOUNTER — Encounter (HOSPITAL_COMMUNITY): Payer: Self-pay | Admitting: Physician Assistant

## 2021-07-13 DIAGNOSIS — F411 Generalized anxiety disorder: Secondary | ICD-10-CM

## 2021-07-13 DIAGNOSIS — F330 Major depressive disorder, recurrent, mild: Secondary | ICD-10-CM

## 2021-07-13 MED ORDER — DULOXETINE HCL 30 MG PO CPEP: 60.0000 mg | ORAL_CAPSULE | Freq: Two times a day (BID) | ORAL | 2 refills | Status: DC

## 2021-07-13 NOTE — Progress Notes (Addendum)
BH MD/PA/NP OP Progress Note  Virtual Visit via Telephone Note  I connected with [name] on 07/13/21 at  1:00 PM EST by telephone and verified that I am speaking with the correct person using two identifiers.  Location: Patient: Home Provider: Clinic   I discussed the limitations, risks, security and privacy concerns of performing an evaluation and management service by telephone and the availability of in person appointments. I also discussed with the patient that there may be a patient responsible charge related to this service. The patient expressed understanding and agreed to proceed.  Follow Up Instructions:  I discussed the assessment and treatment plan with the patient. The patient was provided an opportunity to ask questions and all were answered. The patient agreed with the plan and demonstrated an understanding of the instructions.   The patient was advised to call back or seek an in-person evaluation if the symptoms worsen or if the condition fails to improve as anticipated.  I provided 13 minutes of non-face-to-face time during this encounter.  Meta Hatchet, PA    07/13/2021 6:35 PM [name]  MRN:  [id number]  Chief Complaint: Follow up and medication management  HPI:   Jailyn Langhorst. Lucretia Roers is a 45 year old male with a past psychiatric history significant for major depressive disorder and anxiety who presents to [facility] via virtual telephone visit for follow-up and medication management.  Patient is currently being managed on the following medication: Duloxetine 30 mg 2 capsules by mouth 2 times daily.  Patient reports no issues or concerns regarding his current medication regimen.  Patient denies a need for dosage adjustments at this time and is requesting refills on his medication following the conclusion of the encounter.  Patient reports that his mood has been well, however, he reports that a couple of  months ago he lost his niece to foul play.  He reports that he has been managing well but he has been trying to be there for his brother and his family during this difficult time.  Since the loss of his niece, patient states that his anxiety has been mildly elevated.  Patient rates his anxiety a 4 out of 10 but denies any major stressors at this time.  A GAD-7 screen was performed with the patient scoring a 4.  Patient is alert and oriented x4, calm, cooperative, and fully engaged in conversation during the encounter.  Patient endorses good mood.  Patient denies suicidal ideations.  He does endorse homicidal ideations towards the individual that killed his niece but denies having a plan in place.  Patient denies auditory or visual hallucinations and does not appear to be responding to internal/external stimuli.  Patient endorses good sleep and receives on average 7 to 8 hours of sleep each night.  Patient endorses good appetite and eats on average 2 meals per day.  Patient endorses alcohol consumption and states that he drinks during the weekends on occasion.  Patient endorses tobacco use and smokes on average a pack per day.  Patient denies illicit drug use.  Visit Diagnosis:    ICD-10-CM   1. Mild episode of recurrent major depressive disorder (HCC)  F33.0 DULoxetine (CYMBALTA) 30 MG capsule    2. Anxiety state  F41.1 DULoxetine (CYMBALTA) 30 MG capsule      Past Psychiatric History:  Anxiety Depression - patient states that he used to be manic depressive  Past Medical History:  Past Medical History:  Diagnosis Date  Alcohol abuse    Bipolar disorder (HCC)    sees [facility]    Diabetes mellitus without complication (HCC)    Headache(784.0)    Hypertension    Obesity 10/24/2012    Past Surgical History:  Procedure Laterality Date   LAPAROSCOPIC APPENDECTOMY N/A 07/31/2020   Procedure: APPENDECTOMY LAPAROSCOPIC;  Surgeon: Manus Rudd, MD;  Location: [facility] OR;  Service:  General;  Laterality: N/A;    Family Psychiatric History:  None  Family History:  Family History  Problem Relation Age of Onset   Hypertension Other    Cancer Other        lung   Depression Other     Social History:  Social History   Socioeconomic History   Marital status: Single    Spouse name: Not on file   Number of children: Not on file   Years of education: Not on file   Highest education level: Not on file  Occupational History   Not on file  Tobacco Use   Smoking status: Every Day    Packs/day: 1.00    Types: Cigarettes   Smokeless tobacco: Never  Substance and Sexual Activity   Alcohol use: Yes    Alcohol/week: 0.0 standard drinks    Comment: occ   Drug use: No   Sexual activity: Not on file  Other Topics Concern   Not on file  Social History Narrative   Not on file   Social Determinants of Health   Financial Resource Strain: Not on file  Food Insecurity: Not on file  Transportation Needs: Not on file  Physical Activity: Not on file  Stress: Not on file  Social Connections: Not on file    Allergies:  Allergies  Allergen Reactions   Metformin And Related Other (See Comments)    Abdominal cramps     Metabolic Disorder Labs: Lab Results  Component Value Date   HGBA1C 8.9 (H) 03/15/2021   MPG 197 (H) 10/03/2011   No results found for: PROLACTIN Lab Results  Component Value Date   CHOL 216 (H) 03/15/2021   TRIG 246.0 (H) 03/15/2021   HDL 37.40 (L) 03/15/2021   CHOLHDL 6 03/15/2021   VLDL 49.2 (H) 03/15/2021   LDLCALC 145 (H) 02/15/2015   LDLCALC 161 (H) 01/13/2014   Lab Results  Component Value Date   TSH 1.64 03/15/2021   TSH 1.95 01/27/2020    Therapeutic Level Labs: No results found for: LITHIUM No results found for: VALPROATE No components found for:  CBMZ  Current Medications: Current Outpatient Medications  Medication Sig Dispense Refill   acetaminophen (TYLENOL) 500 MG tablet Take 1-2 tablets (500-1,000 mg total) by  mouth every 6 (six) hours as needed for mild pain. 30 tablet 0   ALPRAZolam (XANAX) 0.5 MG tablet Take 1 tablet (0.5 mg total) by mouth 2 (two) times daily as needed for anxiety. (Patient taking differently: Take 0.5 mg by mouth 2 (two) times daily as needed for anxiety (panic attack).) 60 tablet 0   atorvastatin (LIPITOR) 20 MG tablet Take 1 tablet (20 mg total) by mouth daily. 90 tablet 3   DULoxetine (CYMBALTA) 30 MG capsule Take 2 capsules (60 mg total) by mouth 2 (two) times daily. 120 capsule 2   fish oil-omega-3 fatty acids 1000 MG capsule Take 2 g by mouth daily.     glyBURIDE micronized (GLYNASE) 1.5 MG tablet Take 1 tablet (1.5 mg total) by mouth daily. 90 tablet 3   lisinopril (ZESTRIL) 20 MG tablet  Take 1 tablet (20 mg total) by mouth daily. 90 tablet 3   ondansetron (ZOFRAN) 4 MG tablet Take 1 tablet (4 mg total) by mouth every 6 (six) hours as needed for nausea or vomiting. 15 tablet 1   oxyCODONE (OXY IR/ROXICODONE) 5 MG immediate release tablet Take 1 tablet (5 mg total) by mouth every 6 (six) hours as needed for severe pain. 15 tablet 0   No current facility-administered medications for this visit.     Musculoskeletal: Strength & Muscle Tone: Unable to assess due to telemedicine visit Gait & Station: Unable to assess due to telemedicine visit Patient leans: Unable to assess due to telemedicine visit  Psychiatric Specialty Exam: Review of Systems  Psychiatric/Behavioral:  Negative for decreased concentration, dysphoric mood, hallucinations, self-injury, sleep disturbance and suicidal ideas. The patient is not nervous/anxious and is not hyperactive.    There were no vitals taken for this visit.There is no height or weight on file to calculate BMI.  General Appearance: Unable to assess due to telemedicine visit  Eye Contact:  Unable to assess due to telemedicine visit  Speech:  Clear and Coherent and Normal Rate  Volume:  Normal  Mood:  Euthymic  Affect:  Appropriate   Thought Process:  Coherent and Descriptions of Associations: Intact  Orientation:  Full (Time, Place, and Person)  Thought Content: WDL   Suicidal Thoughts:  No  Homicidal Thoughts:  No  Memory:  Immediate;   Good Recent;   Good Remote;   Good  Judgement:  Good  Insight:  Good  Psychomotor Activity:  Normal  Concentration:  Concentration: Good and Attention Span: Good  Recall:  Good  Fund of Knowledge: Good  Language: Good  Akathisia:  NA  Handed:  Right  AIMS (if indicated): not done  Assets:  Communication Skills Desire for Improvement Social Support Vocational/Educational  ADL's:  Intact  Cognition: WNL  Sleep:  Good   Screenings: AUDIT    Flowsheet Row [facility]  Alcohol Use Disorder Identification Test Final Score (AUDIT) 16      GAD-7    Flowsheet Row Video Visit from 07/13/2021 in [facility] Video Visit from 04/13/2021 in [facility] Video Visit from 01/12/2021 in [facility] Video Visit from 10/13/2020 in [facility] Video Visit from 08/11/2020 in [facility]  Total GAD-7 Score 4 3 2 2 3          Flowsheet Row Video Visit from 07/13/2021 in [facility] Video Visit from 04/13/2021 in [facility] Video Visit from 01/12/2021 in [facility] Video Visit from 10/13/2020 in [facility] Video Visit from 08/11/2020 in [facility]  PHQ-2 Total Score 0 1 0 0 0      Flowsheet Row Video Visit from 07/13/2021 in [facility] Video Visit from 04/13/2021 in [facility] Video Visit from 01/12/2021 in [facility]  C-SSRS RISK  CATEGORY No Risk No Risk No Risk        Assessment and Plan:   Joson Sapp. Richrd Sox is a 45 year old male with a past psychiatric history significant for major depressive disorder and anxiety who presents to [facility] via virtual telephone visit for follow-up and medication management.  Patient reports that he  had recently lost his niece a few months back.  Since losing his knees, patient states that he has been providing support to his brother and his brother's family.  Patient endorses some mild anxiety since losing his niece but states that he has not been well overall.  Patient denies the need for dosage adjustments at this time and is requesting refills on this medication following the conclusion of that encounter.  Patient's medications to be e-prescribed to pharmacy of choice.  1. Mild episode of recurrent major depressive disorder (HCC)  - DULoxetine (CYMBALTA) 30 MG capsule; Take 2 capsules (60 mg total) by mouth 2 (two) times daily.  Dispense: 120 capsule; Refill: 2  2. Anxiety state  - DULoxetine (CYMBALTA) 30 MG capsule; Take 2 capsules (60 mg total) by mouth 2 (two) times daily.  Dispense: 120 capsule; Refill: 2  Patient to follow up in 3 months Provider spent a total of 13 minutes with the patient/reviewing patient's chart  Meta Hatchet, PA 07/13/2021, 6:35 PM

## 2021-08-10 ENCOUNTER — Other Ambulatory Visit (HOSPITAL_COMMUNITY): Payer: Self-pay | Admitting: *Deleted

## 2021-08-10 ENCOUNTER — Telehealth (HOSPITAL_COMMUNITY): Payer: Self-pay | Admitting: *Deleted

## 2021-08-10 DIAGNOSIS — F330 Major depressive disorder, recurrent, mild: Secondary | ICD-10-CM

## 2021-08-10 DIAGNOSIS — F411 Generalized anxiety disorder: Secondary | ICD-10-CM

## 2021-08-10 MED ORDER — DULOXETINE HCL 30 MG PO CPEP: 60.0000 mg | ORAL_CAPSULE | Freq: Two times a day (BID) | ORAL | 2 refills | Status: DC

## 2021-08-10 NOTE — Telephone Encounter (Signed)
Duloxetine called into [facility] in [place] initially, his pharmacy is [facility] in [place] at this time, changed pharmacy at his request to the [facility].

## 2021-08-13 ENCOUNTER — Telehealth (HOSPITAL_COMMUNITY): Payer: Self-pay | Admitting: *Deleted

## 2021-08-13 NOTE — Telephone Encounter (Signed)
Rx [facility] Faxed Refill Request   DULoxetine (CYMBALTA) 30 MG capsule Take 2 capsules (60 mg total) by mouth  2 (two) times daily., Starting Fri 08/10/2021

## 2021-08-14 NOTE — Telephone Encounter (Signed)
Provider was contacted by Diareze Netty Starring, RMA for medication fill.  Patient's medication was refilled on 08/10/2021.

## 2021-10-12 ENCOUNTER — Telehealth (INDEPENDENT_AMBULATORY_CARE_PROVIDER_SITE_OTHER): Payer: No Payment, Other | Admitting: Physician Assistant

## 2021-10-12 ENCOUNTER — Encounter (HOSPITAL_COMMUNITY): Payer: Self-pay | Admitting: Physician Assistant

## 2021-10-12 DIAGNOSIS — F330 Major depressive disorder, recurrent, mild: Secondary | ICD-10-CM

## 2021-10-12 DIAGNOSIS — F411 Generalized anxiety disorder: Secondary | ICD-10-CM

## 2021-10-12 MED ORDER — DULOXETINE HCL 30 MG PO CPEP: 60.0000 mg | ORAL_CAPSULE | Freq: Two times a day (BID) | ORAL | 2 refills | Status: DC

## 2021-10-12 NOTE — Progress Notes (Signed)
BH MD/PA/NP OP Progress Note  Virtual Visit via Telephone Note  I connected with [name] [name] on 10/12/21 at  1:00 PM EST by telephone and verified that I am speaking with the correct person using two identifiers.  Location: Patient: Home Provider: Clinic   I discussed the limitations, risks, security and privacy concerns of performing an evaluation and management service by telephone and the availability of in person appointments. I also discussed with the patient that there may be a patient responsible charge related to this service. The patient expressed understanding and agreed to proceed.  Follow Up Instructions:  I discussed the assessment and treatment plan with the patient. The patient was provided an opportunity to ask questions and all were answered. The patient agreed with the plan and demonstrated an understanding of the instructions.   The patient was advised to call back or seek an in-person evaluation if the symptoms worsen or if the condition fails to improve as anticipated.  I provided 8 minutes of non-face-to-face time during this encounter.  Meta Hatchet, PA    10/12/2021 9:56 PM [name] [name]  MRN:  [id number]  Chief Complaint:  Chief Complaint  Patient presents with   Follow-up   HPI:   [name] [name] is a 46 year old male with a past psychiatric history significant for major depressive disorder and anxiety who presents to [facility] via virtual telephone visit for follow-up and medication management.  Patient is currently being managed on the following medication: Duloxetine 30 mg 2 capsules by mouth 2 times daily.  Patient reports no issues or concerns regarding his current medication regimen.  Patient denies the need for dosage adjustments at this time and is requesting refills on his medications following the conclusion of the encounter.  Patient appears to be stable on his medication.  Patient denies  depressive symptoms or anxiety.  Other than currently experiencing a mild sinus infection, patient denies any new stressors at this time.  Patient is alert and oriented x4, calm, cooperative, and fully engaged in conversation during the encounter.  Patient endorses good mood.  Patient denies suicidal or homicidal ideations.  He further denies auditory or visual hallucinations and does not appear to be responding to internal/external stimuli.  Patient endorses good sleep and receives on average 6 to 7 hours of sleep each night.  Patient endorses good appetite and eats on average 2 meals per day.  Patient endorses all consumption occasionally.  Patient endorses tobacco use and smokes on average a pack per day.  Patient denies illicit drug use.  Visit Diagnosis:    ICD-10-CM   1. Mild episode of recurrent major depressive disorder (HCC)  F33.0 DULoxetine (CYMBALTA) 30 MG capsule    2. Anxiety state  F41.1 DULoxetine (CYMBALTA) 30 MG capsule      Past Psychiatric History:  Anxiety Depression - patient states that he used to be manic depressive  Past Medical History:  Past Medical History:  Diagnosis Date   Alcohol abuse    Bipolar disorder (HCC)    sees [facility]    Diabetes mellitus without complication (HCC)    Headache(784.0)    Hypertension    Obesity 10/24/2012    Past Surgical History:  Procedure Laterality Date   LAPAROSCOPIC APPENDECTOMY N/A 07/31/2020   Procedure: APPENDECTOMY LAPAROSCOPIC;  Surgeon: Manus Rudd, MD;  Location: [facility] OR;  Service: General;  Laterality: N/A;    Family Psychiatric History:  None  Family History:  Family  History  Problem Relation Age of Onset   Hypertension Other    Cancer Other        lung   Depression Other     Social History:  Social History   Socioeconomic History   Marital status: Single    Spouse name: Not on file   Number of children: Not on file   Years of education: Not on file   Highest education level: Not  on file  Occupational History   Not on file  Tobacco Use   Smoking status: Every Day    Packs/day: 1.00    Types: Cigarettes   Smokeless tobacco: Never  Substance and Sexual Activity   Alcohol use: Yes    Alcohol/week: 0.0 standard drinks    Comment: occ   Drug use: No   Sexual activity: Not on file  Other Topics Concern   Not on file  Social History Narrative   Not on file   Social Determinants of Health   Financial Resource Strain: Not on file  Food Insecurity: Not on file  Transportation Needs: Not on file  Physical Activity: Not on file  Stress: Not on file  Social Connections: Not on file    Allergies:  Allergies  Allergen Reactions   Metformin And Related Other (See Comments)    Abdominal cramps     Metabolic Disorder Labs: Lab Results  Component Value Date   HGBA1C 8.9 (H) 03/15/2021   MPG 197 (H) 10/03/2011   No results found for: PROLACTIN Lab Results  Component Value Date   CHOL 216 (H) 03/15/2021   TRIG 246.0 (H) 03/15/2021   HDL 37.40 (L) 03/15/2021   CHOLHDL 6 03/15/2021   VLDL 49.2 (H) 03/15/2021   LDLCALC 145 (H) 02/15/2015   LDLCALC 161 (H) 01/13/2014   Lab Results  Component Value Date   TSH 1.64 03/15/2021   TSH 1.95 01/27/2020    Therapeutic Level Labs: No results found for: LITHIUM No results found for: VALPROATE No components found for:  CBMZ  Current Medications: Current Outpatient Medications  Medication Sig Dispense Refill   acetaminophen (TYLENOL) 500 MG tablet Take 1-2 tablets (500-1,000 mg total) by mouth every 6 (six) hours as needed for mild pain. 30 tablet 0   ALPRAZolam (XANAX) 0.5 MG tablet Take 1 tablet (0.5 mg total) by mouth 2 (two) times daily as needed for anxiety. (Patient taking differently: Take 0.5 mg by mouth 2 (two) times daily as needed for anxiety (panic attack).) 60 tablet 0   atorvastatin (LIPITOR) 20 MG tablet Take 1 tablet (20 mg total) by mouth daily. 90 tablet 3   DULoxetine (CYMBALTA) 30 MG  capsule Take 2 capsules (60 mg total) by mouth 2 (two) times daily. 120 capsule 2   fish oil-omega-3 fatty acids 1000 MG capsule Take 2 g by mouth daily.     glyBURIDE micronized (GLYNASE) 1.5 MG tablet Take 1 tablet (1.5 mg total) by mouth daily. 90 tablet 3   lisinopril (ZESTRIL) 20 MG tablet Take 1 tablet (20 mg total) by mouth daily. 90 tablet 3   ondansetron (ZOFRAN) 4 MG tablet Take 1 tablet (4 mg total) by mouth every 6 (six) hours as needed for nausea or vomiting. 15 tablet 1   oxyCODONE (OXY IR/ROXICODONE) 5 MG immediate release tablet Take 1 tablet (5 mg total) by mouth every 6 (six) hours as needed for severe pain. 15 tablet 0   No current facility-administered medications for this visit.     Musculoskeletal: Strength &  Muscle Tone: Unable to assess due to telemedicine visit Gait & Station: Unable to assess due to telemedicine visit Patient leans: Unable to assess due to telemedicine visit  Psychiatric Specialty Exam: Review of Systems  Psychiatric/Behavioral:  Negative for decreased concentration, dysphoric mood, hallucinations, self-injury, sleep disturbance and suicidal ideas. The patient is not nervous/anxious and is not hyperactive.    There were no vitals taken for this visit.There is no height or weight on file to calculate BMI.  General Appearance: Unable to assess due to telemedicine visit  Eye Contact:  Unable to assess due to telemedicine visit  Speech:  Clear and Coherent and Normal Rate  Volume:  Normal  Mood:  Euthymic  Affect:  Appropriate  Thought Process:  Coherent, Goal Directed, and Descriptions of Associations: Intact  Orientation:  Full (Time, Place, and Person)  Thought Content: WDL   Suicidal Thoughts:  No  Homicidal Thoughts:  No  Memory:  Immediate;   Good Recent;   Good Remote;   Good  Judgement:  Good  Insight:  Good  Psychomotor Activity:  Normal  Concentration:  Concentration: Good and Attention Span: Good  Recall:  Good  Fund of  Knowledge: Good  Language: Good  Akathisia:  Yes  Handed:  Right  AIMS (if indicated): not done  Assets:  Communication Skills Desire for Improvement Social Support Vocational/Educational  ADL's:  Intact  Cognition: WNL  Sleep:  Good   Screenings: AUDIT    Flowsheet Row [facility]  Alcohol Use Disorder Identification Test Final Score (AUDIT) 16      GAD-7    Flowsheet Row Video Visit from 10/12/2021 in [facility] Video Visit from 07/13/2021 in [facility] Video Visit from 04/13/2021 in [facility] Video Visit from 01/12/2021 in [facility] Video Visit from 10/13/2020 in [facility]  Total GAD-7 Score 2 4 3 2 2       PHQ2-9    Flowsheet Row Video Visit from 10/12/2021 in [facility] Video Visit from 07/13/2021 in [facility] Video Visit from 04/13/2021 in [facility] Video Visit from 01/12/2021 in [facility] Video Visit from 10/13/2020 in [facility]  PHQ-2 Total Score 0 0 1 0 0      Flowsheet Row Video Visit from 10/12/2021 in [facility] Video Visit from 07/13/2021 in [facility] Video Visit from 04/13/2021 in [facility]  C-SSRS RISK CATEGORY No Risk No Risk No Risk        Assessment and Plan:   [name] [name] is a 46 year old male with a past psychiatric history significant for major depressive disorder and anxiety who presents to [facility] via virtual telephone visit for follow-up and medication management.  Patient reports no issues or concerns regarding his current medication regimen.   Patient appears to be stable at this time.  Patient's medication to be e-prescribed to pharmacy of choice.  Collaboration of Care: Collaboration of Care: Medication Management AEB patient being managed by this provider and Psychiatrist AEB patient being managed by this mental health provider.  Patient/Guardian was advised Release of Information must be obtained prior to any record release in order to collaborate their care with an outside provider. Patient/Guardian was advised if they have  not already done so to contact the registration department to sign all necessary forms in order for [place] to release information regarding their care.   Consent: Patient/Guardian gives verbal consent for treatment and assignment of benefits for services provided during this visit. Patient/Guardian expressed understanding and agreed to proceed.   1. Mild episode of recurrent major depressive disorder (HCC)  - DULoxetine (CYMBALTA) 30 MG capsule; Take 2 capsules (60 mg total) by mouth 2 (two) times daily.  Dispense: 120 capsule; Refill: 2  2. Anxiety state  - DULoxetine (CYMBALTA) 30 MG capsule; Take 2 capsules (60 mg total) by mouth 2 (two) times daily.  Dispense: 120 capsule; Refill: 2  Patient to follow up in 3 months Provider spent a total of  8 minutes with the patient/reviewing the patient's chart  Meta Hatchet, PA 10/12/2021, 9:56 PM

## 2021-11-12 ENCOUNTER — Other Ambulatory Visit (HOSPITAL_COMMUNITY): Payer: Self-pay | Admitting: Physician Assistant

## 2021-11-12 ENCOUNTER — Telehealth (HOSPITAL_COMMUNITY): Payer: Self-pay | Admitting: *Deleted

## 2021-11-12 DIAGNOSIS — F411 Generalized anxiety disorder: Secondary | ICD-10-CM

## 2021-11-12 DIAGNOSIS — F330 Major depressive disorder, recurrent, mild: Secondary | ICD-10-CM

## 2021-11-12 MED ORDER — DULOXETINE HCL 30 MG PO CPEP: 60.0000 mg | ORAL_CAPSULE | Freq: Two times a day (BID) | ORAL | 2 refills | Status: DC

## 2021-11-12 NOTE — Telephone Encounter (Signed)
Refill Request Faxed for  : DULoxetine (CYMBALTA) 30 MG capsule ?

## 2021-11-12 NOTE — Progress Notes (Signed)
Provider was contacted by Direce E. McIntyre, RMA regarding medication refills for this patient. Patient's medication to be e-prescribed to pharmacy of choice.

## 2021-11-12 NOTE — Telephone Encounter (Signed)
Provider was contacted by Rushie Chestnut, RMA regarding medication refills for this patient. Patient's medication to be e-prescribed to pharmacy of choice.

## 2021-11-26 ENCOUNTER — Telehealth (HOSPITAL_COMMUNITY): Payer: Self-pay

## 2021-11-26 NOTE — Telephone Encounter (Signed)
Writer contacted patient regarding county of residence. Chart notes that patient is indigent in [place], which is not in [place] catchment area. Writer left confidential voicemail requesting patient to return call regarding following up for outpatient services at [phone].  ?

## 2021-12-19 ENCOUNTER — Telehealth (HOSPITAL_COMMUNITY): Payer: Self-pay

## 2021-12-19 NOTE — Telephone Encounter (Signed)
This patient contacted the office to follow-up on transition due to residency. Patient states that he still resides in [facility], but he spends a lot of days at his parents house in [place] due to his father being severely ill. Writer informed that proof of residence will allow him to continue receiving services with [place], otherwise he'd need to go to [facility] or [facility] for [employer].  ?

## 2022-01-05 ENCOUNTER — Other Ambulatory Visit: Payer: Self-pay | Admitting: Family Medicine

## 2022-01-05 DIAGNOSIS — F411 Generalized anxiety disorder: Secondary | ICD-10-CM

## 2022-01-05 DIAGNOSIS — F330 Major depressive disorder, recurrent, mild: Secondary | ICD-10-CM

## 2022-01-11 ENCOUNTER — Telehealth (INDEPENDENT_AMBULATORY_CARE_PROVIDER_SITE_OTHER): Payer: No Payment, Other | Admitting: Physician Assistant

## 2022-01-11 ENCOUNTER — Encounter (HOSPITAL_COMMUNITY): Payer: Self-pay | Admitting: Physician Assistant

## 2022-01-11 DIAGNOSIS — F411 Generalized anxiety disorder: Secondary | ICD-10-CM

## 2022-01-11 DIAGNOSIS — F330 Major depressive disorder, recurrent, mild: Secondary | ICD-10-CM | POA: Diagnosis not present

## 2022-01-11 MED ORDER — DULOXETINE HCL 30 MG PO CPEP: 60.0000 mg | ORAL_CAPSULE | Freq: Two times a day (BID) | ORAL | 3 refills | Status: DC

## 2022-01-11 NOTE — Progress Notes (Addendum)
BH MD/PA/NP OP Progress Note  Virtual Visit via Telephone Note  I connected with [name] Nones on 01/11/22 at  1:00 PM EDT by telephone and verified that I am speaking with the correct person using two identifiers.  Location: Patient: Home Provider: Clinic   I discussed the limitations, risks, security and privacy concerns of performing an evaluation and management service by telephone and the availability of in person appointments. I also discussed with the patient that there may be a patient responsible charge related to this service. The patient expressed understanding and agreed to proceed.  Follow Up Instructions:  I discussed the assessment and treatment plan with the patient. The patient was provided an opportunity to ask questions and all were answered. The patient agreed with the plan and demonstrated an understanding of the instructions.   The patient was advised to call back or seek an in-person evaluation if the symptoms worsen or if the condition fails to improve as anticipated.  I provided 9 minutes of non-face-to-face time during this encounter.  Meta Hatchet, PA    01/11/2022 10:24 PM [name] Nones  MRN:  [id number]  Chief Complaint:  Chief Complaint  Patient presents with   Follow-up   Medication Refill   HPI:   [name]. Gramm is a 46 year old male with a past psychiatric history significant for major depressive disorder and anxiety who presents to [facility] via virtual telephone visit for follow-up and medication management.  Patient is currently being managed on the following medication: Duloxetine 30 mg 2 capsules by mouth 2 times daily.  Patient reports no issues or concerns regarding his current medication regimen.  Patient reports that he has been on his duloxetine for years and continues to do okay while taking the medication.  Patient denies depressive symptoms but does endorse anxiety on occasion.   Patient rates his anxiety as 4 out of 10 and states that it is currently manageable at this time.  Patient denies any new stressors at this time and reports that things are continuing to go pretty well for him.  A GAD-7 screen was performed with the patient scoring an 8.  Patient is alert and oriented x4, calm, cooperative, and fully engaged in conversation during the encounter.  Patient endorses good mood.  Patient denies suicidal or homicidal ideations.  He further denies auditory or visual hallucinations and does not appear to be responding to internal/external stimuli.  Patient endorses good sleep and receives on average 7 hours of sleep each night.  Patient endorses good appetite and eats on average 2 meals per day.  Patient endorses alcohol consumption sparingly.  Patient endorses tobacco use and smokes on average a pack per day.  Patient denies illicit drug use.  Visit Diagnosis:    ICD-10-CM   1. Mild episode of recurrent major depressive disorder (HCC)  F33.0 DULoxetine (CYMBALTA) 30 MG capsule    2. Anxiety state  F41.1 DULoxetine (CYMBALTA) 30 MG capsule      Past Psychiatric History:  Anxiety Depression - patient states that he used to be manic depressive  Past Medical History:  Past Medical History:  Diagnosis Date   Alcohol abuse    Bipolar disorder (HCC)    sees [facility]    Diabetes mellitus without complication (HCC)    Headache(784.0)    Hypertension    Obesity 10/24/2012    Past Surgical History:  Procedure Laterality Date   LAPAROSCOPIC APPENDECTOMY N/A 07/31/2020   Procedure:  APPENDECTOMY LAPAROSCOPIC;  Surgeon: Manus Rudd, MD;  Location: [facility] OR;  Service: General;  Laterality: N/A;    Family Psychiatric History:  None  Family History:  Family History  Problem Relation Age of Onset   Hypertension Other    Cancer Other        lung   Depression Other     Social History:  Social History   Socioeconomic History   Marital status: Single     Spouse name: Not on file   Number of children: Not on file   Years of education: Not on file   Highest education level: Not on file  Occupational History   Not on file  Tobacco Use   Smoking status: Every Day    Packs/day: 1.00    Types: Cigarettes   Smokeless tobacco: Never  Substance and Sexual Activity   Alcohol use: Yes    Alcohol/week: 0.0 standard drinks    Comment: occ   Drug use: No   Sexual activity: Not on file  Other Topics Concern   Not on file  Social History Narrative   Not on file   Social Determinants of Health   Financial Resource Strain: Not on file  Food Insecurity: Not on file  Transportation Needs: Not on file  Physical Activity: Not on file  Stress: Not on file  Social Connections: Not on file    Allergies:  Allergies  Allergen Reactions   Metformin And Related Other (See Comments)    Abdominal cramps     Metabolic Disorder Labs: Lab Results  Component Value Date   HGBA1C 8.9 (H) 03/15/2021   MPG 197 (H) 10/03/2011   No results found for: PROLACTIN Lab Results  Component Value Date   CHOL 216 (H) 03/15/2021   TRIG 246.0 (H) 03/15/2021   HDL 37.40 (L) 03/15/2021   CHOLHDL 6 03/15/2021   VLDL 49.2 (H) 03/15/2021   LDLCALC 145 (H) 02/15/2015   LDLCALC 161 (H) 01/13/2014   Lab Results  Component Value Date   TSH 1.64 03/15/2021   TSH 1.95 01/27/2020    Therapeutic Level Labs: No results found for: LITHIUM No results found for: VALPROATE No components found for:  CBMZ  Current Medications: Current Outpatient Medications  Medication Sig Dispense Refill   acetaminophen (TYLENOL) 500 MG tablet Take 1-2 tablets (500-1,000 mg total) by mouth every 6 (six) hours as needed for mild pain. 30 tablet 0   ALPRAZolam (XANAX) 0.5 MG tablet Take 1 tablet (0.5 mg total) by mouth 2 (two) times daily as needed for anxiety. (Patient taking differently: Take 0.5 mg by mouth 2 (two) times daily as needed for anxiety (panic attack).) 60 tablet 0    atorvastatin (LIPITOR) 20 MG tablet Take 1 tablet (20 mg total) by mouth daily. 90 tablet 3   DULoxetine (CYMBALTA) 30 MG capsule Take 2 capsules (60 mg total) by mouth 2 (two) times daily. 120 capsule 3   fish oil-omega-3 fatty acids 1000 MG capsule Take 2 g by mouth daily.     glyBURIDE micronized (GLYNASE) 1.5 MG tablet Take 1 tablet (1.5 mg total) by mouth daily. 90 tablet 3   lisinopril (ZESTRIL) 20 MG tablet Take 1 tablet (20 mg total) by mouth daily. 90 tablet 3   ondansetron (ZOFRAN) 4 MG tablet Take 1 tablet (4 mg total) by mouth every 6 (six) hours as needed for nausea or vomiting. 15 tablet 1   oxyCODONE (OXY IR/ROXICODONE) 5 MG immediate release tablet Take 1 tablet (5 mg  total) by mouth every 6 (six) hours as needed for severe pain. 15 tablet 0   No current facility-administered medications for this visit.     Musculoskeletal: Strength & Muscle Tone: Unable to assess due to telemedicine visit Gait & Station: Unable to assess due to telemedicine visit Patient leans: Unable to assess due to telemedicine visit  Psychiatric Specialty Exam: Review of Systems  Psychiatric/Behavioral:  Negative for decreased concentration, dysphoric mood, hallucinations, self-injury, sleep disturbance and suicidal ideas. The patient is not nervous/anxious and is not hyperactive.    There were no vitals taken for this visit.There is no height or weight on file to calculate BMI.  General Appearance: Unable to assess due to telemedicine visit  Eye Contact:  Unable to assess due to telemedicine visit  Speech:  Clear and Coherent and Normal Rate  Volume:  Normal  Mood:  Euthymic  Affect:  Appropriate  Thought Process:  Coherent, Goal Directed, and Descriptions of Associations: Intact  Orientation:  Full (Time, Place, and Person)  Thought Content: WDL   Suicidal Thoughts:  No  Homicidal Thoughts:  No  Memory:  Immediate;   Good Recent;   Good Remote;   Good  Judgement:  Good  Insight:  Good   Psychomotor Activity:  Normal  Concentration:  Concentration: Good and Attention Span: Good  Recall:  Good  Fund of Knowledge: Good  Language: Good  Akathisia:  Yes  Handed:  Right  AIMS (if indicated): not done  Assets:  Communication Skills Desire for Improvement Social Support Vocational/Educational  ADL's:  Intact  Cognition: WNL  Sleep:  Good   Screenings: AUDIT    Flowsheet Row [facility]  Alcohol Use Disorder Identification Test Final Score (AUDIT) 16      GAD-7    Flowsheet Row Video Visit from 01/11/2022 in [facility] Video Visit from 10/12/2021 in [facility] Video Visit from 07/13/2021 in [facility] Video Visit from 04/13/2021 in [facility] Video Visit from 01/12/2021 in [facility]  Total GAD-7 Score 8 2 4 3 2       PHQ2-9    Flowsheet Row Video Visit from 01/11/2022 in [facility] Video Visit from 10/12/2021 in [facility] Video Visit from 07/13/2021 in [facility] Video Visit from 04/13/2021 in [facility] Video Visit from 01/12/2021 in [place]  PHQ-2 Total Score 0 0 0 1 0      Flowsheet Row Video Visit from 01/11/2022 in [facility] Video Visit from 10/12/2021 in [facility] Video Visit from 07/13/2021 in [facility]  C-SSRS RISK CATEGORY No Risk No Risk No Risk        Assessment and Plan:   [name] [name]. [name] [name] is a 46 year old male with a past psychiatric history significant for major depressive disorder and anxiety who presents to [facility] via virtual telephone visit for  follow-up and medication management.  Patient reports no issues or concerns regarding his current medication regimen.  Patient denies depressive symptoms and states that his anxiety is manageable at this time.  Patient appears stable on his current medication regimen and will continue to take his medication as prescribed.  Patient's medication to be prescribed to pharmacy of choice.  Collaboration of  Care: Collaboration of Care: Medication Management AEB patient being managed by this provider and Psychiatrist AEB patient being managed by this mental health provider.  Patient/Guardian was advised Release of Information must be obtained prior to any record release in order to collaborate their care with an outside provider. Patient/Guardian was advised if they have not already done so to contact the registration department to sign all necessary forms in order for us to release information regarding their care.   Consent: Patient/Guardian gives verbal consent for treatment and assignment of benefits for services provided during this visit. Patient/Guardian expressed understanding and agreed to proceed.   1. Mild episode of recurrent major depressive disorder (HCC)  - DULoxetine (CYMBALTA) 30 MG capsule; Take 2 capsules (60 mg total) by mouth 2 (two) times daily.  Dispense: 120 capsule; Refill: 3  2. Anxiety state  - DULoxetine (CYMBALTA) 30 MG capsule; Take 2 capsules (60 mg total) by mouth 2 (two) times daily.  Dispense: 120 capsule; Refill: 3  Patient to follow up in 3 months Provider spent a total of 9 minutes with the patient/reviewing the patient's chart  Meta HatchetUchenna E Lima Chillemi, PA 01/11/2022, 10:24 PM

## 2022-03-06 ENCOUNTER — Other Ambulatory Visit: Payer: Self-pay | Admitting: Family Medicine

## 2022-04-05 ENCOUNTER — Other Ambulatory Visit: Payer: Self-pay | Admitting: Family Medicine

## 2022-04-05 DIAGNOSIS — F330 Major depressive disorder, recurrent, mild: Secondary | ICD-10-CM

## 2022-04-05 DIAGNOSIS — F411 Generalized anxiety disorder: Secondary | ICD-10-CM

## 2022-04-12 ENCOUNTER — Telehealth (INDEPENDENT_AMBULATORY_CARE_PROVIDER_SITE_OTHER): Payer: No Payment, Other | Admitting: Physician Assistant

## 2022-04-12 ENCOUNTER — Encounter (HOSPITAL_COMMUNITY): Payer: Self-pay | Admitting: Physician Assistant

## 2022-04-12 DIAGNOSIS — F330 Major depressive disorder, recurrent, mild: Secondary | ICD-10-CM

## 2022-04-12 DIAGNOSIS — F411 Generalized anxiety disorder: Secondary | ICD-10-CM | POA: Diagnosis not present

## 2022-04-12 MED ORDER — DULOXETINE HCL 30 MG PO CPEP: 60.0000 mg | ORAL_CAPSULE | Freq: Two times a day (BID) | ORAL | 3 refills | Status: DC

## 2022-04-12 NOTE — Progress Notes (Unsigned)
BH MD/PA/NP OP Progress Note  Virtual Visit via Telephone Note  I connected with [name] [name] on 04/12/22 at  1:00 PM EDT by telephone and verified that I am speaking with the correct person using two identifiers.  Location: Patient: Home Provider: Clinic   I discussed the limitations, risks, security and privacy concerns of performing an evaluation and management service by telephone and the availability of in person appointments. I also discussed with the patient that there may be a patient responsible charge related to this service. The patient expressed understanding and agreed to proceed.  Follow Up Instructions:  I discussed the assessment and treatment plan with the patient. The patient was provided an opportunity to ask questions and all were answered. The patient agreed with the plan and demonstrated an understanding of the instructions.   The patient was advised to call back or seek an in-person evaluation if the symptoms worsen or if the condition fails to improve as anticipated.  I provided 6 minutes of non-face-to-face time during this encounter.  Meta Hatchet, PA    04/12/2022 11:51 AM [name] [name]  MRN:  [id number]  Chief Complaint:  Chief Complaint  Patient presents with   Medication Refill   Follow-up   HPI:   [name] [name] is a 46 year old male with a past psychiatric history significant for major depressive disorder and anxiety who presents to [facility] via virtual telephone visit for follow-up and medication management.  Patient is currently being managed on the following medication: Duloxetine 30 mg 2 capsules by mouth 2 times daily.  Patient reports no issues or concerns regarding his current medication regimen.  Patient denies the need for dosage adjustments at this time and is requesting refills on his medication following the conclusion of the encounter.  Patient denies depression but does endorse  mild anxiety.  Patient rates his anxiety at 4 out of 10.  Patient denies any new stressors at this time.  A GAD-7 screen was performed with the patient scoring a 3.  Patient is alert and oriented x4, calm, cooperative, and fully engaged in conversation during the encounter.  Patient endorses good mood.  Patient denies suicidal or homicidal ideations.  He further denies auditory or visual hallucinations and does not appear to be responding to internal/external stimuli.  Patient endorses good sleep and receives on average 6 to 7 hours of sleep each night.  Patient endorses good appetite and eats on average 2 meals a day.  Patient endorses alcohol consumption sparingly.  Patient endorses tobacco use and smokes on average a pack per day.  Patient denies illicit drug use.  Visit Diagnosis:    ICD-10-CM   1. Mild episode of recurrent major depressive disorder (HCC)  F33.0 DULoxetine (CYMBALTA) 30 MG capsule    2. Anxiety state  F41.1 DULoxetine (CYMBALTA) 30 MG capsule      Past Psychiatric History:  Anxiety Depression - patient states that he used to be manic depressive  Past Medical History:  Past Medical History:  Diagnosis Date   Alcohol abuse    Bipolar disorder (HCC)    sees [facility]    Diabetes mellitus without complication (HCC)    Headache(784.0)    Hypertension    Obesity 10/24/2012    Past Surgical History:  Procedure Laterality Date   LAPAROSCOPIC APPENDECTOMY N/A 07/31/2020   Procedure: APPENDECTOMY LAPAROSCOPIC;  Surgeon: Manus Rudd, MD;  Location: [facility] OR;  Service: General;  Laterality: N/A;  Family Psychiatric History:  None  Family History:  Family History  Problem Relation Age of Onset   Hypertension Other    Cancer Other        lung   Depression Other     Social History:  Social History   Socioeconomic History   Marital status: Single    Spouse name: Not on file   Number of children: Not on file   Years of education: Not on file    Highest education level: Not on file  Occupational History   Not on file  Tobacco Use   Smoking status: Every Day    Packs/day: 1.00    Types: Cigarettes   Smokeless tobacco: Never  Substance and Sexual Activity   Alcohol use: Yes    Alcohol/week: 0.0 standard drinks of alcohol    Comment: occ   Drug use: No   Sexual activity: Not on file  Other Topics Concern   Not on file  Social History Narrative   Not on file   Social Determinants of Health   Financial Resource Strain: Not on file  Food Insecurity: Not on file  Transportation Needs: Not on file  Physical Activity: Not on file  Stress: Not on file  Social Connections: Not on file    Allergies:  Allergies  Allergen Reactions   Metformin And Related Other (See Comments)    Abdominal cramps     Metabolic Disorder Labs: Lab Results  Component Value Date   HGBA1C 8.9 (H) 03/15/2021   MPG 197 (H) 10/03/2011   No results found for: "PROLACTIN" Lab Results  Component Value Date   CHOL 216 (H) 03/15/2021   TRIG 246.0 (H) 03/15/2021   HDL 37.40 (L) 03/15/2021   CHOLHDL 6 03/15/2021   VLDL 49.2 (H) 03/15/2021   LDLCALC 145 (H) 02/15/2015   LDLCALC 161 (H) 01/13/2014   Lab Results  Component Value Date   TSH 1.64 03/15/2021   TSH 1.95 01/27/2020    Therapeutic Level Labs: No results found for: "LITHIUM" No results found for: "VALPROATE" No results found for: "CBMZ"  Current Medications: Current Outpatient Medications  Medication Sig Dispense Refill   acetaminophen (TYLENOL) 500 MG tablet Take 1-2 tablets (500-1,000 mg total) by mouth every 6 (six) hours as needed for mild pain. 30 tablet 0   ALPRAZolam (XANAX) 0.5 MG tablet Take 1 tablet (0.5 mg total) by mouth 2 (two) times daily as needed for anxiety. (Patient taking differently: Take 0.5 mg by mouth 2 (two) times daily as needed for anxiety (panic attack).) 60 tablet 0   atorvastatin (LIPITOR) 20 MG tablet Take 1 tablet (20 mg total) by mouth daily.  *Appointment required for future refills* 30 tablet 0   DULoxetine (CYMBALTA) 30 MG capsule Take 2 capsules (60 mg total) by mouth 2 (two) times daily. 120 capsule 3   fish oil-omega-3 fatty acids 1000 MG capsule Take 2 g by mouth daily.     glyBURIDE micronized (GLYNASE) 1.5 MG tablet Take 1 tablet (1.5 mg total) by mouth daily. 90 tablet 3   lisinopril (ZESTRIL) 20 MG tablet TAKE ONE TABLET BY MOUTH ONE TIME DAILY 30 tablet 0   ondansetron (ZOFRAN) 4 MG tablet Take 1 tablet (4 mg total) by mouth every 6 (six) hours as needed for nausea or vomiting. 15 tablet 1   oxyCODONE (OXY IR/ROXICODONE) 5 MG immediate release tablet Take 1 tablet (5 mg total) by mouth every 6 (six) hours as needed for severe pain. 15 tablet 0  No current facility-administered medications for this visit.     Musculoskeletal: Strength & Muscle Tone: Unable to assess due to telemedicine visit Gait & Station: Unable to assess due to telemedicine visit Patient leans: Unable to assess due to telemedicine visit  Psychiatric Specialty Exam: Review of Systems  Psychiatric/Behavioral:  Negative for decreased concentration, dysphoric mood, hallucinations, self-injury, sleep disturbance and suicidal ideas. The patient is not nervous/anxious and is not hyperactive.     There were no vitals taken for this visit.There is no height or weight on file to calculate BMI.  General Appearance: Unable to assess due to telemedicine visit  Eye Contact:  Unable to assess due to telemedicine visit  Speech:  Clear and Coherent and Normal Rate  Volume:  Normal  Mood:  Euthymic  Affect:  Appropriate  Thought Process:  Coherent, Goal Directed, and Descriptions of Associations: Intact  Orientation:  Full (Time, Place, and Person)  Thought Content: WDL   Suicidal Thoughts:  No  Homicidal Thoughts:  No  Memory:  Immediate;   Good Recent;   Good Remote;   Good  Judgement:  Good  Insight:  Good  Psychomotor Activity:  Normal   Concentration:  Concentration: Good and Attention Span: Good  Recall:  Good  Fund of Knowledge: Good  Language: Good  Akathisia:  Yes  Handed:  Right  AIMS (if indicated): not done  Assets:  Communication Skills Desire for Improvement Social Support Vocational/Educational  ADL's:  Intact  Cognition: WNL  Sleep:  Good   Screenings: AUDIT    Flowsheet Row [facility]  Alcohol Use Disorder Identification Test Final Score (AUDIT) 16      GAD-7    Flowsheet Row Video Visit from 04/12/2022 in [facility] Video Visit from 01/11/2022 in [facility] Video Visit from 10/12/2021 in [facility] Video Visit from 07/13/2021 in [facility] Video Visit from 04/13/2021 in [facility]  Total GAD-7 Score 3 8 2 4 3       PHQ2-9    Flowsheet Row Video Visit from 04/12/2022 in [facility] Video Visit from 01/11/2022 in [facility] Video Visit from 10/12/2021 in [facility] Video Visit from 07/13/2021 in [facility] Video Visit from 04/13/2021 in [facility]  PHQ-2 Total Score 0 0 0 0 1      Flowsheet Row Video Visit from 04/12/2022 in [facility] Video Visit from 01/11/2022 in [facility] Video Visit from 10/12/2021 in [facility]  C-SSRS RISK CATEGORY No Risk No Risk No Risk        Assessment and Plan:   [name] [name] is a 46 year old male with a past psychiatric history significant for major depressive disorder and anxiety who presents to [facility] via virtual telephone visit for follow-up and medication  management.  Patient denies the need for dosage adjustments at this time.  Patient denies depressive symptoms but does endorse some mild anxiety that is manageable.  Patient appears stable on his current medication.  Patient's medication to be prescribed to pharmacy of choice.  Collaboration of Care: Collaboration of Care: Medication Management AEB patient being managed by this provider and Psychiatrist AEB patient being managed by this mental health provider.  Patient/Guardian was advised  Release of Information must be obtained prior to any record release in order to collaborate their care with an outside provider. Patient/Guardian was advised if they have not already done so to contact the registration department to sign all necessary forms in order for [place] to release information regarding their care.   Consent: Patient/Guardian gives verbal consent for treatment and assignment of benefits for services provided during this visit. Patient/Guardian expressed understanding and agreed to proceed.   1. Mild episode of recurrent major depressive disorder (HCC)  - DULoxetine (CYMBALTA) 30 MG capsule; Take 2 capsules (60 mg total) by mouth 2 (two) times daily.  Dispense: 120 capsule; Refill: 3  2. Anxiety state  - DULoxetine (CYMBALTA) 30 MG capsule; Take 2 capsules (60 mg total) by mouth 2 (two) times daily.  Dispense: 120 capsule; Refill: 3  Patient to follow up in 3 months Provider spent a total of 6 minutes with the patient/reviewing the patient's chart  Meta Hatchet, PA 04/12/2022, 12:01 PM

## 2022-06-04 ENCOUNTER — Other Ambulatory Visit: Payer: Self-pay | Admitting: Family Medicine

## 2022-06-19 ENCOUNTER — Ambulatory Visit (INDEPENDENT_AMBULATORY_CARE_PROVIDER_SITE_OTHER): Payer: Self-pay | Admitting: Family Medicine

## 2022-06-19 ENCOUNTER — Encounter: Payer: Self-pay | Admitting: Family Medicine

## 2022-06-19 VITALS — BP 132/86 | HR 78 | Ht 69.0 in | Wt 234.0 lb

## 2022-06-19 DIAGNOSIS — Z0000 Encounter for general adult medical examination without abnormal findings: Secondary | ICD-10-CM

## 2022-06-19 LAB — CBC WITH DIFFERENTIAL/PLATELET
Basophils Absolute: 0 10*3/uL (ref 0.0–0.1)
Basophils Relative: 0.4 % (ref 0.0–3.0)
Eosinophils Absolute: 0.2 10*3/uL (ref 0.0–0.7)
Eosinophils Relative: 3.1 % (ref 0.0–5.0)
HCT: 42.4 % (ref 39.0–52.0)
Hemoglobin: 14.4 g/dL (ref 13.0–17.0)
Lymphocytes Relative: 29.3 % (ref 12.0–46.0)
Lymphs Abs: 1.9 10*3/uL (ref 0.7–4.0)
MCHC: 33.9 g/dL (ref 30.0–36.0)
MCV: 99.6 fl (ref 78.0–100.0)
Monocytes Absolute: 0.5 10*3/uL (ref 0.1–1.0)
Monocytes Relative: 7.4 % (ref 3.0–12.0)
Neutro Abs: 3.9 10*3/uL (ref 1.4–7.7)
Neutrophils Relative %: 59.8 % (ref 43.0–77.0)
Platelets: 153 10*3/uL (ref 150.0–400.0)
RBC: 4.26 Mil/uL (ref 4.22–5.81)
RDW: 13 % (ref 11.5–15.5)
WBC: 6.5 10*3/uL (ref 4.0–10.5)

## 2022-06-19 LAB — HEPATIC FUNCTION PANEL
ALT: 27 U/L (ref 0–53)
AST: 27 U/L (ref 0–37)
Albumin: 4.2 g/dL (ref 3.5–5.2)
Alkaline Phosphatase: 77 U/L (ref 39–117)
Bilirubin, Direct: 0.1 mg/dL (ref 0.0–0.3)
Total Bilirubin: 0.6 mg/dL (ref 0.2–1.2)
Total Protein: 7.4 g/dL (ref 6.0–8.3)

## 2022-06-19 LAB — HEMOGLOBIN A1C: Hgb A1c MFr Bld: 8.7 % — ABNORMAL HIGH (ref 4.6–6.5)

## 2022-06-19 LAB — LDL CHOLESTEROL, DIRECT: Direct LDL: 92 mg/dL

## 2022-06-19 LAB — BASIC METABOLIC PANEL
BUN: 15 mg/dL (ref 6–23)
CO2: 26 mEq/L (ref 19–32)
Calcium: 9.4 mg/dL (ref 8.4–10.5)
Chloride: 98 mEq/L (ref 96–112)
Creatinine, Ser: 0.57 mg/dL (ref 0.40–1.50)
GFR: 118.77 mL/min (ref >60.00)
Glucose, Bld: 274 mg/dL — ABNORMAL HIGH (ref 70–99)
Potassium: 4.2 mEq/L (ref 3.5–5.1)
Sodium: 132 mEq/L — ABNORMAL LOW (ref 135–145)

## 2022-06-19 LAB — LIPID PANEL
Cholesterol: 154 mg/dL (ref 0–200)
HDL: 41.8 mg/dL (ref >39.00)
NonHDL: 111.7
Total CHOL/HDL Ratio: 4
Triglycerides: 217 mg/dL — ABNORMAL HIGH (ref 0.0–149.0)
VLDL: 43.4 mg/dL — ABNORMAL HIGH (ref 0.0–40.0)

## 2022-06-19 LAB — TSH: TSH: 1.42 u[IU]/mL (ref 0.35–5.50)

## 2022-06-19 MED ORDER — GLYBURIDE MICRONIZED 1.5 MG PO TABS: 0.7500 mg | ORAL_TABLET | Freq: Two times a day (BID) | ORAL | 3 refills | Status: DC

## 2022-06-19 MED ORDER — PIOGLITAZONE HCL 30 MG PO TABS: 30.0000 mg | ORAL_TABLET | Freq: Every day | ORAL | 3 refills | Status: DC

## 2022-06-19 MED ORDER — LISINOPRIL 20 MG PO TABS: 20.0000 mg | ORAL_TABLET | Freq: Every day | ORAL | 3 refills | Status: DC

## 2022-06-19 MED ORDER — ATORVASTATIN CALCIUM 20 MG PO TABS: 20.0000 mg | ORAL_TABLET | Freq: Every day | ORAL | 3 refills | Status: DC

## 2022-06-19 NOTE — Progress Notes (Signed)
Subjective:    Patient ID: [name] [name], male    DOB: 1976-04-12, 46 y.o.   MRN: [id number]  HPI Here for a well exam. He has been feeling well except for an intermittent sharp pain in the right upper back that started about 5 months ago. No trauma history. He has been seeing a chiropractor for treatments, but they are not really helping. He uses heat and Ibuprofen when it flares up.   Review of Systems  Constitutional: Negative.   HENT: Negative.    Eyes: Negative.   Respiratory: Negative.    Cardiovascular: Negative.   Gastrointestinal: Negative.   Genitourinary: Negative.   Musculoskeletal:  Positive for back pain.  Skin: Negative.   Neurological: Negative.   Psychiatric/Behavioral: Negative.         Objective:   Physical Exam Constitutional:      General: He is not in acute distress.    Appearance: Normal appearance. He is well-developed. He is not diaphoretic.  HENT:     Head: Normocephalic and atraumatic.     Right Ear: External ear normal.     Left Ear: External ear normal.     Nose: Nose normal.     Mouth/Throat:     Pharynx: No oropharyngeal exudate.  Eyes:     General: No scleral icterus.       Right eye: No discharge.        Left eye: No discharge.     Conjunctiva/sclera: Conjunctivae normal.     Pupils: Pupils are equal, round, and reactive to light.  Neck:     Thyroid: No thyromegaly.     Vascular: No JVD.     Trachea: No tracheal deviation.  Cardiovascular:     Rate and Rhythm: Normal rate and regular rhythm.     Heart sounds: Normal heart sounds. No murmur heard.    No friction rub. No gallop.  Pulmonary:     Effort: Pulmonary effort is normal. No respiratory distress.     Breath sounds: Normal breath sounds. No wheezing or rales.  Chest:     Chest wall: No tenderness.  Abdominal:     General: Bowel sounds are normal. There is no distension.     Palpations: Abdomen is soft. There is no mass.     Tenderness: There is no abdominal  tenderness. There is no guarding or rebound.  Genitourinary:    Penis: Normal. No tenderness.      Testes: Normal.     Prostate: Normal.     Rectum: Normal. Guaiac result negative.  Musculoskeletal:        General: Normal range of motion.     Cervical back: Neck supple.     Comments: He is tender in the right upper back between the spine and the scapula   Lymphadenopathy:     Cervical: No cervical adenopathy.  Skin:    General: Skin is warm and dry.     Coloration: Skin is not pale.     Findings: No erythema or rash.  Neurological:     Mental Status: He is alert and oriented to person, place, and time.     Cranial Nerves: No cranial nerve deficit.     Motor: No abnormal muscle tone.     Coordination: Coordination normal.     Deep Tendon Reflexes: Reflexes are normal and symmetric. Reflexes normal.  Psychiatric:        Behavior: Behavior normal.        Thought Content: Thought content  normal.        Judgment: Judgment normal.           Assessment & Plan:  Well exam. We discussed diet and exercise. Get fasting labs. His pain seems to be coming from a pinched nerve. I offered to refer him for PT, but he said he wants to wait until he can get better health insurance.  Gershon Crane, MD

## 2022-07-09 ENCOUNTER — Other Ambulatory Visit (HOSPITAL_COMMUNITY): Payer: Self-pay | Admitting: Psychiatry

## 2022-07-09 ENCOUNTER — Telehealth (HOSPITAL_COMMUNITY): Payer: Self-pay | Admitting: Psychiatry

## 2022-07-09 DIAGNOSIS — F411 Generalized anxiety disorder: Secondary | ICD-10-CM

## 2022-07-09 DIAGNOSIS — F330 Major depressive disorder, recurrent, mild: Secondary | ICD-10-CM

## 2022-07-12 ENCOUNTER — Telehealth (HOSPITAL_COMMUNITY): Payer: Self-pay | Admitting: Physician Assistant

## 2022-07-17 MED ORDER — DULOXETINE HCL 30 MG PO CPEP: 60.0000 mg | ORAL_CAPSULE | Freq: Two times a day (BID) | ORAL | 0 refills | Status: DC

## 2022-07-17 NOTE — Telephone Encounter (Signed)
Received message from front desk that patient is requesting refill of Cymbalta until next appointment with this provider on 07/24/22. Refill sent.  Daine Gip, MD 07/17/22
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
## 2022-07-23 NOTE — Patient Instructions (Signed)
Thank you for attending your appointment today.  -- We did not make any medication changes today. Please continue medications as prescribed.  Please do not make any changes to medications without first discussing with your provider. If you are experiencing a psychiatric emergency, please call 911 or present to your nearest emergency department. Additional crisis, medication management, and therapy resources are included below.  [facility]  [address], [place], [place] [address] [phone] WALK-IN URGENT CARE 24/7 FOR ANYONE [address], , [place]  [phone] Fax: [phone] [email or web address] *Interpreters available *Accepts all insurance and uninsured for Urgent Care needs *Accepts Medicaid and uninsured for outpatient treatment (below)      ONLY FOR [place] Residents  Below:    Outpatient New Patient Assessment/Therapy Walk-ins:        Monday -Thursday 8am until slots are full.        Every Friday 1pm-4pm  (first come, first served)                   New Patient Psychiatry/Medication Management        Monday-Friday 8am-11am (first come, first served)               For all walk-ins we ask that you arrive by 7:15am, because patients will be seen in the order of arrival.   

## 2022-07-24 ENCOUNTER — Telehealth (INDEPENDENT_AMBULATORY_CARE_PROVIDER_SITE_OTHER): Payer: No Payment, Other | Admitting: Psychiatry

## 2022-07-24 ENCOUNTER — Encounter (HOSPITAL_COMMUNITY): Payer: Self-pay | Admitting: Psychiatry

## 2022-07-24 DIAGNOSIS — F3340 Major depressive disorder, recurrent, in remission, unspecified: Secondary | ICD-10-CM

## 2022-07-24 DIAGNOSIS — F411 Generalized anxiety disorder: Secondary | ICD-10-CM

## 2022-07-24 DIAGNOSIS — F330 Major depressive disorder, recurrent, mild: Secondary | ICD-10-CM

## 2022-07-24 MED ORDER — DULOXETINE HCL 30 MG PO CPEP: 60.0000 mg | ORAL_CAPSULE | Freq: Two times a day (BID) | ORAL | 2 refills | Status: DC

## 2022-08-20 DIAGNOSIS — M546 Pain in thoracic spine: Secondary | ICD-10-CM | POA: Diagnosis not present

## 2022-08-20 DIAGNOSIS — M62830 Muscle spasm of back: Secondary | ICD-10-CM | POA: Diagnosis not present

## 2022-08-20 DIAGNOSIS — M62838 Other muscle spasm: Secondary | ICD-10-CM | POA: Diagnosis not present

## 2022-08-20 DIAGNOSIS — R200 Anesthesia of skin: Secondary | ICD-10-CM | POA: Diagnosis not present

## 2022-08-21 DIAGNOSIS — M542 Cervicalgia: Secondary | ICD-10-CM | POA: Diagnosis not present

## 2022-08-21 DIAGNOSIS — M47812 Spondylosis without myelopathy or radiculopathy, cervical region: Secondary | ICD-10-CM | POA: Diagnosis not present

## 2022-08-21 DIAGNOSIS — M5412 Radiculopathy, cervical region: Secondary | ICD-10-CM | POA: Diagnosis not present

## 2022-08-26 DIAGNOSIS — M542 Cervicalgia: Secondary | ICD-10-CM | POA: Diagnosis not present

## 2022-08-26 DIAGNOSIS — M4802 Spinal stenosis, cervical region: Secondary | ICD-10-CM | POA: Diagnosis not present

## 2022-08-26 DIAGNOSIS — M4722 Other spondylosis with radiculopathy, cervical region: Secondary | ICD-10-CM | POA: Diagnosis not present

## 2022-08-26 DIAGNOSIS — M4803 Spinal stenosis, cervicothoracic region: Secondary | ICD-10-CM | POA: Diagnosis not present

## 2022-08-29 DIAGNOSIS — M5412 Radiculopathy, cervical region: Secondary | ICD-10-CM | POA: Diagnosis not present

## 2022-08-30 ENCOUNTER — Encounter: Payer: Self-pay | Admitting: Family Medicine

## 2022-08-30 ENCOUNTER — Ambulatory Visit: Payer: Medicaid Other | Admitting: Family Medicine

## 2022-08-30 VITALS — BP 132/78 | HR 96 | Temp 98.3°F | Ht 69.25 in | Wt 231.0 lb

## 2022-08-30 DIAGNOSIS — M542 Cervicalgia: Secondary | ICD-10-CM

## 2022-08-30 DIAGNOSIS — G8929 Other chronic pain: Secondary | ICD-10-CM | POA: Diagnosis not present

## 2022-08-30 DIAGNOSIS — E119 Type 2 diabetes mellitus without complications: Secondary | ICD-10-CM | POA: Diagnosis not present

## 2022-08-30 MED ORDER — CYCLOBENZAPRINE HCL 5 MG PO TABS: 5.0000 mg | ORAL_TABLET | Freq: Three times a day (TID) | ORAL | 1 refills | Status: DC | PRN

## 2022-08-30 MED ORDER — OZEMPIC (0.25 OR 0.5 MG/DOSE) 2 MG/3ML ~~LOC~~ SOPN: 0.2500 mg | PEN_INJECTOR | SUBCUTANEOUS | 0 refills | Status: DC

## 2022-08-30 MED ORDER — DICLOFENAC SODIUM 75 MG PO TBEC: 75.0000 mg | DELAYED_RELEASE_TABLET | Freq: Two times a day (BID) | ORAL | 0 refills | Status: DC

## 2022-08-30 MED ORDER — GABAPENTIN 300 MG PO CAPS: 300.0000 mg | ORAL_CAPSULE | Freq: Every day | ORAL | 3 refills | Status: DC

## 2022-08-30 NOTE — Progress Notes (Signed)
   Subjective:    Patient ID: [name] [name], male    DOB: 1975-09-02, 47 y.o.   MRN: [id number]  HPI Here asking for help with weight loss and with treating his diabetes. He has always been very sensitive to medications, and he has been taking the least amount of medications for the diabetes that he can. His A1c in October was 8.7. His am fasting glucoses average 150-200, and he tried to watch his diet. If he tries to increase the dose of either the Glyburide or the Pioglitazone, his glucoses will often bottom out. He asks to try Ozempic.    Review of Systems  Constitutional: Negative.   Respiratory: Negative.    Cardiovascular: Negative.        Objective:   Physical Exam Constitutional:      Appearance: He is obese.  Cardiovascular:     Rate and Rhythm: Normal rate and regular rhythm.     Pulses: Normal pulses.     Heart sounds: Normal heart sounds.  Pulmonary:     Effort: Pulmonary effort is normal.     Breath sounds: Normal breath sounds.  Neurological:     Mental Status: He is alert.           Assessment & Plan:  Type 2 diabetes. He will try Ozempic 0.25 mg weekly for 4 weeks. He will then report back to [place].  Alysia Penna, MD

## 2022-09-09 ENCOUNTER — Telehealth: Payer: Self-pay | Admitting: Family Medicine

## 2022-09-09 NOTE — Telephone Encounter (Signed)
Pt called to request a PA for the Semaglutide,0.25 or 0.5MG /DOS, (OZEMPIC, 0.25 OR 0.5 MG/DOSE,) 2 MG/3ML SOPN   To be sent to his insurance ([employer])  [email or web address]  Fax: [phone]

## 2022-09-10 ENCOUNTER — Other Ambulatory Visit (HOSPITAL_COMMUNITY): Payer: Self-pay

## 2022-09-10 NOTE — Telephone Encounter (Signed)
Patient Advocate Encounter  Prior Authorization for Ozempic (0.25 or 0.5 MG/DOSE) 2MG /3ML pen-injectors has been approved.    PA# [id number] Effective dates: 09/10/22 through 09/10/23

## 2022-09-10 NOTE — Telephone Encounter (Signed)
Pharmacy Patient Advocate Encounter   Received notification that prior authorization for Ozempic (0.25 or 0.5 MG/DOSE) 2MG /3ML pen-injectors is required/requested.  Per Test Claim: T/F of Metformin required   PA submitted on 09/10/22 to (ins) [facility] via CoverMyMeds Key  [id number] Status is pending

## 2022-09-10 NOTE — Telephone Encounter (Signed)
Called patient informed that Prior authorization for Ozempic has been approved.

## 2022-09-13 DIAGNOSIS — M47812 Spondylosis without myelopathy or radiculopathy, cervical region: Secondary | ICD-10-CM | POA: Diagnosis not present

## 2022-09-13 DIAGNOSIS — E119 Type 2 diabetes mellitus without complications: Secondary | ICD-10-CM | POA: Diagnosis not present

## 2022-09-19 DIAGNOSIS — M5412 Radiculopathy, cervical region: Secondary | ICD-10-CM | POA: Diagnosis not present

## 2022-09-27 DIAGNOSIS — M5412 Radiculopathy, cervical region: Secondary | ICD-10-CM | POA: Diagnosis not present

## 2022-10-03 DIAGNOSIS — G5601 Carpal tunnel syndrome, right upper limb: Secondary | ICD-10-CM | POA: Diagnosis not present

## 2022-10-03 DIAGNOSIS — M5412 Radiculopathy, cervical region: Secondary | ICD-10-CM | POA: Diagnosis not present

## 2022-10-04 DIAGNOSIS — M5412 Radiculopathy, cervical region: Secondary | ICD-10-CM | POA: Diagnosis not present

## 2022-10-07 DIAGNOSIS — L0211 Cutaneous abscess of neck: Secondary | ICD-10-CM | POA: Diagnosis not present

## 2022-10-14 ENCOUNTER — Ambulatory Visit: Payer: Medicaid Other | Admitting: Family Medicine

## 2022-10-14 ENCOUNTER — Encounter: Payer: Self-pay | Admitting: Family Medicine

## 2022-10-14 VITALS — BP 142/84 | HR 99 | Temp 98.1°F | Wt 231.0 lb

## 2022-10-14 DIAGNOSIS — E119 Type 2 diabetes mellitus without complications: Secondary | ICD-10-CM | POA: Diagnosis not present

## 2022-10-14 LAB — POCT GLYCOSYLATED HEMOGLOBIN (HGB A1C): Hemoglobin A1C: 8.9 % — AB (ref 4.0–5.6)

## 2022-10-14 MED ORDER — OZEMPIC (0.25 OR 0.5 MG/DOSE) 2 MG/3ML ~~LOC~~ SOPN: 0.5000 mg | PEN_INJECTOR | SUBCUTANEOUS | 5 refills | Status: DC

## 2022-10-14 NOTE — Progress Notes (Signed)
   Subjective:    Patient ID: [name] [name], male    DOB: 09-04-1975, 47 y.o.   MRN: [id number]  HPI Here to follow up on diabetes. His A1c in October was 8.7%, and today it is 8.9%. he started taking Ozempic 0.25 mg weekly 4 weeks ago. He feels well, and his random glucoses have come down a lot to the range of 114 to 150. He recently went to an urgent care for a recurrent abscess on the back of his neck, this was lanced, and a culture revealed Augmentin to be the best antibiotic to use, so he has ben taking this. He is currently scheduled for a cervical spine fusion surgery on 10-22-22, but he knows the abscess needs to be cleared up before this can be done. Also they will want the A1c to be below 7.5% before the procedure. He feels well in general.    Review of Systems  Constitutional: Negative.   Respiratory: Negative.    Cardiovascular: Negative.   Musculoskeletal:  Positive for neck pain.       Objective:   Physical Exam Constitutional:      Appearance: Normal appearance.  Cardiovascular:     Rate and Rhythm: Normal rate and regular rhythm.     Pulses: Normal pulses.     Heart sounds: Normal heart sounds.  Pulmonary:     Effort: Pulmonary effort is normal.     Breath sounds: Normal breath sounds.  Neurological:     Mental Status: He is alert.           Assessment & Plan:  For the diabetes, we will increase the Ozempic to 0.5 mg weekly, and we will initiate the PA paperwork. He will follow up with his in 4 weeks.  Alysia Penna, MD

## 2022-11-13 DIAGNOSIS — M4712 Other spondylosis with myelopathy, cervical region: Secondary | ICD-10-CM | POA: Diagnosis not present

## 2022-11-13 DIAGNOSIS — M5412 Radiculopathy, cervical region: Secondary | ICD-10-CM | POA: Diagnosis not present

## 2022-11-18 ENCOUNTER — Telehealth: Payer: Self-pay | Admitting: Family Medicine

## 2022-11-18 NOTE — Telephone Encounter (Signed)
Discussed prescribing Augmentin for his cysts at 10/14/22 appointment. Requesting that script [facility] [address] [facility] - [place], [place] - [address] AT [address] Phone: [phone]  Fax: [phone]

## 2022-11-19 ENCOUNTER — Other Ambulatory Visit: Payer: Self-pay

## 2022-11-19 DIAGNOSIS — Z79899 Other long term (current) drug therapy: Secondary | ICD-10-CM | POA: Diagnosis not present

## 2022-11-19 DIAGNOSIS — Z9049 Acquired absence of other specified parts of digestive tract: Secondary | ICD-10-CM | POA: Diagnosis not present

## 2022-11-19 DIAGNOSIS — F17210 Nicotine dependence, cigarettes, uncomplicated: Secondary | ICD-10-CM | POA: Diagnosis not present

## 2022-11-19 DIAGNOSIS — E119 Type 2 diabetes mellitus without complications: Secondary | ICD-10-CM | POA: Diagnosis not present

## 2022-11-19 DIAGNOSIS — E785 Hyperlipidemia, unspecified: Secondary | ICD-10-CM | POA: Diagnosis not present

## 2022-11-19 DIAGNOSIS — M5412 Radiculopathy, cervical region: Secondary | ICD-10-CM | POA: Diagnosis not present

## 2022-11-19 DIAGNOSIS — Z7984 Long term (current) use of oral hypoglycemic drugs: Secondary | ICD-10-CM | POA: Diagnosis not present

## 2022-11-19 DIAGNOSIS — F32A Depression, unspecified: Secondary | ICD-10-CM | POA: Diagnosis not present

## 2022-11-19 DIAGNOSIS — M4802 Spinal stenosis, cervical region: Secondary | ICD-10-CM | POA: Diagnosis not present

## 2022-11-19 DIAGNOSIS — F419 Anxiety disorder, unspecified: Secondary | ICD-10-CM | POA: Diagnosis not present

## 2022-11-19 DIAGNOSIS — I10 Essential (primary) hypertension: Secondary | ICD-10-CM | POA: Diagnosis not present

## 2022-11-19 DIAGNOSIS — M4803 Spinal stenosis, cervicothoracic region: Secondary | ICD-10-CM | POA: Diagnosis not present

## 2022-11-19 MED ORDER — AMOXICILLIN-POT CLAVULANATE 875-125 MG PO TABS: 1.0000 | ORAL_TABLET | Freq: Two times a day (BID) | ORAL | 2 refills | Status: DC

## 2022-11-19 NOTE — Telephone Encounter (Signed)
Left detailed message for pt regarding New Rx for Augmentin to his pharmacy

## 2022-11-19 NOTE — Telephone Encounter (Signed)
Call in Augmentin 875 BID, #60 with 2 rf

## 2022-11-19 NOTE — Telephone Encounter (Signed)
Rx sent per Dr Sarajane Jews advise

## 2022-11-25 ENCOUNTER — Telehealth (INDEPENDENT_AMBULATORY_CARE_PROVIDER_SITE_OTHER): Payer: Medicaid Other | Admitting: Family Medicine

## 2022-11-25 ENCOUNTER — Encounter: Payer: Self-pay | Admitting: Family Medicine

## 2022-11-25 DIAGNOSIS — I10 Essential (primary) hypertension: Secondary | ICD-10-CM | POA: Diagnosis not present

## 2022-11-25 DIAGNOSIS — T464X5A Adverse effect of angiotensin-converting-enzyme inhibitors, initial encounter: Secondary | ICD-10-CM

## 2022-11-25 DIAGNOSIS — R058 Other specified cough: Secondary | ICD-10-CM | POA: Diagnosis not present

## 2022-11-25 MED ORDER — LOSARTAN POTASSIUM 50 MG PO TABS: 50.0000 mg | ORAL_TABLET | Freq: Every day | ORAL | 3 refills | Status: DC

## 2022-11-25 NOTE — Progress Notes (Signed)
Subjective:    Patient ID: [name] [name], male    DOB: 1975-10-31, 47 y.o.   MRN: [id number]  HPI Virtual Visit via Video Note  I connected with the patient on 11/25/22 at  1:45 PM EDT by a video enabled telemedicine application and verified that I am speaking with the correct person using two identifiers.  Location patient: home Location provider:work or home office Persons participating in the virtual visit: patient, provider  I discussed the limitations of evaluation and management by telemedicine and the availability of in person appointments. The patient expressed understanding and agreed to proceed.   HPI: Here asking if Lisinopril could be causing him to cough. He has been taking Lisinopril for the past 11 years, and it has worked well for him. However he he has had a dry tickling cough for the past 6 months. He had never mentioned it to [place] because he thought this was due to allergies. Recently his neurosurgeon told him to ask [place] about it.    ROS: See pertinent positives and negatives per HPI.  Past Medical History:  Diagnosis Date   Alcohol abuse    Anxiety    Chronic neck pain    sees Dr. Lamount Cranker (Ortho). He has bulging discs at C5-6 and at C6-7   Depression    Diabetes mellitus without complication    [id number])    Hypertension    Obesity 10/24/2012    Past Surgical History:  Procedure Laterality Date   LAPAROSCOPIC APPENDECTOMY N/A 07/31/2020   Procedure: APPENDECTOMY LAPAROSCOPIC;  Surgeon: Donnie Mesa, MD;  Location: [place];  Service: General;  Laterality: N/A;    Family History  Problem Relation Age of Onset   Hypertension Other    Cancer Other        lung   Depression Other      Current Outpatient Medications:    acetaminophen (TYLENOL) 500 MG tablet, Take 1-2 tablets (500-1,000 mg total) by mouth every 6 (six) hours as needed for mild pain., Disp: 30 tablet, Rfl: 0   amoxicillin-clavulanate (AUGMENTIN) 875-125 MG tablet, Take 1  tablet by mouth 2 (two) times daily., Disp: 60 tablet, Rfl: 2   atorvastatin (LIPITOR) 20 MG tablet, Take 1 tablet (20 mg total) by mouth daily., Disp: 90 tablet, Rfl: 3   fish oil-omega-3 fatty acids 1000 MG capsule, Take 2 g by mouth daily., Disp: , Rfl:    gabapentin (NEURONTIN) 300 MG capsule, Take 1 capsule (300 mg total) by mouth at bedtime., Disp: 30 capsule, Rfl: 3   glyBURIDE micronized (GLYNASE) 1.5 MG tablet, Take 0.5 tablets (0.75 mg total) by mouth 2 (two) times daily with a meal., Disp: 90 tablet, Rfl: 3   lisinopril (ZESTRIL) 20 MG tablet, Take 1 tablet (20 mg total) by mouth daily., Disp: 90 tablet, Rfl: 3   pioglitazone (ACTOS) 30 MG tablet, Take 1 tablet (30 mg total) by mouth daily., Disp: 90 tablet, Rfl: 3   Semaglutide,0.25 or 0.5MG /DOS, (OZEMPIC, 0.25 OR 0.5 MG/DOSE,) 2 MG/3ML SOPN, Inject 0.5 mg into the skin once a week., Disp: 3 mL, Rfl: 5   DULoxetine (CYMBALTA) 30 MG capsule, Take 2 capsules (60 mg total) by mouth 2 (two) times daily., Disp: 120 capsule, Rfl: 2  EXAM:  VITALS per patient if applicable:  GENERAL: alert, oriented, appears well and in no acute distress  HEENT: atraumatic, conjunttiva clear, no obvious abnormalities on inspection of external nose and ears  NECK: normal movements of the head and neck  LUNGS: on inspection no signs of respiratory distress, breathing rate appears normal, no obvious gross SOB, gasping or wheezing  CV: no obvious cyanosis  MS: moves all visible extremities without noticeable abnormality  PSYCH/NEURO: pleasant and cooperative, no obvious depression or anxiety, speech and thought processing grossly intact  ASSESSMENT AND PLAN: ACE inhibitor cough. We will stop the Lisinopril and we will start him on Losartan 50 mg daily. Recheck in 3 months.  Alysia Penna, MD  Discussed the following assessment and plan:  No diagnosis found.     I discussed the assessment and treatment plan with the patient. The patient was  provided an opportunity to ask questions and all were answered. The patient agreed with the plan and demonstrated an understanding of the instructions.   The patient was advised to call back or seek an in-person evaluation if the symptoms worsen or if the condition fails to improve as anticipated.      Review of Systems     Objective:   Physical Exam        Assessment & Plan:

## 2022-11-29 ENCOUNTER — Telehealth: Payer: Self-pay | Admitting: Family Medicine

## 2022-11-29 NOTE — Telephone Encounter (Signed)
Patient checking on progress of a letter that was to be loaded to [employer] regarding insurance renewal.

## 2022-11-29 NOTE — Telephone Encounter (Signed)
I do not recall talking about a letter to his insurance company. What was this about?

## 2022-12-02 NOTE — Telephone Encounter (Signed)
Spoke with pt stated that to hold off with the letter from Dr Clent Ridges since his insurance may cover the Rx Ozempic. Pt advised to call back if any need arise

## 2022-12-03 DIAGNOSIS — M4802 Spinal stenosis, cervical region: Secondary | ICD-10-CM | POA: Diagnosis not present

## 2022-12-03 DIAGNOSIS — Z981 Arthrodesis status: Secondary | ICD-10-CM | POA: Diagnosis not present

## 2023-01-03 DIAGNOSIS — M4802 Spinal stenosis, cervical region: Secondary | ICD-10-CM | POA: Diagnosis not present

## 2023-01-03 DIAGNOSIS — M79601 Pain in right arm: Secondary | ICD-10-CM | POA: Diagnosis not present

## 2023-01-22 DIAGNOSIS — R29898 Other symptoms and signs involving the musculoskeletal system: Secondary | ICD-10-CM | POA: Diagnosis not present

## 2023-01-29 ENCOUNTER — Telehealth (HOSPITAL_COMMUNITY): Payer: Self-pay

## 2023-01-29 NOTE — Telephone Encounter (Signed)
[facility] #120, last ordered 07/24/22 + 2 refills and last filled on 12/20/22. Pt with no current appointment scheduled and last seen 07/24/22.

## 2023-01-30 ENCOUNTER — Other Ambulatory Visit (HOSPITAL_COMMUNITY): Payer: Self-pay | Admitting: Psychiatry

## 2023-01-30 DIAGNOSIS — F330 Major depressive disorder, recurrent, mild: Secondary | ICD-10-CM

## 2023-01-30 DIAGNOSIS — F411 Generalized anxiety disorder: Secondary | ICD-10-CM

## 2023-01-30 DIAGNOSIS — R29898 Other symptoms and signs involving the musculoskeletal system: Secondary | ICD-10-CM | POA: Diagnosis not present

## 2023-01-30 MED ORDER — DULOXETINE HCL 30 MG PO CPEP: 60.0000 mg | ORAL_CAPSULE | Freq: Two times a day (BID) | ORAL | 1 refills | Status: DC

## 2023-02-04 DIAGNOSIS — R29898 Other symptoms and signs involving the musculoskeletal system: Secondary | ICD-10-CM | POA: Diagnosis not present

## 2023-02-05 NOTE — Progress Notes (Signed)
Patient did not connect for virtual psychiatric medication management appointment on 02/07/23 at 10AM. Sent secure video link with no response. Called phone and patient stated he was at emergency vet due to emergency with his dog and requested to reschedule. Rescheduled to 02/12/23 at 2PM by video.  Daine Gip, MD 02/07/23

## 2023-02-06 DIAGNOSIS — R29898 Other symptoms and signs involving the musculoskeletal system: Secondary | ICD-10-CM | POA: Diagnosis not present

## 2023-02-07 ENCOUNTER — Encounter (HOSPITAL_COMMUNITY): Payer: Medicaid Other | Admitting: Psychiatry
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
## 2023-02-12 ENCOUNTER — Telehealth (INDEPENDENT_AMBULATORY_CARE_PROVIDER_SITE_OTHER): Payer: Medicaid Other | Admitting: Psychiatry

## 2023-02-12 ENCOUNTER — Encounter (HOSPITAL_COMMUNITY): Payer: Self-pay | Admitting: Psychiatry

## 2023-02-12 DIAGNOSIS — F3340 Major depressive disorder, recurrent, in remission, unspecified: Secondary | ICD-10-CM

## 2023-02-12 DIAGNOSIS — F330 Major depressive disorder, recurrent, mild: Secondary | ICD-10-CM | POA: Diagnosis not present

## 2023-02-12 DIAGNOSIS — F410 Panic disorder [episodic paroxysmal anxiety] without agoraphobia: Secondary | ICD-10-CM | POA: Diagnosis not present

## 2023-02-12 DIAGNOSIS — F411 Generalized anxiety disorder: Secondary | ICD-10-CM

## 2023-02-12 MED ORDER — DULOXETINE HCL 30 MG PO CPEP: 60.0000 mg | ORAL_CAPSULE | Freq: Two times a day (BID) | ORAL | 3 refills | Status: DC

## 2023-02-12 NOTE — Patient Instructions (Signed)
Thank you for attending your appointment today.  -- We did not make any medication changes today. Please continue medications as prescribed.  Please do not make any changes to medications without first discussing with your provider. If you are experiencing a psychiatric emergency, please call 911 or present to your nearest emergency department. Additional crisis, medication management, and therapy resources are included below.  [facility]  [address], [place], [place] [address] [phone] WALK-IN URGENT CARE 24/7 FOR ANYONE [address], [place], [place]  [phone] Fax: [phone] [email or web address] *Interpreters available *Accepts all insurance and uninsured for Urgent Care needs *Accepts Medicaid and uninsured for outpatient treatment (below)      ONLY FOR [place] Residents  Below:    Outpatient New Patient Assessment/Therapy Walk-ins:        Monday -Thursday 8am until slots are full.        Every Friday 1pm-4pm  (first come, first served)                   New Patient Psychiatry/Medication Management        Monday-Friday 8am-11am (first come, first served)               For all walk-ins we ask that you arrive by 7:15am, because patients will be seen in the order of arrival.   

## 2023-02-14 ENCOUNTER — Telehealth (INDEPENDENT_AMBULATORY_CARE_PROVIDER_SITE_OTHER): Payer: Medicaid Other | Admitting: Family Medicine

## 2023-02-14 ENCOUNTER — Encounter: Payer: Self-pay | Admitting: Family Medicine

## 2023-02-14 DIAGNOSIS — E119 Type 2 diabetes mellitus without complications: Secondary | ICD-10-CM | POA: Diagnosis not present

## 2023-02-14 DIAGNOSIS — J3089 Other allergic rhinitis: Secondary | ICD-10-CM

## 2023-02-14 MED ORDER — SEMAGLUTIDE (1 MG/DOSE) 4 MG/3ML ~~LOC~~ SOPN: 1.0000 mg | PEN_INJECTOR | SUBCUTANEOUS | 5 refills | Status: DC

## 2023-02-14 MED ORDER — GLYBURIDE MICRONIZED 1.5 MG PO TABS: 0.7500 mg | ORAL_TABLET | Freq: Every day | ORAL | 3 refills | Status: DC

## 2023-02-14 NOTE — Progress Notes (Signed)
Subjective:    Patient ID: [name] [name], male    DOB: 1976/05/10, 47 y.o.   MRN: [id number]  HPI Virtual Visit via Video Note  I connected with the patient on 02/14/23 at  1:30 PM EDT by a video enabled telemedicine application and verified that I am speaking with the correct person using two identifiers.  Location patient: home Location provider:work or home office Persons participating in the virtual visit: patient, provider  I discussed the limitations of evaluation and management by telemedicine and the availability of in person appointments. The patient expressed understanding and agreed to proceed.   HPI: Here for two issues. First his allergies have been terrible for several months. He has itchy eyes, sneezing, PND, dry cough, and wheezing. He has tried Xyzal, Claritin, and Zyrtec with no benefit. He sometimes uses his girlfriend's inhaler, and this helps. He wants to see a specialist. Second he asks to move the Ozempic dose to 1 mg weekly. His glucose readings are coming down, and he has been able to stop the evening dose of Glyburide. His fasting values average 100-120.    ROS: See pertinent positives and negatives per HPI.  Past Medical History:  Diagnosis Date   Alcohol abuse    Anxiety    Chronic neck pain    sees Dr. Sadie Haber (Ortho). He has bulging discs at C5-6 and at C6-7   Depression    Diabetes mellitus without complication (HCC)    Headache(784.0)    Hypertension    Obesity 10/24/2012    Past Surgical History:  Procedure Laterality Date   LAPAROSCOPIC APPENDECTOMY N/A 07/31/2020   Procedure: APPENDECTOMY LAPAROSCOPIC;  Surgeon: Manus Rudd, MD;  Location: [facility] OR;  Service: General;  Laterality: N/A;    Family History  Problem Relation Age of Onset   Hypertension Other    Cancer Other        lung   Depression Other      Current Outpatient Medications:    acetaminophen (TYLENOL) 500 MG tablet, Take 1-2 tablets (500-1,000 mg total) by  mouth every 6 (six) hours as needed for mild pain., Disp: 30 tablet, Rfl: 0   atorvastatin (LIPITOR) 20 MG tablet, Take 1 tablet (20 mg total) by mouth daily., Disp: 90 tablet, Rfl: 3   DULoxetine (CYMBALTA) 30 MG capsule, Take 2 capsules (60 mg total) by mouth 2 (two) times daily., Disp: 120 capsule, Rfl: 3   fish oil-omega-3 fatty acids 1000 MG capsule, Take 2 g by mouth daily., Disp: , Rfl:    losartan (COZAAR) 50 MG tablet, Take 1 tablet (50 mg total) by mouth daily., Disp: 90 tablet, Rfl: 3   pioglitazone (ACTOS) 30 MG tablet, Take 1 tablet (30 mg total) by mouth daily., Disp: 90 tablet, Rfl: 3   Semaglutide, 1 MG/DOSE, 4 MG/3ML SOPN, Inject 1 mg as directed once a week., Disp: 3 mL, Rfl: 5   amoxicillin-clavulanate (AUGMENTIN) 875-125 MG tablet, Take 1 tablet by mouth 2 (two) times daily. (Patient not taking: Reported on 02/14/2023), Disp: 60 tablet, Rfl: 2   glyBURIDE micronized (GLYNASE) 1.5 MG tablet, Take 0.5 tablets (0.75 mg total) by mouth daily with breakfast., Disp: 90 tablet, Rfl: 3  EXAM:  VITALS per patient if applicable:  GENERAL: alert, oriented, appears well and in no acute distress  HEENT: atraumatic, conjunttiva clear, no obvious abnormalities on inspection of external nose and ears  NECK: normal movements of the head and neck  LUNGS: on inspection no signs of respiratory distress,  breathing rate appears normal, no obvious gross SOB, gasping or wheezing  CV: no obvious cyanosis  MS: moves all visible extremities without noticeable abnormality  PSYCH/NEURO: pleasant and cooperative, no obvious depression or anxiety, speech and thought processing grossly intact  ASSESSMENT AND PLAN: For the allergies, we will refer him to Allergy for evaluation. For the diabetes, we will increase the Ozempic shots to 1 mg weekly.  Gershon Crane, MD  Discussed the following assessment and plan:  Type 2 diabetes mellitus without complication, without long-term current use of insulin  (HCC)  Environmental and seasonal allergies - Plan: Ambulatory referral to Allergy     I discussed the assessment and treatment plan with the patient. The patient was provided an opportunity to ask questions and all were answered. The patient agreed with the plan and demonstrated an understanding of the instructions.   The patient was advised to call back or seek an in-person evaluation if the symptoms worsen or if the condition fails to improve as anticipated.      Review of Systems     Objective:   Physical Exam        Assessment & Plan:

## 2023-03-07 DIAGNOSIS — F17210 Nicotine dependence, cigarettes, uncomplicated: Secondary | ICD-10-CM | POA: Diagnosis not present

## 2023-03-07 DIAGNOSIS — J3089 Other allergic rhinitis: Secondary | ICD-10-CM | POA: Diagnosis not present

## 2023-03-07 DIAGNOSIS — H1045 Other chronic allergic conjunctivitis: Secondary | ICD-10-CM | POA: Diagnosis not present

## 2023-03-07 DIAGNOSIS — R052 Subacute cough: Secondary | ICD-10-CM | POA: Diagnosis not present

## 2023-03-11 DIAGNOSIS — R29898 Other symptoms and signs involving the musculoskeletal system: Secondary | ICD-10-CM | POA: Diagnosis not present

## 2023-03-13 ENCOUNTER — Other Ambulatory Visit: Payer: Self-pay | Admitting: Allergy

## 2023-03-13 ENCOUNTER — Ambulatory Visit
Admission: RE | Admit: 2023-03-13 | Discharge: 2023-03-13 | Disposition: A | Discharge status: Home / Self Care | Payer: Medicaid Other | Source: Ambulatory Visit | Attending: Allergy | Admitting: Allergy

## 2023-03-13 DIAGNOSIS — R059 Cough, unspecified: Secondary | ICD-10-CM | POA: Diagnosis not present

## 2023-03-13 DIAGNOSIS — R052 Subacute cough: Secondary | ICD-10-CM

## 2023-03-24 DIAGNOSIS — R0602 Shortness of breath: Secondary | ICD-10-CM | POA: Diagnosis not present

## 2023-03-24 DIAGNOSIS — F419 Anxiety disorder, unspecified: Secondary | ICD-10-CM | POA: Diagnosis not present

## 2023-03-24 DIAGNOSIS — F17200 Nicotine dependence, unspecified, uncomplicated: Secondary | ICD-10-CM | POA: Diagnosis not present

## 2023-03-24 DIAGNOSIS — K589 Irritable bowel syndrome without diarrhea: Secondary | ICD-10-CM | POA: Diagnosis not present

## 2023-03-24 DIAGNOSIS — F32A Depression, unspecified: Secondary | ICD-10-CM | POA: Diagnosis not present

## 2023-03-24 DIAGNOSIS — R079 Chest pain, unspecified: Secondary | ICD-10-CM | POA: Diagnosis not present

## 2023-03-24 DIAGNOSIS — E119 Type 2 diabetes mellitus without complications: Secondary | ICD-10-CM | POA: Diagnosis not present

## 2023-03-24 DIAGNOSIS — I10 Essential (primary) hypertension: Secondary | ICD-10-CM | POA: Diagnosis not present

## 2023-04-04 DIAGNOSIS — M542 Cervicalgia: Secondary | ICD-10-CM | POA: Diagnosis not present

## 2023-04-10 ENCOUNTER — Telehealth: Payer: Self-pay | Admitting: Family Medicine

## 2023-04-10 DIAGNOSIS — R0602 Shortness of breath: Secondary | ICD-10-CM | POA: Diagnosis not present

## 2023-04-10 DIAGNOSIS — K5900 Constipation, unspecified: Secondary | ICD-10-CM

## 2023-04-10 NOTE — Telephone Encounter (Signed)
I did the referral to GI °

## 2023-04-10 NOTE — Telephone Encounter (Signed)
Pt states that Dr Clent Ridges is aware that pt has been having digestion problems, bloating and constipation. Please advise

## 2023-04-10 NOTE — Telephone Encounter (Signed)
Requesting a referral to gastroenterologist

## 2023-04-11 NOTE — Telephone Encounter (Signed)
Spoke with the patient, informed him the referral was placed and someone will contact him with appt info.

## 2023-04-15 ENCOUNTER — Other Ambulatory Visit: Payer: Self-pay | Admitting: Family Medicine

## 2023-04-17 DIAGNOSIS — R0602 Shortness of breath: Secondary | ICD-10-CM | POA: Diagnosis not present

## 2023-04-21 DIAGNOSIS — R053 Chronic cough: Secondary | ICD-10-CM | POA: Diagnosis not present

## 2023-04-21 DIAGNOSIS — R0602 Shortness of breath: Secondary | ICD-10-CM | POA: Diagnosis not present

## 2023-04-21 DIAGNOSIS — J45909 Unspecified asthma, uncomplicated: Secondary | ICD-10-CM | POA: Diagnosis not present

## 2023-05-12 DIAGNOSIS — R490 Dysphonia: Secondary | ICD-10-CM | POA: Diagnosis not present

## 2023-05-12 DIAGNOSIS — R0602 Shortness of breath: Secondary | ICD-10-CM | POA: Diagnosis not present

## 2023-05-12 DIAGNOSIS — R053 Chronic cough: Secondary | ICD-10-CM | POA: Diagnosis not present

## 2023-05-12 DIAGNOSIS — R1319 Other dysphagia: Secondary | ICD-10-CM | POA: Diagnosis not present
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
## 2023-05-16 ENCOUNTER — Telehealth (INDEPENDENT_AMBULATORY_CARE_PROVIDER_SITE_OTHER): Payer: Medicaid Other | Admitting: Psychiatry

## 2023-05-16 ENCOUNTER — Encounter (HOSPITAL_COMMUNITY): Payer: Self-pay | Admitting: Psychiatry

## 2023-05-16 DIAGNOSIS — F3340 Major depressive disorder, recurrent, in remission, unspecified: Secondary | ICD-10-CM | POA: Diagnosis not present

## 2023-05-16 DIAGNOSIS — F17210 Nicotine dependence, cigarettes, uncomplicated: Secondary | ICD-10-CM

## 2023-05-16 DIAGNOSIS — F17200 Nicotine dependence, unspecified, uncomplicated: Secondary | ICD-10-CM

## 2023-05-16 DIAGNOSIS — F411 Generalized anxiety disorder: Secondary | ICD-10-CM

## 2023-05-16 DIAGNOSIS — F410 Panic disorder [episodic paroxysmal anxiety] without agoraphobia: Secondary | ICD-10-CM

## 2023-05-16 MED ORDER — NICOTINE POLACRILEX 4 MG MT GUM: 4.0000 mg | CHEWING_GUM | OROMUCOSAL | 5 refills | Status: DC | PRN

## 2023-05-16 MED ORDER — DULOXETINE HCL 30 MG PO CPEP
60.0000 mg | ORAL_CAPSULE | Freq: Two times a day (BID) | ORAL | 3 refills | Status: DC
Start: 2023-05-16 — End: 2023-08-29

## 2023-05-16 MED ORDER — NICOTINE 21 MG/24HR TD PT24: 21.0000 mg | MEDICATED_PATCH | Freq: Every day | TRANSDERMAL | 5 refills | Status: DC

## 2023-05-16 NOTE — Patient Instructions (Addendum)
Thank you for attending your appointment today.  -- START nicotine patch 21 mg daily (remove before bedtime) + gum as needed for breakthrough cravings -- Continue other medications as prescribed.  Please do not make any changes to medications without first discussing with your provider. If you are experiencing a psychiatric emergency, please call 911 or present to your nearest emergency department. Additional crisis, medication management, and therapy resources are included below.  [facility]  [address] [place], [place] [address] [phone] WALK-IN URGENT CARE 24/7 FOR ANYONE [address] [place], [place]  [phone] Fax: [phone] [email or web address] *Interpreters available *Accepts all insurance and uninsured for Urgent Care needs *Accepts Medicaid and uninsured for outpatient treatment (below)      ONLY FOR [facility]  Below:    Outpatient New Patient Assessment/Therapy Walk-ins:        Monday -Thursday 8am until slots are full.        Every Friday 1pm-4pm  (first come, first served)                   New Patient Psychiatry/Medication Management        Monday-Friday 8am-11am (first come, first served)               For all walk-ins we ask that you arrive by 7:15am, because patients will be seen in the order of arrival.

## 2023-05-18 ENCOUNTER — Other Ambulatory Visit: Payer: Self-pay | Admitting: Family Medicine

## 2023-05-21 ENCOUNTER — Encounter: Payer: Self-pay | Admitting: Gastroenterology

## 2023-06-23 ENCOUNTER — Ambulatory Visit (INDEPENDENT_AMBULATORY_CARE_PROVIDER_SITE_OTHER): Payer: Medicaid Other | Admitting: Family Medicine

## 2023-06-23 ENCOUNTER — Encounter: Payer: Self-pay | Admitting: Family Medicine

## 2023-06-23 VITALS — BP 128/86 | HR 86 | Temp 98.6°F | Ht 69.5 in | Wt 227.0 lb

## 2023-06-23 DIAGNOSIS — F411 Generalized anxiety disorder: Secondary | ICD-10-CM | POA: Diagnosis not present

## 2023-06-23 DIAGNOSIS — Z0000 Encounter for general adult medical examination without abnormal findings: Secondary | ICD-10-CM

## 2023-06-23 DIAGNOSIS — G4730 Sleep apnea, unspecified: Secondary | ICD-10-CM

## 2023-06-23 DIAGNOSIS — F410 Panic disorder [episodic paroxysmal anxiety] without agoraphobia: Secondary | ICD-10-CM

## 2023-06-23 DIAGNOSIS — I10 Essential (primary) hypertension: Secondary | ICD-10-CM | POA: Diagnosis not present

## 2023-06-23 DIAGNOSIS — E119 Type 2 diabetes mellitus without complications: Secondary | ICD-10-CM | POA: Diagnosis not present

## 2023-06-23 LAB — LIPID PANEL
Cholesterol: 154 mg/dL (ref 0–200)
HDL: 45 mg/dL (ref >39.00)
LDL Cholesterol: 81 mg/dL (ref 0–99)
NonHDL: 108.56
Total CHOL/HDL Ratio: 3
Triglycerides: 140 mg/dL (ref 0.0–149.0)
VLDL: 28 mg/dL (ref 0.0–40.0)

## 2023-06-23 LAB — BASIC METABOLIC PANEL
BUN: 12 mg/dL (ref 6–23)
CO2: 28 meq/L (ref 19–32)
Calcium: 9.5 mg/dL (ref 8.4–10.5)
Chloride: 99 meq/L (ref 96–112)
Creatinine, Ser: 0.69 mg/dL (ref 0.40–1.50)
GFR: 111.32 mL/min (ref >60.00)
Glucose, Bld: 299 mg/dL — ABNORMAL HIGH (ref 70–99)
Potassium: 3.9 meq/L (ref 3.5–5.1)
Sodium: 135 meq/L (ref 135–145)

## 2023-06-23 LAB — HEPATIC FUNCTION PANEL
ALT: 19 U/L (ref 0–53)
AST: 15 U/L (ref 0–37)
Albumin: 4.1 g/dL (ref 3.5–5.2)
Alkaline Phosphatase: 88 U/L (ref 39–117)
Bilirubin, Direct: 0.1 mg/dL (ref 0.0–0.3)
Total Bilirubin: 0.5 mg/dL (ref 0.2–1.2)
Total Protein: 7 g/dL (ref 6.0–8.3)

## 2023-06-23 LAB — CBC WITH DIFFERENTIAL/PLATELET
Basophils Absolute: 0 10*3/uL (ref 0.0–0.1)
Basophils Relative: 0.5 % (ref 0.0–3.0)
Eosinophils Absolute: 0.2 10*3/uL (ref 0.0–0.7)
Eosinophils Relative: 2.9 % (ref 0.0–5.0)
HCT: 43.7 % (ref 39.0–52.0)
Hemoglobin: 14.2 g/dL (ref 13.0–17.0)
Lymphocytes Relative: 23.9 % (ref 12.0–46.0)
Lymphs Abs: 1.9 10*3/uL (ref 0.7–4.0)
MCHC: 32.5 g/dL (ref 30.0–36.0)
MCV: 97.8 fL (ref 78.0–100.0)
Monocytes Absolute: 0.5 10*3/uL (ref 0.1–1.0)
Monocytes Relative: 6.6 % (ref 3.0–12.0)
Neutro Abs: 5.4 10*3/uL (ref 1.4–7.7)
Neutrophils Relative %: 66.1 % (ref 43.0–77.0)
Platelets: 184 10*3/uL (ref 150.0–400.0)
RBC: 4.47 Mil/uL (ref 4.22–5.81)
RDW: 13.5 % (ref 11.5–15.5)
WBC: 8.1 10*3/uL (ref 4.0–10.5)

## 2023-06-23 LAB — HEMOGLOBIN A1C: Hgb A1c MFr Bld: 8.2 % — ABNORMAL HIGH (ref 4.6–6.5)

## 2023-06-23 LAB — TSH: TSH: 1.59 u[IU]/mL (ref 0.35–5.50)

## 2023-06-23 MED ORDER — EMPAGLIFLOZIN 25 MG PO TABS: 25.0000 mg | ORAL_TABLET | Freq: Every day | ORAL | 3 refills | Status: DC

## 2023-06-23 NOTE — Progress Notes (Signed)
Subjective:    Patient ID: [name] [name], male    DOB: 1976/03/18, 47 y.o.   MRN: [id number]  HPI Here to follow up on issues. He feels well in general. Since his cervical spine surgery, he has much less neck pain. His BP is stable. His AM fasting glucoses are averaging 110-140. His depression and anxiety are well controlled. He asks for sleep apnea testing because his girlfriend says he snores and he frequently stops breathing during his sleep.    Review of Systems  Constitutional: Negative.   HENT: Negative.    Eyes: Negative.   Respiratory: Negative.    Cardiovascular: Negative.   Gastrointestinal: Negative.   Genitourinary: Negative.   Musculoskeletal: Negative.   Skin: Negative.   Neurological: Negative.   Psychiatric/Behavioral: Negative.         Objective:   Physical Exam Constitutional:      General: He is not in acute distress.    Appearance: Normal appearance. He is well-developed. He is not diaphoretic.  HENT:     Head: Normocephalic and atraumatic.     Right Ear: External ear normal.     Left Ear: External ear normal.     Nose: Nose normal.     Mouth/Throat:     Pharynx: No oropharyngeal exudate.  Eyes:     General: No scleral icterus.       Right eye: No discharge.        Left eye: No discharge.     Conjunctiva/sclera: Conjunctivae normal.     Pupils: Pupils are equal, round, and reactive to light.  Neck:     Thyroid: No thyromegaly.     Vascular: No JVD.     Trachea: No tracheal deviation.  Cardiovascular:     Rate and Rhythm: Normal rate and regular rhythm.     Pulses: Normal pulses.     Heart sounds: Normal heart sounds. No murmur heard.    No friction rub. No gallop.  Pulmonary:     Effort: Pulmonary effort is normal. No respiratory distress.     Breath sounds: Normal breath sounds. No wheezing or rales.  Chest:     Chest wall: No tenderness.  Abdominal:     General: Bowel sounds are normal. There is no distension.     Palpations:  Abdomen is soft. There is no mass.     Tenderness: There is no abdominal tenderness. There is no guarding or rebound.  Genitourinary:    Penis: Normal. No tenderness.      Testes: Normal.  Musculoskeletal:        General: No tenderness. Normal range of motion.     Cervical back: Neck supple.  Lymphadenopathy:     Cervical: No cervical adenopathy.  Skin:    General: Skin is warm and dry.     Coloration: Skin is not pale.     Findings: No erythema or rash.  Neurological:     General: No focal deficit present.     Mental Status: He is alert and oriented to person, place, and time.     Cranial Nerves: No cranial nerve deficit.     Motor: No abnormal muscle tone.     Coordination: Coordination normal.     Deep Tendon Reflexes: Reflexes are normal and symmetric. Reflexes normal.  Psychiatric:        Mood and Affect: Mood normal.        Behavior: Behavior normal.        Thought Content: Thought content  normal.        Judgment: Judgment normal.           Assessment & Plan:  His HTN is swell controlled. His depression and anxiety seem to be stable. His neck pain has been alleviated. We will refer him for a sleep apnea study. Get fasting labs to check an A1c, lipids, etc. We spent a total of (35   ) minutes reviewing records and discussing these issues.  Gershon Crane, MD

## 2023-06-23 NOTE — Addendum Note (Signed)
Addended by: Gershon Crane A on: 06/23/2023 05:05 PM   Modules accepted: Orders

## 2023-07-11 DIAGNOSIS — M542 Cervicalgia: Secondary | ICD-10-CM | POA: Diagnosis not present

## 2023-07-14 DIAGNOSIS — E119 Type 2 diabetes mellitus without complications: Secondary | ICD-10-CM | POA: Diagnosis not present

## 2023-07-14 DIAGNOSIS — L0211 Cutaneous abscess of neck: Secondary | ICD-10-CM | POA: Diagnosis not present

## 2023-07-14 DIAGNOSIS — Z8639 Personal history of other endocrine, nutritional and metabolic disease: Secondary | ICD-10-CM | POA: Diagnosis not present

## 2023-07-28 ENCOUNTER — Telehealth: Payer: Self-pay | Admitting: Family Medicine

## 2023-07-28 NOTE — Telephone Encounter (Signed)
Pt call and stated he need a PA for empagliflozin (JARDIANCE) 25 MG TABS tablet and  Semaglutide, 1 MG/DOSE, 4 MG/3ML SOPN pt want to know if you want to go up on it and want it sent to [facility] [address] - [place], [place] - [address] Phone: [phone]  Fax: [phone]

## 2023-07-30 ENCOUNTER — Telehealth: Payer: Self-pay

## 2023-07-30 ENCOUNTER — Other Ambulatory Visit (HOSPITAL_COMMUNITY): Payer: Self-pay

## 2023-07-30 NOTE — Telephone Encounter (Signed)
Pharmacy Patient Advocate Encounter   Received notification from Pt Calls Messages that prior authorization for Jardiance 25MG  tablets is required/requested.   Insurance verification completed.   The patient is insured through [facility] .   Per test claim: PA required and submitted KEY/EOC/Request #: [id number] APPROVED from 07/30/23 to 07/29/24. Ran test claim, Copay is $4. This test claim was processed through [facility]- copay amounts may vary at other pharmacies due to pharmacy/plan contracts, or as the patient moves through the different stages of their insurance plan.    PA Case ID #: [id number]

## 2023-07-30 NOTE — Telephone Encounter (Signed)
PA request has been Approved. New Encounter created for follow up. For additional info see Pharmacy Prior Auth telephone encounter from 07/30/23.

## 2023-08-15 ENCOUNTER — Telehealth (HOSPITAL_COMMUNITY): Payer: Medicaid Other | Admitting: Psychiatry

## 2023-08-18 ENCOUNTER — Other Ambulatory Visit: Payer: Self-pay | Admitting: Family Medicine
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
## 2023-08-25 ENCOUNTER — Telehealth: Payer: Self-pay

## 2023-08-26 NOTE — Telephone Encounter (Signed)
 Noted. Cancel the Jardiance 25 mg and call in Jardiance 10 mg daily, #30 with 5 rf

## 2023-08-28 ENCOUNTER — Ambulatory Visit: Payer: Medicaid Other | Admitting: Gastroenterology

## 2023-08-28 ENCOUNTER — Other Ambulatory Visit: Payer: Self-pay

## 2023-08-28 MED ORDER — EMPAGLIFLOZIN 10 MG PO TABS: 10.0000 mg | ORAL_TABLET | Freq: Every day | ORAL | 5 refills | Status: DC

## 2023-08-28 NOTE — Telephone Encounter (Signed)
 Jardiance 25 mg has been d/c new Rx for Jardiance 10 mg sent to pt pharmacy. Pt notified via MyChart

## 2023-08-28 NOTE — Progress Notes (Deleted)
 HPI : [name] [name] is a 48 y.o. male with a history of diabetes, hypertension, anxiety/depression who is referred to us  by Johnny Garnette LABOR, MD for colon cancer screening and further management of chronic constipation.     Past Medical History:  Diagnosis Date   Alcohol abuse    Anxiety    Chronic neck pain    sees Dr. Frederic Don (Ortho). He has bulging discs at C5-6 and at C6-7   Depression    sees Dr. Lauraine Pummel   Diabetes mellitus without complication (HCC)    Headache(784.0)    Hypertension    Obesity 10/24/2012     Past Surgical History:  Procedure Laterality Date   LAPAROSCOPIC APPENDECTOMY N/A 07/31/2020   Procedure: APPENDECTOMY LAPAROSCOPIC;  Surgeon: Belinda Cough, MD;  Location: [facility] OR;  Service: General;  Laterality: N/A;   Family History  Problem Relation Age of Onset   Hypertension Other    Cancer Other        lung   Depression Other    Social History   Tobacco Use   Smoking status: Every Day    Current packs/day: 1.00    Types: Cigarettes   Smokeless tobacco: Never  Substance Use Topics   Alcohol use: Yes    Comment: 4-5 beers once per week   Drug use: No   Current Outpatient Medications  Medication Sig Dispense Refill   acetaminophen  (TYLENOL ) 500 MG tablet Take 1-2 tablets (500-1,000 mg total) by mouth every 6 (six) hours as needed for mild pain. 30 tablet 0   atorvastatin  (LIPITOR) 20 MG tablet TAKE ONE TABLET BY MOUTH ONE TIME DAILY 90 tablet 3   DULoxetine  (CYMBALTA ) 30 MG capsule Take 2 capsules (60 mg total) by mouth 2 (two) times daily. 120 capsule 3   empagliflozin  (JARDIANCE ) 25 MG TABS tablet Take 1 tablet (25 mg total) by mouth daily before breakfast. 90 tablet 3   fish oil-omega-3 fatty acids 1000 MG capsule Take 2 g by mouth daily.     glyBURIDE  micronized (GLYNASE ) 1.5 MG tablet TAKE ONE-HALF TABLET BY MOUTH TWICE A DAY WITH A MEAL 90 tablet 3   losartan  (COZAAR ) 50 MG tablet Take 1 tablet (50 mg total) by mouth daily. 90  tablet 3   nicotine  (NICODERM CQ  - DOSED IN MG/24 HOURS) 21 mg/24hr patch Place 1 patch (21 mg total) onto the skin daily. 28 patch 5   nicotine  polacrilex (NICORETTE ) 4 MG gum Take 1 each (4 mg total) by mouth as needed for smoking cessation. 100 tablet 5   Semaglutide , 1 MG/DOSE, (OZEMPIC , 1 MG/DOSE,) 4 MG/3ML SOPN INJECT 1MG  UNDER THE SKIN EVERY WEEK 3 mL 5   No current facility-administered medications for this visit.   Allergies  Allergen Reactions   Lisinopril  Cough   Metformin  And Related Other (See Comments)    Abdominal cramps      Review of Systems: All systems reviewed and negative except where noted in HPI.    No results found.  Physical Exam: There were no vitals taken for this visit. Constitutional: Pleasant,well-developed, ***male in no acute distress. HEENT: Normocephalic and atraumatic. Conjunctivae are normal. No scleral icterus. Neck supple.  Cardiovascular: Normal rate, regular rhythm.  Pulmonary/chest: Effort normal and breath sounds normal. No wheezing, rales or rhonchi. Abdominal: Soft, nondistended, nontender. Bowel sounds active throughout. There are no masses palpable. No hepatomegaly. Extremities: no edema Lymphadenopathy: No cervical adenopathy noted. Neurological: Alert and oriented to person place and time. Skin: Skin is  warm and dry. No rashes noted. Psychiatric: Normal mood and affect. Behavior is normal.  CBC    Component Value Date/Time   WBC 8.1 06/23/2023 1023   RBC 4.47 06/23/2023 1023   HGB 14.2 06/23/2023 1023   HGB 16.0 03/01/2013 1557   HCT 43.7 06/23/2023 1023   HCT 46.0 03/01/2013 1557   PLT 184.0 06/23/2023 1023   PLT 153 03/01/2013 1557   MCV 97.8 06/23/2023 1023   MCV 97 03/01/2013 1557   MCH 34.4 (H) 07/30/2020 1954   MCHC 32.5 06/23/2023 1023   RDW 13.5 06/23/2023 1023   RDW 13.1 03/01/2013 1557   LYMPHSABS 1.9 06/23/2023 1023   MONOABS 0.5 06/23/2023 1023   EOSABS 0.2 06/23/2023 1023   BASOSABS 0.0 06/23/2023  1023    CMP     Component Value Date/Time   NA 135 06/23/2023 1023   NA 138 03/01/2013 1557   K 3.9 06/23/2023 1023   K 3.9 03/01/2013 1557   CL 99 06/23/2023 1023   CL 105 03/01/2013 1557   CO2 28 06/23/2023 1023   CO2 25 03/01/2013 1557   GLUCOSE 299 (H) 06/23/2023 1023   GLUCOSE 135 (H) 03/01/2013 1557   GLUCOSE 120 (H) 06/30/2006 1154   BUN 12 06/23/2023 1023   BUN 10 03/01/2013 1557   CREATININE 0.69 06/23/2023 1023   CREATININE 0.65 03/01/2013 1557   CALCIUM  9.5 06/23/2023 1023   CALCIUM  9.5 03/01/2013 1557   PROT 7.0 06/23/2023 1023   PROT 7.6 03/01/2013 1557   ALBUMIN 4.1 06/23/2023 1023   ALBUMIN 3.8 03/01/2013 1557   AST 15 06/23/2023 1023   AST 23 03/01/2013 1557   ALT 19 06/23/2023 1023   ALT 45 03/01/2013 1557   ALKPHOS 88 06/23/2023 1023   ALKPHOS 98 03/01/2013 1557   BILITOT 0.5 06/23/2023 1023   BILITOT 0.5 03/01/2013 1557   GFRNONAA >60 07/30/2020 1954   GFRNONAA >60 03/01/2013 1557   GFRAA >60 03/01/2013 1557       Latest Ref Rng & Units 06/23/2023   10:23 AM 06/19/2022   10:50 AM 03/15/2021    8:30 AM  CBC EXTENDED  WBC 4.0 - 10.5 K/uL 8.1  6.5  5.8   RBC 4.22 - 5.81 Mil/uL 4.47  4.26  4.68   Hemoglobin 13.0 - 17.0 g/dL 85.7  85.5  83.4   HCT 39.0 - 52.0 % 43.7  42.4  48.0   Platelets 150.0 - 400.0 K/uL 184.0  153.0  165.0   NEUT# 1.4 - 7.7 K/uL 5.4  3.9  3.3   Lymph# 0.7 - 4.0 K/uL 1.9  1.9  1.8       ASSESSMENT AND PLAN:  Johnny Garnette LABOR, MD

## 2023-08-29 ENCOUNTER — Encounter (HOSPITAL_COMMUNITY): Payer: Self-pay | Admitting: Psychiatry

## 2023-08-29 ENCOUNTER — Other Ambulatory Visit (HOSPITAL_COMMUNITY): Payer: Self-pay

## 2023-08-29 ENCOUNTER — Telehealth (HOSPITAL_COMMUNITY): Payer: Medicaid Other | Admitting: Psychiatry

## 2023-08-29 ENCOUNTER — Telehealth: Payer: Self-pay

## 2023-08-29 DIAGNOSIS — F3340 Major depressive disorder, recurrent, in remission, unspecified: Secondary | ICD-10-CM | POA: Diagnosis not present

## 2023-08-29 DIAGNOSIS — F411 Generalized anxiety disorder: Secondary | ICD-10-CM | POA: Diagnosis not present

## 2023-08-29 DIAGNOSIS — F410 Panic disorder [episodic paroxysmal anxiety] without agoraphobia: Secondary | ICD-10-CM

## 2023-08-29 MED ORDER — DULOXETINE HCL 30 MG PO CPEP: 60.0000 mg | ORAL_CAPSULE | Freq: Two times a day (BID) | ORAL | 3 refills | Status: DC

## 2023-08-29 NOTE — Telephone Encounter (Signed)
 Pharmacy Patient Advocate Encounter  Received notification from [facility] that Prior Authorization for Ozempic  (0.25 or 0.5 MG/DOSE) 2MG /3ML pen-injectors has been APPROVED from 08/26/23 to 02/23/24. Ran test claim, Copay is $4.00. This test claim was processed through [facility]- copay amounts may vary at other pharmacies due to pharmacy/plan contracts, or as the patient moves through the different stages of their insurance plan.   PA #/Case ID/Reference #: [id number]

## 2023-08-29 NOTE — Patient Instructions (Addendum)
 Thank you for attending your appointment today.  -- We did not make any medication changes today. Please continue medications as prescribed. -- I have attached information about Chantix for your reading.   Please do not make any changes to medications without first discussing with your provider. If you are experiencing a psychiatric emergency, please call 911 or present to your nearest emergency department. Additional crisis, medication management, and therapy resources are included below.  [facility]  [address] [place], [place] [address] [phone] WALK-IN URGENT CARE 24/7 FOR ANYONE [address], [place], [place]  [phone] Fax: [phone] [email or web address] *Interpreters available *Accepts all insurance and uninsured for Urgent Care needs *Accepts Medicaid and uninsured for outpatient treatment (below)      ONLY FOR [facility]  Below:    Outpatient New Patient Assessment/Therapy Walk-ins:        Monday, Wednesday, and Thursday 8am until slots are full (first come, first served)                   New Patient Psychiatry/Medication Management        Monday-Friday 8am-11am (first come, first served)               For all walk-ins we ask that you arrive by 7:15am, because patients will be seen in the order of arrival.

## 2023-09-24 ENCOUNTER — Other Ambulatory Visit: Payer: Medicaid Other

## 2023-10-27 DIAGNOSIS — E119 Type 2 diabetes mellitus without complications: Secondary | ICD-10-CM

## 2023-11-03 ENCOUNTER — Other Ambulatory Visit (INDEPENDENT_AMBULATORY_CARE_PROVIDER_SITE_OTHER): Payer: Medicaid Other

## 2023-11-03 DIAGNOSIS — E119 Type 2 diabetes mellitus without complications: Secondary | ICD-10-CM

## 2023-11-03 LAB — HEMOGLOBIN A1C: Hgb A1c MFr Bld: 7.8 % — ABNORMAL HIGH (ref 4.6–6.5)

## 2023-11-03 NOTE — Telephone Encounter (Signed)
 Done

## 2023-11-05 ENCOUNTER — Other Ambulatory Visit: Payer: Self-pay

## 2023-11-05 DIAGNOSIS — E119 Type 2 diabetes mellitus without complications: Secondary | ICD-10-CM

## 2023-11-05 MED ORDER — EMPAGLIFLOZIN 25 MG PO TABS: 25.0000 mg | ORAL_TABLET | Freq: Every day | ORAL | 3 refills | Status: DC

## 2023-11-05 NOTE — Addendum Note (Signed)
 Addended by: Carola Rhine on: 11/05/2023 11:51 AM   Modules accepted: Orders

## 2023-11-10 ENCOUNTER — Other Ambulatory Visit: Payer: Self-pay

## 2023-11-10 MED ORDER — EMPAGLIFLOZIN 25 MG PO TABS: 25.0000 mg | ORAL_TABLET | Freq: Every day | ORAL | 3 refills | Status: DC

## 2023-11-16 ENCOUNTER — Other Ambulatory Visit: Payer: Self-pay | Admitting: Family Medicine

## 2023-11-18 ENCOUNTER — Ambulatory Visit: Payer: Medicaid Other | Admitting: Gastroenterology

## 2023-11-18 ENCOUNTER — Encounter: Payer: Self-pay | Admitting: Gastroenterology

## 2023-11-18 VITALS — BP 144/86 | HR 83 | Ht 69.5 in | Wt 221.8 lb

## 2023-11-18 DIAGNOSIS — K219 Gastro-esophageal reflux disease without esophagitis: Secondary | ICD-10-CM | POA: Diagnosis not present

## 2023-11-18 DIAGNOSIS — K581 Irritable bowel syndrome with constipation: Secondary | ICD-10-CM | POA: Diagnosis not present

## 2023-11-18 DIAGNOSIS — K5909 Other constipation: Secondary | ICD-10-CM | POA: Diagnosis not present

## 2023-11-18 DIAGNOSIS — Z1211 Encounter for screening for malignant neoplasm of colon: Secondary | ICD-10-CM

## 2023-11-18 MED ORDER — SUFLAVE 178.7 G PO SOLR: 1.0000 | Freq: Once | ORAL | 0 refills | Status: AC

## 2023-11-18 MED ORDER — LUBIPROSTONE 8 MCG PO CAPS
8.0000 ug | ORAL_CAPSULE | Freq: Two times a day (BID) | ORAL | 3 refills | Status: AC
Start: 2023-11-18 — End: ?

## 2023-11-18 NOTE — Progress Notes (Unsigned)
 HPI :     2-3days between BM Take MoM Takes OTC stool softener every other day. If he takes it every, constipation   Takes Metamucil daily; made constipation worse.  MiraLax didn't help.  Straining.  Stools usually hard.  Rare diarrhea.  Tried probiotics, no change.  No blood.  Excessive gas.  Takes Gas X, little improvement. No abdominal pain.  Ozempic x 6months, no significant change in symptoms.  No fam hx CRC Father had indigestion  Senna made him go   Past Medical History:  Diagnosis Date   Alcohol abuse    Anxiety    Chronic neck pain    sees Dr. Sadie Haber (Ortho). He has bulging discs at C5-6 and at C6-7   Depression    sees Dr. Theodoro Kos   Diabetes mellitus without complication (HCC)    Headache(784.0)    Hypertension    Obesity 10/24/2012     Past Surgical History:  Procedure Laterality Date   LAPAROSCOPIC APPENDECTOMY N/A 07/31/2020   Procedure: APPENDECTOMY LAPAROSCOPIC;  Surgeon: Manus Rudd, MD;  Location: [facility];  Service: General;  Laterality: N/A;   Family History  Problem Relation Age of Onset   Hypertension Other    Cancer Other        lung   Depression Other    Social History   Tobacco Use   Smoking status: Every Day    Current packs/day: 1.00    Types: Cigarettes   Smokeless tobacco: Never  Substance Use Topics   Alcohol use: Yes    Comment: 4-5 beers once per week   Drug use: No   Current Outpatient Medications  Medication Sig Dispense Refill   acetaminophen (TYLENOL) 500 MG tablet Take 1-2 tablets (500-1,000 mg total) by mouth every 6 (six) hours as needed for mild pain. 30 tablet 0   amoxicillin (AMOXIL) 500 MG capsule Take 500 mg by mouth 3 (three) times daily. (Patient not taking: Reported on 11/18/2023)     atorvastatin (LIPITOR) 20 MG tablet TAKE ONE TABLET BY MOUTH ONE TIME DAILY 90 tablet 3   chlorhexidine (PERIDEX) 0.12 % solution  (Patient not taking: Reported on 11/18/2023)     DULoxetine  (CYMBALTA) 30 MG capsule Take 2 capsules (60 mg total) by mouth 2 (two) times daily. 120 capsule 3   empagliflozin (JARDIANCE) 25 MG TABS tablet Take 1 tablet (25 mg total) by mouth daily before breakfast. 90 tablet 3   fish oil-omega-3 fatty acids 1000 MG capsule Take 2 g by mouth daily.     glyBURIDE micronized (GLYNASE) 1.5 MG tablet TAKE ONE-HALF TABLET BY MOUTH TWICE A DAY WITH A MEAL 90 tablet 3   HYDROcodone-acetaminophen (NORCO) 7.5-325 MG tablet Take 1 tablet by mouth 3 (three) times daily as needed.     ibuprofen (ADVIL) 800 MG tablet Take 800 mg by mouth every 8 (eight) hours as needed.     losartan (COZAAR) 50 MG tablet Take 1 tablet (50 mg total) by mouth daily. 90 tablet 3   omeprazole (PRILOSEC) 20 MG capsule Take 20 mg by mouth in the morning and at bedtime. Taking once a day but will increase to BID if needed.     Semaglutide, 1 MG/DOSE, (OZEMPIC, 1 MG/DOSE,) 4 MG/3ML SOPN INJECT 1MG  UNDER THE SKIN EVERY WEEK 3 mL 5   No current facility-administered medications for this visit.   Allergies  Allergen Reactions   Lisinopril Cough   Metformin And Related Other (See Comments)    Abdominal  cramps      Review of Systems: All systems reviewed and negative except where noted in HPI.    No results found.  Physical Exam: Ht 5' 9.5" (1.765 m)   Wt 221 lb 12.8 oz (100.6 kg)   BMI 32.28 kg/m  Constitutional: Pleasant,well-developed, ***male in no acute distress. HEENT: Normocephalic and atraumatic. Conjunctivae are normal. No scleral icterus. Neck supple.  Cardiovascular: Normal rate, regular rhythm.  Pulmonary/chest: Effort normal and breath sounds normal. No wheezing, rales or rhonchi. Abdominal: Soft, nondistended, nontender. Bowel sounds active throughout. There are no masses palpable. No hepatomegaly. Extremities: no edema Lymphadenopathy: No cervical adenopathy noted. Neurological: Alert and oriented to person place and time. Skin: Skin is warm and dry. No rashes  noted. Psychiatric: Normal mood and affect. Behavior is normal.  CBC    Component Value Date/Time   WBC 8.1 06/23/2023 1023   RBC 4.47 06/23/2023 1023   HGB 14.2 06/23/2023 1023   HGB 16.0 03/01/2013 1557   HCT 43.7 06/23/2023 1023   HCT 46.0 03/01/2013 1557   PLT 184.0 06/23/2023 1023   PLT 153 03/01/2013 1557   MCV 97.8 06/23/2023 1023   MCV 97 03/01/2013 1557   MCH 34.4 (H) 07/30/2020 1954   MCHC 32.5 06/23/2023 1023   RDW 13.5 06/23/2023 1023   RDW 13.1 03/01/2013 1557   LYMPHSABS 1.9 06/23/2023 1023   MONOABS 0.5 06/23/2023 1023   EOSABS 0.2 06/23/2023 1023   BASOSABS 0.0 06/23/2023 1023    CMP     Component Value Date/Time   NA 135 06/23/2023 1023   NA 138 03/01/2013 1557   K 3.9 06/23/2023 1023   K 3.9 03/01/2013 1557   CL 99 06/23/2023 1023   CL 105 03/01/2013 1557   CO2 28 06/23/2023 1023   CO2 25 03/01/2013 1557   GLUCOSE 299 (H) 06/23/2023 1023   GLUCOSE 135 (H) 03/01/2013 1557   GLUCOSE 120 (H) 06/30/2006 1154   BUN 12 06/23/2023 1023   BUN 10 03/01/2013 1557   CREATININE 0.69 06/23/2023 1023   CREATININE 0.65 03/01/2013 1557   CALCIUM 9.5 06/23/2023 1023   CALCIUM 9.5 03/01/2013 1557   PROT 7.0 06/23/2023 1023   PROT 7.6 03/01/2013 1557   ALBUMIN 4.1 06/23/2023 1023   ALBUMIN 3.8 03/01/2013 1557   AST 15 06/23/2023 1023   AST 23 03/01/2013 1557   ALT 19 06/23/2023 1023   ALT 45 03/01/2013 1557   ALKPHOS 88 06/23/2023 1023   ALKPHOS 98 03/01/2013 1557   BILITOT 0.5 06/23/2023 1023   BILITOT 0.5 03/01/2013 1557   GFRNONAA >60 07/30/2020 1954   GFRNONAA >60 03/01/2013 1557   GFRAA >60 03/01/2013 1557       Latest Ref Rng & Units 06/23/2023   10:23 AM 06/19/2022   10:50 AM 03/15/2021    8:30 AM  CBC EXTENDED  WBC 4.0 - 10.5 K/uL 8.1  6.5  5.8   RBC 4.22 - 5.81 Mil/uL 4.47  4.26  4.68   Hemoglobin 13.0 - 17.0 g/dL 16.1  09.6  04.5   HCT 39.0 - 52.0 % 43.7  42.4  48.0   Platelets 150.0 - 400.0 K/uL 184.0  153.0  165.0   NEUT# 1.4 - 7.7  K/uL 5.4  3.9  3.3   Lymph# 0.7 - 4.0 K/uL 1.9  1.9  1.8       ASSESSMENT AND PLAN:  Nelwyn Salisbury, MD

## 2023-11-18 NOTE — Patient Instructions (Signed)
 We have sent the following medications to your pharmacy for you to pick up at your convenience: Amitiza 8 mcg twice daily with food.  You have been scheduled for a colonoscopy. Please follow written instructions given to you at your visit today.   If you use inhalers (even only as needed), please bring them with you on the day of your procedure.  DO NOT TAKE 7 DAYS PRIOR TO TEST- Trulicity (dulaglutide) Ozempic, Wegovy (semaglutide) Mounjaro (tirzepatide) Bydureon Bcise (exanatide extended release)  DO NOT TAKE 1 DAY PRIOR TO YOUR TEST Rybelsus (semaglutide) Adlyxin (lixisenatide) Victoza (liraglutide) Byetta (exanatide) ___________________________________________________________________________  Bonita Quin will receive your bowel preparation through [facility], which ensures the lowest copay and home delivery, with outreach via text or call from an [phone] number. Please respond promptly to avoid rescheduling of your procedure. If you are interested in alternative options or have any questions regarding your prep, please contact them at [phone] ____________________________________________________________________________  Your Provider Has Sent Your Bowel Prep Regimen To [facility]   [facility] will contact you to verify your information and collect your copay, if applicable. Enjoy the comfort of your home while your prescription is mailed to you, FREE of any shipping charges.   [facility] accepts all major insurance benefits and applies discounts & coupons.  Have additional questions?   Chat: [email or web address] Call: [phone] Email: [email or web address] NCPDP: [id number]  How will [facility] contact you?  With a Welcome phone call,  a Welcome text and a checkout link in text form.  Texts you receive from [phone] Are NOT Spam.  *To set up delivery, you must complete the checkout process via link or speak to one of the patient care representatives. If [facility]  is unable to reach you, your prescription may be delayed.  To avoid long hold times on the phone, you may also utilize the secure chat feature on the [facility] website to request that they call you back for transaction completion or to expedite your concerns.  _______________________________________________________  If your blood pressure at your visit was 140/90 or greater, please contact your primary care physician to follow up on this.  _______________________________________________________  If you are age 41 or older, your body mass index should be between 23-30. Your Body mass index is 32.28 kg/m. If this is out of the aforementioned range listed, please consider follow up with your Primary Care Provider.  If you are age 67 or younger, your body mass index should be between 19-25. Your Body mass index is 32.28 kg/m. If this is out of the aformentioned range listed, please consider follow up with your Primary Care Provider.   ________________________________________________________  The [facility] providers would like to encourage you to use [facility] to communicate with providers for non-urgent requests or questions.  Due to long hold times on the telephone, sending your provider a message by [facility] may be a faster and more efficient way to get a response.  Please allow 48 business hours for a response.  Please remember that this is for non-urgent requests.  _______________________________________________________
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
## 2023-11-28 ENCOUNTER — Encounter (HOSPITAL_COMMUNITY): Payer: Self-pay | Admitting: Psychiatry

## 2023-11-28 ENCOUNTER — Telehealth (HOSPITAL_COMMUNITY): Payer: Medicaid Other | Admitting: Psychiatry

## 2023-11-28 DIAGNOSIS — F411 Generalized anxiety disorder: Secondary | ICD-10-CM | POA: Diagnosis not present

## 2023-11-28 DIAGNOSIS — F3340 Major depressive disorder, recurrent, in remission, unspecified: Secondary | ICD-10-CM | POA: Diagnosis not present

## 2023-11-28 DIAGNOSIS — F17210 Nicotine dependence, cigarettes, uncomplicated: Secondary | ICD-10-CM | POA: Diagnosis not present

## 2023-11-28 DIAGNOSIS — F410 Panic disorder [episodic paroxysmal anxiety] without agoraphobia: Secondary | ICD-10-CM

## 2023-11-28 DIAGNOSIS — F17200 Nicotine dependence, unspecified, uncomplicated: Secondary | ICD-10-CM

## 2023-11-28 MED ORDER — DULOXETINE HCL 30 MG PO CPEP: 60.0000 mg | ORAL_CAPSULE | Freq: Two times a day (BID) | ORAL | 3 refills | Status: DC

## 2023-11-28 NOTE — Patient Instructions (Signed)

## 2023-12-10 ENCOUNTER — Telehealth: Payer: Self-pay | Admitting: Family Medicine

## 2023-12-10 NOTE — Telephone Encounter (Signed)
 Copied from CRM [id number]. Topic: Clinical - Medication Question >> Dec 10, 2023  2:40 PM [place] J wrote: Reason for CRM: Patient wanted to ask if Dr. Alyne Babinski wanted to up his Semaglutide, 1 MG/DOSE, (OZEMPIC, 1 MG/DOSE,) 4 MG/3ML SOPN to 2MG  instead.

## 2023-12-10 NOTE — Telephone Encounter (Signed)
 Copied from CRM [id number]. Topic: Clinical - Medication Refill >> Dec 10, 2023  2:37 PM [place] J wrote: Most Recent Primary Care Visit:  Provider: [facility]  Department: [facility]  Visit Type: LAB VISIT  Date: 11/03/2023  Medication: amoxicillin (AMOXIL) 500 MG  Has the patient contacted their pharmacy? No (Agent: If no, request that the patient contact the pharmacy for the refill. If patient does not wish to contact the pharmacy document the reason why and proceed with request.) (Agent: If yes, when and what did the pharmacy advise?)  Is this the correct pharmacy for this prescription? Yes If no, delete pharmacy and type the correct one.  This is the patient's preferred pharmacy:  [facility] [address] - [place], [place] - [address] [place] [address] Phone: [phone] Fax: [phone]   Has the prescription been filled recently? No  Is the patient out of the medication? Yes  Has the patient been seen for an appointment in the last year OR does the patient have an upcoming appointment? Yes  Can we respond through MyChart? Yes  Agent: Please be advised that Rx refills may take up to 3 business days. We ask that you follow-up with your pharmacy.

## 2023-12-11 DIAGNOSIS — Z20822 Contact with and (suspected) exposure to covid-19: Secondary | ICD-10-CM | POA: Diagnosis not present

## 2023-12-11 DIAGNOSIS — Z113 Encounter for screening for infections with a predominantly sexual mode of transmission: Secondary | ICD-10-CM | POA: Diagnosis not present

## 2023-12-11 DIAGNOSIS — M7918 Myalgia, other site: Secondary | ICD-10-CM | POA: Diagnosis not present

## 2023-12-11 DIAGNOSIS — Z7252 High risk homosexual behavior: Secondary | ICD-10-CM | POA: Diagnosis not present

## 2023-12-11 NOTE — Telephone Encounter (Signed)
 Received rx request for antibiotic. Called patient for triage due to antibiotic request. Patient states he has no new symptoms this is a chronic recurring abscess/boil that flairs approximately every 3-4 months, he has found that amox/clav-Augmentin is most effective. Affected area is left sided neck, same as always. No fever, or difficulty swallowing or breathing. Last visit with Dr. Alyne Babinski was on 06/23/23. Please advise if rx for Augmentin can be sent or if you would like follow up visit.

## 2023-12-19 MED ORDER — AMOXICILLIN-POT CLAVULANATE 875-125 MG PO TABS: 1.0000 | ORAL_TABLET | Freq: Two times a day (BID) | ORAL | 1 refills | Status: AC

## 2023-12-19 MED ORDER — OZEMPIC (2 MG/DOSE) 8 MG/3ML ~~LOC~~ SOPN: 2.0000 mg | PEN_INJECTOR | SUBCUTANEOUS | 5 refills | Status: DC

## 2023-12-19 NOTE — Telephone Encounter (Signed)
 I sent in the Augmentin  and I increased the Ozempic  to 2 mg weekly

## 2023-12-19 NOTE — Telephone Encounter (Signed)
 Done

## 2023-12-19 NOTE — Addendum Note (Signed)
 Addended by: Corita Diego A on: 12/19/2023 08:36 AM   Modules accepted: Orders

## 2023-12-21 DIAGNOSIS — M2141 Flat foot [pes planus] (acquired), right foot: Secondary | ICD-10-CM | POA: Diagnosis not present

## 2023-12-23 ENCOUNTER — Encounter: Admitting: Gastroenterology

## 2023-12-30 ENCOUNTER — Encounter: Payer: Self-pay | Admitting: Gastroenterology

## 2023-12-30 ENCOUNTER — Ambulatory Visit: Admitting: Gastroenterology

## 2023-12-30 VITALS — BP 139/80 | HR 83 | Temp 98.2°F | Resp 14 | Wt 221.0 lb

## 2023-12-30 DIAGNOSIS — D125 Benign neoplasm of sigmoid colon: Secondary | ICD-10-CM | POA: Diagnosis not present

## 2023-12-30 DIAGNOSIS — K5730 Diverticulosis of large intestine without perforation or abscess without bleeding: Secondary | ICD-10-CM

## 2023-12-30 DIAGNOSIS — E119 Type 2 diabetes mellitus without complications: Secondary | ICD-10-CM | POA: Diagnosis not present

## 2023-12-30 DIAGNOSIS — I10 Essential (primary) hypertension: Secondary | ICD-10-CM | POA: Diagnosis not present

## 2023-12-30 DIAGNOSIS — K581 Irritable bowel syndrome with constipation: Secondary | ICD-10-CM

## 2023-12-30 DIAGNOSIS — Z1211 Encounter for screening for malignant neoplasm of colon: Secondary | ICD-10-CM | POA: Diagnosis not present

## 2023-12-30 DIAGNOSIS — F419 Anxiety disorder, unspecified: Secondary | ICD-10-CM | POA: Diagnosis not present

## 2023-12-30 DIAGNOSIS — E669 Obesity, unspecified: Secondary | ICD-10-CM | POA: Diagnosis not present

## 2023-12-30 DIAGNOSIS — F32A Depression, unspecified: Secondary | ICD-10-CM | POA: Diagnosis not present

## 2023-12-30 DIAGNOSIS — K2950 Unspecified chronic gastritis without bleeding: Secondary | ICD-10-CM | POA: Diagnosis not present

## 2023-12-30 DIAGNOSIS — K317 Polyp of stomach and duodenum: Secondary | ICD-10-CM | POA: Diagnosis not present

## 2023-12-30 MED ORDER — SODIUM CHLORIDE 0.9 % IV SOLN: 500.0000 mL | Freq: Once | INTRAVENOUS | Status: DC

## 2023-12-30 NOTE — Progress Notes (Signed)
 Called to room to assist during endoscopic procedure.  Patient ID and intended procedure confirmed with present staff. Received instructions for my participation in the procedure from the performing physician.

## 2023-12-30 NOTE — Progress Notes (Signed)
 A/o x 3, VSS, gd SR's, pleased with anesthesia, report to RN

## 2023-12-30 NOTE — Patient Instructions (Signed)

## 2023-12-30 NOTE — Op Note (Signed)
 [facility] Patient Name: [name] [name] Procedure Date: 12/30/2023 2:50 PM MRN: [id number] Endoscopist: Geralyn Knee E. Cherryl Corona , MD, [id number] Age: 48 Referring MD:  Date of Birth: April 24, 1976 Gender: Male Account #: [id number] Procedure:                Colonoscopy Indications:              Screening for colorectal malignant neoplasm, This                            is the patient's first colonoscopy Medicines:                Monitored Anesthesia Care Procedure:                Pre-Anesthesia Assessment:                           - Prior to the procedure, a History and Physical                            was performed, and patient medications and                            allergies were reviewed. The patient's tolerance of                            previous anesthesia was also reviewed. The risks                            and benefits of the procedure and the sedation                            options and risks were discussed with the patient.                            All questions were answered, and informed consent                            was obtained. Prior Anticoagulants: The patient has                            taken no anticoagulant or antiplatelet agents. ASA                            Grade Assessment: III - A patient with severe                            systemic disease. After reviewing the risks and                            benefits, the patient was deemed in satisfactory                            condition to undergo the procedure.  After obtaining informed consent, the colonoscope                            was passed under direct vision. Throughout the                            procedure, the patient's blood pressure, pulse, and                            oxygen saturations were monitored continuously. The                            Olympus Scope [id number] was introduced through the                            anus and  advanced to the the cecum, identified by                            appendiceal orifice and ileocecal valve. The                            colonoscopy was performed without difficulty. The                            patient tolerated the procedure well. The quality                            of the bowel preparation was fair in the ascending                            colon and the cecum. There was a copious amount of                            adherent liquid stool that would not completely                            rinse from the colon wall. The ileocecal valve,                            appendiceal orifice, and rectum were photographed.                            The bowel preparation used was SUFLAVE  via split                            dose instruction. Scope In: 3:20:41 PM Scope Out: 3:36:29 PM Scope Withdrawal Time: 0 hours 10 minutes 33 seconds  Total Procedure Duration: 0 hours 15 minutes 48 seconds  Findings:                 The perianal and digital rectal examinations were                            normal. Pertinent negatives include  normal                            sphincter tone and no palpable rectal lesions.                           A 4 mm polyp was found in the sigmoid colon. The                            polyp was sessile. The polyp was removed with a                            cold snare. Resection and retrieval were complete.                            Estimated blood loss was minimal.                           Multiple medium-mouthed and small-mouthed                            diverticula were found in the sigmoid colon and                            descending colon.                           The exam was otherwise normal throughout the                            examined colon.                           The retroflexed view of the distal rectum and anal                            verge was normal and showed no anal or rectal                             abnormalities. Complications:            No immediate complications. Estimated Blood Loss:     Estimated blood loss was minimal. Impression:               - Preparation of the colon was fair.                           - One 4 mm polyp in the sigmoid colon, removed with                            a cold snare. Resected and retrieved.                           - Moderate diverticulosis in the sigmoid colon and  in the descending colon.                           - The distal rectum and anal verge are normal on                            retroflexion view. Recommendation:           - Patient has a contact number available for                            emergencies. The signs and symptoms of potential                            delayed complications were discussed with the                            patient. Return to normal activities tomorrow.                            Written discharge instructions were provided to the                            patient.                           - Resume previous diet.                           - Continue present medications.                           - Await pathology results.                           - Repeat colonoscopy in 3 years because the bowel                            preparation was suboptimal and for surveillance. Cameshia Cressman E. Cherryl Corona, MD 12/30/2023 3:41:30 PM This report has been signed electronically.

## 2023-12-30 NOTE — Progress Notes (Signed)
 Pt's states no medical or surgical changes since previsit or office visit.

## 2023-12-30 NOTE — Progress Notes (Signed)
 [facility] History and Physical   Primary Care Physician:  Donley Furth, MD   Reason for Procedure:   Colon cancer screening  Plan:    Screening colonoscopy     HPI: [name] [name] is a 48 y.o. male undergoing initial average risk screening colonoscopy.  He has no family history of colon cancer.  He has chronic constipation and excessive gas/bloating.    Past Medical History:  Diagnosis Date   Alcohol abuse    Anxiety    Arthritis    Chronic neck pain    sees Dr. Jodeane Mulligan (Ortho). 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 Ulysess Gang   Diabetes mellitus without complication (HCC)    Headache(784.0)    Hyperlipidemia    Hypertension    Obesity 10/24/2012    Past Surgical History:  Procedure Laterality Date   LAPAROSCOPIC APPENDECTOMY N/A 07/31/2020   Procedure: APPENDECTOMY LAPAROSCOPIC;  Surgeon: Dareen Ebbing, MD;  Location: [facility] OR;  Service: General;  Laterality: N/A;   NECK SURGERY      Prior to Admission medications   Medication Sig Start Date End Date Taking? Authorizing Provider  acetaminophen  (TYLENOL ) 500 MG tablet Take 1-2 tablets (500-1,000 mg total) by mouth every 6 (six) hours as needed for mild pain. 07/31/20  Yes Meuth, Brooke A, PA-C  atorvastatin  (LIPITOR) 20 MG tablet TAKE ONE TABLET BY MOUTH ONE TIME DAILY 04/15/23  Yes Donley Furth, MD  DULoxetine  (CYMBALTA ) 30 MG capsule Take 2 capsules (60 mg total) by mouth 2 (two) times daily. 11/28/23 03/27/24 Yes Bahraini, Sarah A  fish oil-omega-3 fatty acids 1000 MG capsule Take 2 g by mouth daily.   Yes [provider]  glyBURIDE  micronized (GLYNASE ) 1.5 MG tablet TAKE ONE-HALF TABLET BY MOUTH TWICE A DAY WITH A MEAL 04/15/23  Yes Donley Furth, MD  losartan  (COZAAR ) 50 MG tablet TAKE ONE TABLET BY MOUTH ONE TIME DAILY 11/18/23  Yes Donley Furth, MD  meloxicam (MOBIC) 15 MG tablet Take 1 tablet every day by oral route. 12/21/23  Yes  [provider]  omeprazole (PRILOSEC) 20 MG capsule Take 20 mg by mouth in the morning and at bedtime. Taking once a day but will increase to BID if needed. 11/17/23  Yes [provider]  amoxicillin -clavulanate (AUGMENTIN ) 875-125 MG tablet Take 1 tablet by mouth 2 (two) times daily. 12/19/23   Donley Furth, MD  chlorhexidine  (PERIDEX ) 0.12 % solution  11/18/23   [provider]  empagliflozin  (JARDIANCE ) 25 MG TABS tablet Take 1 tablet (25 mg total) by mouth daily before breakfast. 11/10/23   Donley Furth, MD  HYDROcodone-acetaminophen  [facility]) 7.5-325 MG tablet Take 1 tablet by mouth 3 (three) times daily as needed. Patient not taking: Reported on 12/30/2023 11/18/23   [provider]  ibuprofen (ADVIL) 800 MG tablet Take 800 mg by mouth every 8 (eight) hours as needed. Patient not taking: Reported on 12/30/2023 11/18/23   [provider]  lubiprostone  (AMITIZA ) 8 MCG capsule Take 1 capsule (8 mcg total) by mouth 2 (two) times daily with a meal. 11/18/23   Elois Hair, MD  Semaglutide , 2 MG/DOSE, (OZEMPIC , 2 MG/DOSE,) 8 MG/3ML SOPN Inject 2 mg into the skin once a week. 12/19/23   Donley Furth, MD    Current Outpatient Medications  Medication Sig Dispense Refill   acetaminophen  (TYLENOL ) 500 MG tablet Take 1-2 tablets (500-1,000  mg total) by mouth every 6 (six) hours as needed for mild pain. 30 tablet 0   atorvastatin  (LIPITOR) 20 MG tablet TAKE ONE TABLET BY MOUTH ONE TIME DAILY 90 tablet 3   DULoxetine  (CYMBALTA ) 30 MG capsule Take 2 capsules (60 mg total) by mouth 2 (two) times daily. 120 capsule 3   fish oil-omega-3 fatty acids 1000 MG capsule Take 2 g by mouth daily.     glyBURIDE  micronized (GLYNASE ) 1.5 MG tablet TAKE ONE-HALF TABLET BY MOUTH TWICE A DAY WITH A MEAL 90 tablet 3   losartan  (COZAAR ) 50 MG tablet TAKE ONE TABLET BY MOUTH ONE TIME DAILY 90 tablet 3   meloxicam (MOBIC) 15 MG tablet Take 1 tablet every day by oral route.      omeprazole (PRILOSEC) 20 MG capsule Take 20 mg by mouth in the morning and at bedtime. Taking once a day but will increase to BID if needed.     amoxicillin -clavulanate (AUGMENTIN ) 875-125 MG tablet Take 1 tablet by mouth 2 (two) times daily. 20 tablet 1   chlorhexidine  (PERIDEX ) 0.12 % solution  (Patient not taking: Reported on 12/30/2023)     empagliflozin  (JARDIANCE ) 25 MG TABS tablet Take 1 tablet (25 mg total) by mouth daily before breakfast. 90 tablet 3   HYDROcodone-acetaminophen  (NORCO) 7.5-325 MG tablet Take 1 tablet by mouth 3 (three) times daily as needed. (Patient not taking: Reported on 12/30/2023)     ibuprofen (ADVIL) 800 MG tablet Take 800 mg by mouth every 8 (eight) hours as needed. (Patient not taking: Reported on 12/30/2023)     lubiprostone  (AMITIZA ) 8 MCG capsule Take 1 capsule (8 mcg total) by mouth 2 (two) times daily with a meal. 180 capsule 3   Semaglutide , 2 MG/DOSE, (OZEMPIC , 2 MG/DOSE,) 8 MG/3ML SOPN Inject 2 mg into the skin once a week. 3 mL 5   Current Facility-Administered Medications  Medication Dose Route Frequency Provider Last Rate Last Admin   0.9 %  sodium chloride  infusion  500 mL Intravenous Once Elois Hair, MD        Allergies as of 12/30/2023 - Review Complete 12/30/2023  Allergen Reaction Noted   Lisinopril  Cough 11/25/2022   Metformin  and related Other (See Comments) 03/15/2021    Family History  Problem Relation Age of Onset   Diabetes Father    Diabetes Maternal Grandmother    Diabetes Maternal Grandfather    Diabetes Paternal Grandmother    Diabetes Paternal Grandfather    Hypertension Other    Cancer Other        lung   Depression Other    Colon cancer Neg Hx    Colon polyps Neg Hx    Esophageal cancer Neg Hx    Rectal cancer Neg Hx    Stomach cancer Neg Hx     Social History   Socioeconomic History   Marital status: Single    Spouse name: Not on file   Number of children: 0   Years of education: Not on file   Highest  education level: 12th grade  Occupational History   Not on file  Tobacco Use   Smoking status: Every Day    Current packs/day: 1.00    Types: Cigarettes   Smokeless tobacco: Never  Vaping Use   Vaping status: Never Used  Substance and Sexual Activity   Alcohol use: Yes    Comment: 4-5 beers once per week   Drug use: No   Sexual activity: Not on file  Other  Topics Concern   Not on file  Social History Narrative   Not on file   Social Drivers of Health   Financial Resource Strain: Low Risk  (06/22/2023)   Overall Financial Resource Strain (CARDIA)    Difficulty of Paying Living Expenses: Not very hard  Food Insecurity: No Food Insecurity (06/22/2023)   Hunger Vital Sign    Worried About Running Out of Food in the Last Year: Never true    Ran Out of Food in the Last Year: Never true  Transportation Needs: No Transportation Needs (06/22/2023)   PRAPARE - Administrator, Civil Service (Medical): No    Lack of Transportation (Non-Medical): No  Physical Activity: Sufficiently Active (06/22/2023)   Exercise Vital Sign    Days of Exercise per Week: 3 days    Minutes of Exercise per Session: 90 min  Stress: Stress Concern Present (06/22/2023)   [employer] of Occupational Health - Occupational Stress Questionnaire    Feeling of Stress : To some extent  Social Connections: Socially Isolated (06/22/2023)   Social Connection and Isolation Panel [NHANES]    Frequency of Communication with Friends and Family: More than three times a week    Frequency of Social Gatherings with Friends and Family: More than three times a week    Attends Religious Services: Never    Database administrator or Organizations: No    Attends Engineer, structural: Not on file    Marital Status: Separated  Intimate Partner Violence: Not on file    Review of Systems:  All other review of systems negative except as mentioned in the HPI.  Physical Exam: Vital signs BP 133/86    Pulse 88   Temp 98.2 F (36.8 C)   Resp 10   Wt 221 lb (100.2 kg)   SpO2 99%   BMI 32.17 kg/m   General:   Alert,  Well-developed, well-nourished, pleasant and cooperative in NAD Airway:  Mallampati 2 Lungs:  Clear throughout to auscultation.   Heart:  Regular rate and rhythm; no murmurs, clicks, rubs,  or gallops. Abdomen:  Soft, nontender and nondistended. Normal bowel sounds.   Neuro/Psych:  Normal mood and affect. A and O x 3   Nazyia Gaugh E. Cherryl Corona, MD [facility]

## 2023-12-31 ENCOUNTER — Telehealth: Payer: Self-pay | Admitting: *Deleted

## 2023-12-31 NOTE — Telephone Encounter (Signed)
  Follow up Call-     12/30/2023    1:50 PM  Call back number  Post procedure Call Back phone  # [phone]  Permission to leave phone message Yes     Patient questions:  Do you have a fever, pain , or abdominal swelling? No. Pain Score  0 *  Have you tolerated food without any problems? Yes.    Have you been able to return to your normal activities? Yes.    Do you have any questions about your discharge instructions: Diet   No. Medications  No. Follow up visit  No.  Do you have questions or concerns about your Care? No.  Actions: * If pain score is 4 or above: No action needed, pain <4.

## 2024-01-02 LAB — SURGICAL PATHOLOGY

## 2024-01-06 ENCOUNTER — Encounter: Payer: Self-pay | Admitting: Gastroenterology

## 2024-01-06 DIAGNOSIS — E119 Type 2 diabetes mellitus without complications: Secondary | ICD-10-CM

## 2024-01-06 DIAGNOSIS — M2141 Flat foot [pes planus] (acquired), right foot: Secondary | ICD-10-CM | POA: Diagnosis not present

## 2024-01-06 DIAGNOSIS — M25571 Pain in right ankle and joints of right foot: Secondary | ICD-10-CM | POA: Diagnosis not present

## 2024-01-06 DIAGNOSIS — M79671 Pain in right foot: Secondary | ICD-10-CM | POA: Diagnosis not present

## 2024-01-06 NOTE — Progress Notes (Signed)
 [name] [name],  The polyp which I removed during your recent procedure was proven to be completely benign but is considered a "pre-cancerous" polyp that MAY have grown into cancer if it had not been removed.  Studies shows that at least 20% of women over age 47 and 30% of men over age 45 have pre-cancerous polyps.  Based on the suboptimal quality of the bowel preparation in the right colon, I recommend you repeat a colonoscopy in 3 years.  I would recommend additional bowel prep measures for your next colonoscopy to maximize the likelihood of a good prep.  This could include a 2-day prep or using extra laxatives in the days leading up to the procedure.  If you develop any new rectal bleeding, abdominal pain or significant bowel habit changes, please contact me before then.

## 2024-01-12 DIAGNOSIS — M79671 Pain in right foot: Secondary | ICD-10-CM | POA: Diagnosis not present

## 2024-01-12 DIAGNOSIS — M25571 Pain in right ankle and joints of right foot: Secondary | ICD-10-CM | POA: Diagnosis not present

## 2024-01-12 DIAGNOSIS — M216X1 Other acquired deformities of right foot: Secondary | ICD-10-CM | POA: Diagnosis not present

## 2024-01-12 DIAGNOSIS — M21072 Valgus deformity, not elsewhere classified, left ankle: Secondary | ICD-10-CM | POA: Diagnosis not present

## 2024-01-12 DIAGNOSIS — M2142 Flat foot [pes planus] (acquired), left foot: Secondary | ICD-10-CM | POA: Diagnosis not present

## 2024-01-12 DIAGNOSIS — M2141 Flat foot [pes planus] (acquired), right foot: Secondary | ICD-10-CM | POA: Diagnosis not present

## 2024-01-12 DIAGNOSIS — E1142 Type 2 diabetes mellitus with diabetic polyneuropathy: Secondary | ICD-10-CM | POA: Diagnosis not present

## 2024-01-12 DIAGNOSIS — M21071 Valgus deformity, not elsewhere classified, right ankle: Secondary | ICD-10-CM | POA: Diagnosis not present

## 2024-01-12 DIAGNOSIS — M19071 Primary osteoarthritis, right ankle and foot: Secondary | ICD-10-CM | POA: Diagnosis not present

## 2024-01-12 DIAGNOSIS — G8929 Other chronic pain: Secondary | ICD-10-CM | POA: Diagnosis not present

## 2024-01-12 DIAGNOSIS — M76821 Posterior tibial tendinitis, right leg: Secondary | ICD-10-CM | POA: Diagnosis not present

## 2024-01-12 DIAGNOSIS — M24571 Contracture, right ankle: Secondary | ICD-10-CM | POA: Diagnosis not present

## 2024-01-16 DIAGNOSIS — M5412 Radiculopathy, cervical region: Secondary | ICD-10-CM | POA: Diagnosis not present

## 2024-01-26 DIAGNOSIS — E119 Type 2 diabetes mellitus without complications: Secondary | ICD-10-CM | POA: Diagnosis not present

## 2024-01-26 DIAGNOSIS — G8929 Other chronic pain: Secondary | ICD-10-CM | POA: Diagnosis not present

## 2024-01-26 DIAGNOSIS — M79671 Pain in right foot: Secondary | ICD-10-CM | POA: Diagnosis not present

## 2024-01-26 DIAGNOSIS — M25571 Pain in right ankle and joints of right foot: Secondary | ICD-10-CM | POA: Diagnosis not present

## 2024-02-02 ENCOUNTER — Other Ambulatory Visit

## 2024-02-02 ENCOUNTER — Other Ambulatory Visit: Payer: Self-pay | Admitting: Family Medicine

## 2024-02-02 ENCOUNTER — Ambulatory Visit: Payer: Self-pay | Admitting: Family Medicine

## 2024-02-02 DIAGNOSIS — E119 Type 2 diabetes mellitus without complications: Secondary | ICD-10-CM

## 2024-02-02 LAB — HEMOGLOBIN A1C: Hgb A1c MFr Bld: 7.5 % — ABNORMAL HIGH (ref 4.6–6.5)

## 2024-02-02 NOTE — Telephone Encounter (Signed)
 Refer to Endocrinology

## 2024-02-13 DIAGNOSIS — S20219A Contusion of unspecified front wall of thorax, initial encounter: Secondary | ICD-10-CM | POA: Diagnosis not present

## 2024-02-13 DIAGNOSIS — R9389 Abnormal findings on diagnostic imaging of other specified body structures: Secondary | ICD-10-CM | POA: Diagnosis not present

## 2024-02-13 DIAGNOSIS — S299XXA Unspecified injury of thorax, initial encounter: Secondary | ICD-10-CM | POA: Diagnosis not present

## 2024-02-13 NOTE — Progress Notes (Signed)
 Patient ID: [name] [name] is a 48 y.o. male.  Allergies[1]  Problem List[2]  Medical History[3]  Chief Complaint  Patient presents with  . Chest Pain    Pt presents with bilateral rib pain. Pt states he fell off a ladder on Monday. He states the fall was approximately 4 ft. Pt states he is unable to move without pain.    History of Present Illness: HPI  Here for evaluation of chest and rib pain.  Patient reports on Monday he was standing on his ladder when his foot slipped causing him to fall forward onto his ladder and the top of the ladder hit him on the chest.  Has had worsening pain since then-mostly on the outside of his pectoral muscles.  Hurts when he takes a deep breath, sneezes or coughs.  Otherwise denies shortness of breath or respiratory symptoms, no hemoptysis, back pain, abdominal pain, nausea or vomiting, dizziness or palpitations.  Nothing tried to improve symptoms. Review of Systems: See HPI  Objective: Vitals:   02/13/24 1648  BP: 145/89  Pulse: 104  Resp: 16  Temp: 97.7 F (36.5 C)  TempSrc: Oral  SpO2: 100%  Weight: 99.8 kg (220 lb)  Height: 1.753 m (5' 9)     No results found for this visit on 02/13/24.     Physical Exam: Physical Exam Vitals and nursing note reviewed.  Constitutional:      Appearance: Normal appearance.  HENT:     Head: Normocephalic and atraumatic.     Right Ear: External ear normal.     Left Ear: External ear normal.     Mouth/Throat:     Mouth: Mucous membranes are moist.     Pharynx: Oropharynx is clear.   Eyes:     Extraocular Movements: Extraocular movements intact.     Conjunctiva/sclera: Conjunctivae normal.    Cardiovascular:     Rate and Rhythm: Normal rate and regular rhythm.  Pulmonary:     Effort: Pulmonary effort is normal. No respiratory distress.     Breath sounds: Normal breath sounds. No stridor.  Chest:     Chest wall: Tenderness present.      Comments: Areas of discomfort.  No midline sternal pain,  xiphoid pain, no tenderness along anterior or mid axillary lines.  No crepitus.  Musculoskeletal:        General: No swelling or deformity. Normal range of motion.     Cervical back: Normal range of motion. No rigidity.   Skin:    General: Skin is warm.     Findings: No rash.   Neurological:     General: No focal deficit present.     Mental Status: He is alert. Mental status is at baseline.     Gait: Gait normal.   Psychiatric:        Mood and Affect: Mood normal.        Behavior: Behavior normal.        Thought Content: Thought content normal.        Judgment: Judgment normal.      Assessment: Cortlin was seen today for chest pain.  Diagnoses and all orders for this visit:  Chest wall injury, initial encounter -     XR Chest 2 Views  Contusion of both sides of front wall of thorax, initial encounter     Plan: Here for anterior chest wall pain after slipping on his ladder on Monday.  Tenderness in pectoralis musculature bilateral with no central or sternal pain.  No other cardiopulmonary complaints.  Chest x-ray obtained in clinic, negative for acute fracture, pneumothorax or other acute process.  Discussed likely hematoma with chest wall contusion, supportive therapies at home, warm compresses and OTC medications.  He is agreeable, appreciative and voices no other questions or concerns.  Otherwise well-appearing and appropriate for outpatient follow-up  Symptomatic management discussed.  Patient was given verbal and written instructions on symptoms that necessitate return to the UC/ED, and instructed to f/u w/ UC or PCP if not improving in expected timeframe.   Patient/parent has been instructed on RX/OTC medications, dosages, side effects, and possible interactions as associated with each diagnosis in my impression and plan above.   Patient education (verbal/handout) given on diagnosis, pathophysiology, treatment of diagnosis, side effects of medication use for treatment,  restrictions while taking medication, supportives measures such as staying hydrated.   Red Flags associated with diagnosis/es were reviewed and patient instructed on action plan if red flags develop.   They have been instructed that if symptoms worsen or red flags develop they should return to Urgent Care, go to the nearest ED, or activate EMS/911.     Patient/parent agreed with plan and voiced understanding.  No barriers to adherence perceived by myself.  During this patient encounter, the patient was wearing a mask. Throughout this encounter, I was wearing at least a surgical mask.  I was not within 6 feet of this patient for more than 15 minutes without eye protection when they were not wearing a mask.    Portions of this note may have been dictated using Engineer, materials.   Electronically signed by @ELECT[employer] @ at 6:00 PM.       [1] Allergies Allergen Reactions  . Metformin  Other (See Comments) and Hives  . Lisinopril  Cough  [2] Patient Active Problem List Diagnosis  . Diabetes    (CMD)  [3] Past Medical History: Diagnosis Date  . Diabetes    (CMD)
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
## 2024-03-05 ENCOUNTER — Telehealth (HOSPITAL_COMMUNITY): Admitting: Psychiatry

## 2024-03-08 ENCOUNTER — Telehealth (INDEPENDENT_AMBULATORY_CARE_PROVIDER_SITE_OTHER): Admitting: Psychiatry

## 2024-03-08 ENCOUNTER — Encounter (HOSPITAL_COMMUNITY): Payer: Self-pay | Admitting: Psychiatry

## 2024-03-08 DIAGNOSIS — F410 Panic disorder [episodic paroxysmal anxiety] without agoraphobia: Secondary | ICD-10-CM | POA: Diagnosis not present

## 2024-03-08 DIAGNOSIS — F3340 Major depressive disorder, recurrent, in remission, unspecified: Secondary | ICD-10-CM | POA: Diagnosis not present

## 2024-03-08 DIAGNOSIS — F411 Generalized anxiety disorder: Secondary | ICD-10-CM

## 2024-03-08 MED ORDER — DULOXETINE HCL 30 MG PO CPEP: 60.0000 mg | ORAL_CAPSULE | Freq: Two times a day (BID) | ORAL | 3 refills | Status: DC

## 2024-03-08 MED ORDER — HYDROXYZINE HCL 10 MG PO TABS: 10.0000 mg | ORAL_TABLET | Freq: Every day | ORAL | 2 refills | Status: DC | PRN

## 2024-03-08 NOTE — Patient Instructions (Signed)
 Thank you for attending your appointment today.  -- START hydroxyzine  10-20 mg daily as needed for panic attacks -- Continue other medications as prescribed.  Please do not make any changes to medications without first discussing with your provider. If you are experiencing a psychiatric emergency, please call 911 or present to your nearest emergency department. Additional crisis, medication management, and therapy resources are included below.  [facility]  [address], [place], [place] [address] [phone] WALK-IN URGENT CARE 24/7 FOR ANYONE [address] [place], [place]  [phone] Fax: [phone] [email or web address] *Interpreters available *Accepts all insurance and uninsured for Urgent Care needs *Accepts Medicaid and uninsured for outpatient treatment (below)      ONLY FOR [facility]  Below:    Outpatient New Patient Assessment/Therapy Walk-ins:        Monday, Wednesday, and Thursday 8am until slots are full (first come, first served)                   New Patient Psychiatry/Medication Management        Monday-Friday 8am-11am (first come, first served)               For all walk-ins we ask that you arrive by 7:15am, because patients will be seen in the order of arrival.

## 2024-03-17 ENCOUNTER — Telehealth (HOSPITAL_COMMUNITY): Payer: Self-pay | Admitting: Psychiatry

## 2024-03-17 NOTE — Telephone Encounter (Signed)
 Patient is looking to have paperwork filled out to certify his [place] bulldog as an emotional support animal.  Patient sees [place].  Thank you.

## 2024-03-17 NOTE — Telephone Encounter (Signed)
 Hello Dr. Mercy do you normally write these letters for your patients ? Please advise how to go about letters for animals regarding future questions as well.     JNL

## 2024-03-17 NOTE — Telephone Encounter (Signed)
 Called patient to let him know that this will be addressed at future appointment with provider. Patient states that he wants a sooner appointment, but transferred him to the front to attempt to see if he could get a sooner APP but noted that he may have to wait for this issue.   JNL

## 2024-03-18 ENCOUNTER — Telehealth (HOSPITAL_COMMUNITY): Payer: Self-pay | Admitting: Psychiatry

## 2024-03-18 ENCOUNTER — Encounter (HOSPITAL_COMMUNITY): Payer: Self-pay

## 2024-03-18 NOTE — Telephone Encounter (Signed)
 This Clinical research associate received message that patient called requesting ESA letter.  Called patient 03/18/24 for approx. 8 min phone call: He states he is looking for letter supporting his dog as ESA as he would like to take his dog on upcoming trip by plane; he also notes he is looking to move but has been denied housing due to his dog. This Clinical research associate reviewed difference between ESA and service animal and that ESAs are not protected by [employer]. He expressed understanding. He remains interested in obtaining ESA letter to assist with ability to maintain housing. Reviewed how his dog alleviates symptoms of his depression, anxiety, and panic attacks and provision of ESA letter was felt to be appropriate. Reviewed that I am not able to comment on training/safety of his dog but solely that he has identified that his dog provides relief of various symptoms of his mental health diagnoses. Letter sent through Mychart.  LAURAINE DELENA PUMMEL, MD 03/18/24

## 2024-03-18 NOTE — Telephone Encounter (Signed)
 Sounds good thank you for clarifying this!  JNL,CMA

## 2024-04-16 DIAGNOSIS — M542 Cervicalgia: Secondary | ICD-10-CM | POA: Diagnosis not present

## 2024-05-06 NOTE — Progress Notes (Signed)
 Patient did not connect for virtual psychiatric medication management appointment on 05/10/24 at 10:30AM. Sent secure video link with no response. Called phone with no answer; left VM with callback number to reschedule.  LAURAINE DELENA PUMMEL, MD 05/10/24

## 2024-05-08 ENCOUNTER — Other Ambulatory Visit: Payer: Self-pay | Admitting: Family Medicine

## 2024-05-10 ENCOUNTER — Encounter (HOSPITAL_COMMUNITY): Admitting: Psychiatry

## 2024-05-10 ENCOUNTER — Encounter (HOSPITAL_COMMUNITY): Payer: Self-pay

## 2024-05-11 ENCOUNTER — Telehealth: Payer: Self-pay | Admitting: *Deleted

## 2024-05-11 DIAGNOSIS — E119 Type 2 diabetes mellitus without complications: Secondary | ICD-10-CM

## 2024-05-11 NOTE — Telephone Encounter (Signed)
 Copied from CRM [id number]. Topic: Clinical - Request for Lab/Test Order >> May 11, 2024  2:14 PM Harlene ORN wrote: Reason for CRM: Patient is calling to requesting orders for A1C

## 2024-05-12 ENCOUNTER — Encounter (HOSPITAL_COMMUNITY): Payer: Self-pay | Admitting: Psychiatry

## 2024-05-12 ENCOUNTER — Telehealth (INDEPENDENT_AMBULATORY_CARE_PROVIDER_SITE_OTHER): Admitting: Psychiatry

## 2024-05-12 DIAGNOSIS — F410 Panic disorder [episodic paroxysmal anxiety] without agoraphobia: Secondary | ICD-10-CM

## 2024-05-12 DIAGNOSIS — F3340 Major depressive disorder, recurrent, in remission, unspecified: Secondary | ICD-10-CM | POA: Diagnosis not present

## 2024-05-12 DIAGNOSIS — F411 Generalized anxiety disorder: Secondary | ICD-10-CM | POA: Diagnosis not present

## 2024-05-12 MED ORDER — DULOXETINE HCL 30 MG PO CPEP: 60.0000 mg | ORAL_CAPSULE | Freq: Two times a day (BID) | ORAL | 3 refills | Status: DC

## 2024-05-12 NOTE — Telephone Encounter (Signed)
 I placed the order for an A1c

## 2024-05-12 NOTE — Telephone Encounter (Signed)
 Spoke with pt states that he has not heard from Endocrinology referral which was placed in 6/25. Pt was provided with [facility] contact number to check on the scheduling. Meanwhile pt wants to come to the lab for A1C check. Last A1C was checked on 6/25. Please advise

## 2024-05-12 NOTE — Patient Instructions (Signed)

## 2024-05-12 NOTE — Telephone Encounter (Signed)
 Pt has been schedule for fasting lab
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
## 2024-05-14 ENCOUNTER — Other Ambulatory Visit (INDEPENDENT_AMBULATORY_CARE_PROVIDER_SITE_OTHER)

## 2024-05-14 ENCOUNTER — Ambulatory Visit: Payer: Self-pay | Admitting: Family Medicine

## 2024-05-14 DIAGNOSIS — E119 Type 2 diabetes mellitus without complications: Secondary | ICD-10-CM

## 2024-05-14 LAB — HEMOGLOBIN A1C: Hgb A1c MFr Bld: 7.1 % — ABNORMAL HIGH (ref 4.6–6.5)

## 2024-06-15 ENCOUNTER — Other Ambulatory Visit

## 2024-06-15 ENCOUNTER — Ambulatory Visit: Admitting: Endocrinology

## 2024-06-15 ENCOUNTER — Encounter: Payer: Self-pay | Admitting: Endocrinology

## 2024-06-15 VITALS — BP 146/80 | HR 71 | Resp 20 | Ht 69.5 in | Wt 219.6 lb

## 2024-06-15 DIAGNOSIS — Z7984 Long term (current) use of oral hypoglycemic drugs: Secondary | ICD-10-CM

## 2024-06-15 DIAGNOSIS — E1169 Type 2 diabetes mellitus with other specified complication: Secondary | ICD-10-CM | POA: Diagnosis not present

## 2024-06-15 DIAGNOSIS — Z7985 Long-term (current) use of injectable non-insulin antidiabetic drugs: Secondary | ICD-10-CM

## 2024-06-15 MED ORDER — TIRZEPATIDE 10 MG/0.5ML ~~LOC~~ SOAJ: 10.0000 mg | SUBCUTANEOUS | 4 refills | Status: DC

## 2024-06-15 MED ORDER — METFORMIN HCL ER 500 MG PO TB24: ORAL_TABLET | ORAL | 4 refills | Status: DC

## 2024-06-15 NOTE — Patient Instructions (Signed)
 Stop Ozempic . Start Mounjaro 10 mg weekly. Start metfomin XR 500 mg 1 tab daily for 1 week and increase to 2 tab daily.  Stop glyburide .

## 2024-06-15 NOTE — Progress Notes (Signed)
 Outpatient Endocrinology Note [name] Ellias Mcelreath, MD   Patient's Name: [name] [name]    DOB: 22-Apr-1976    MRN: [id number]                                                    REASON OF VISIT: New consult for type 2 diabetes mellitus  REFERRING PROVIDER: Johnny Garnette LABOR, MD  PCP: Johnny Garnette LABOR, MD  HISTORY OF PRESENT ILLNESS:   [name] [name] is a 48 y.o. old male with past medical history listed below, is here for new consult for type 2 diabetes mellitus.   Pertinent Diabetes History: Patient is referred to endocrinology for evaluation and management of uncontrolled type 2 diabetes mellitus.  Initial consult on June 15, 2024.  Patient was diagnosed with type 2 diabetes mellitus in 2011.  He has hemoglobin A1c in the range of 7 to 10% range in the past lately diabetes control has been slowly improving.  He has not been on insulin  therapy in the past.  History of DKA or diabetes related hospitalizations: none  Previous diabetes education: Yes   Family h/o diabetes mellitus: multiple family member with type 2 diabetes. No type 1 diabetes.    No personal history of pancreatitis and / or family history of medullary thyroid  carcinoma or MEN 2B syndrome.   Chronic Diabetes Complications : Retinopathy: ? no. Last ophthalmology exam was done on 04/2024, reportedly, following with ophthalmology regularly.  No records available to review.  He reports he had diabetic eye exam first time this year. Nephropathy: no, on ACE/ARB /losartan  and Jardiance . Peripheral neuropathy: yes, numbness and tingling of the feet.   Coronary artery disease: no Stroke: no  Relevant comorbidities and cardiovascular risk factors: Obesity: yes Body mass index is 31.96 kg/m.  Hypertension: Yes  Hyperlipidemia : Yes, on statin   Current / Home Diabetic regimen includes:  Ozempic  2 mg weekly. Jardiance  25mg  daily. Glyburide  1/2 tab 1.5mg  daily.   Prior diabetic medications: Metfromin stomach pain,  was stopped several years ago around 2013.  Glycemic data:   No glucose data to review.  Hypoglycemia: Patient has no hypoglycemic episodes. Patient has hypoglycemia awareness.  Factors modifying glucose control: 1.  Diabetic diet assessment: 3 meals a day.  2.  Staying active or exercising:   3.  Medication compliance: compliant all of the time.  Interval history  Patient presented to establish endocrinology care for diabetes mellitus.  Recently diabetic control has been slowly improving with hemoglobin A1c was 7.1% last month.  Diabetes regimen as reviewed and noted above.  No glucose data to review.  REVIEW OF SYSTEMS As per history of present illness.   PAST MEDICAL HISTORY: Past Medical History:  Diagnosis Date   Alcohol abuse    Anxiety    Arthritis    Chronic neck pain    sees Dr. Frederic Don (Ortho). 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 Lauraine Pummel   Diabetes mellitus without complication (HCC)    Headache(784.0)    Hyperlipidemia    Hypertension    Obesity 10/24/2012    PAST SURGICAL HISTORY: Past Surgical History:  Procedure Laterality Date   LAPAROSCOPIC APPENDECTOMY N/A 07/31/2020   Procedure: APPENDECTOMY LAPAROSCOPIC;  Surgeon: Belinda Cough, MD;  Location: [facility] OR;  Service: General;  Laterality: N/A;   NECK SURGERY      ALLERGIES: Allergies  Allergen Reactions   Lisinopril  Cough   Metformin  And Related Other (See Comments)    Abdominal cramps     FAMILY HISTORY:  Family History  Problem Relation Age of Onset   Diabetes Father    Diabetes Maternal Grandmother    Diabetes Maternal Grandfather    Diabetes Paternal Grandmother    Diabetes Paternal Grandfather    Hypertension Other    Cancer Other        lung   Depression Other    Colon cancer Neg Hx    Colon polyps Neg Hx    Esophageal cancer Neg Hx    Rectal cancer Neg Hx    Stomach cancer Neg Hx     SOCIAL HISTORY: Social History    Socioeconomic History   Marital status: Single    Spouse name: Not on file   Number of children: 0   Years of education: Not on file   Highest education level: 12th grade  Occupational History   Not on file  Tobacco Use   Smoking status: Every Day    Current packs/day: 1.00    Types: Cigarettes   Smokeless tobacco: Never  Vaping Use   Vaping status: Never Used  Substance and Sexual Activity   Alcohol use: Yes    Comment: 4-5 beers once per week   Drug use: No   Sexual activity: Not on file  Other Topics Concern   Not on file  Social History Narrative   Not on file   Social Drivers of Health   Financial Resource Strain: Low Risk  (06/22/2023)   Overall Financial Resource Strain (CARDIA)    Difficulty of Paying Living Expenses: Not very hard  Food Insecurity: No Food Insecurity (06/22/2023)   Hunger Vital Sign    Worried About Running Out of Food in the Last Year: Never true    Ran Out of Food in the Last Year: Never true  Transportation Needs: No Transportation Needs (06/22/2023)   PRAPARE - Administrator, Civil Service (Medical): No    Lack of Transportation (Non-Medical): No  Physical Activity: Sufficiently Active (06/22/2023)   Exercise Vital Sign    Days of Exercise per Week: 3 days    Minutes of Exercise per Session: 90 min  Stress: Stress Concern Present (06/22/2023)   [employer] of Occupational Health - Occupational Stress Questionnaire    Feeling of Stress : To some extent  Social Connections: Socially Isolated (06/22/2023)   Social Connection and Isolation Panel    Frequency of Communication with Friends and Family: More than three times a week    Frequency of Social Gatherings with Friends and Family: More than three times a week    Attends Religious Services: Never    Database administrator or Organizations: No    Attends Engineer, structural: Not on file    Marital Status: Separated    MEDICATIONS:  Current Outpatient  Medications  Medication Sig Dispense Refill   acetaminophen  (TYLENOL ) 500 MG tablet Take 1-2 tablets (500-1,000 mg total) by mouth every 6 (six) hours as needed for mild pain. 30 tablet 0   amoxicillin -clavulanate (AUGMENTIN ) 875-125 MG tablet Take 1 tablet by mouth 2 (two) times daily. 20 tablet 1   atorvastatin  (LIPITOR) 20 MG tablet TAKE ONE TABLET BY MOUTH ONE TIME DAILY 90 tablet 0   DULoxetine  (CYMBALTA ) 30 MG capsule Take 2  capsules (60 mg total) by mouth 2 (two) times daily. 120 capsule 3   empagliflozin  (JARDIANCE ) 25 MG TABS tablet Take 1 tablet (25 mg total) by mouth daily before breakfast. 90 tablet 3   fish oil-omega-3 fatty acids 1000 MG capsule Take 2 g by mouth daily.     hydrOXYzine  (ATARAX ) 10 MG tablet Take 1-2 tablets (10-20 mg total) by mouth daily as needed (panic attacks). 60 tablet 2   losartan  (COZAAR ) 50 MG tablet TAKE ONE TABLET BY MOUTH ONE TIME DAILY 90 tablet 3   lubiprostone  (AMITIZA ) 8 MCG capsule Take 1 capsule (8 mcg total) by mouth 2 (two) times daily with a meal. 180 capsule 3   meloxicam (MOBIC) 15 MG tablet Take 1 tablet every day by oral route. (Patient taking differently: Takes as needed)     metFORMIN  (GLUCOPHAGE -XR) 500 MG 24 hr tablet Take 1 tab daily for 1 week and increase to 2 tabs daily. 180 tablet 4   omeprazole (PRILOSEC) 20 MG capsule Take 20 mg by mouth in the morning and at bedtime. Taking once a day but will increase to BID if needed.     Semaglutide , 2 MG/DOSE, (OZEMPIC , 2 MG/DOSE,) 8 MG/3ML SOPN Inject 2 mg into the skin once a week. 3 mL 5   tirzepatide (MOUNJARO) 10 MG/0.5ML Pen Inject 10 mg into the skin once a week. 2 mL 4   No current facility-administered medications for this visit.    PHYSICAL EXAM: Vitals:   06/15/24 1020 06/15/24 1021  BP: (!) 148/80 (!) 146/80  Pulse: 71   Resp: 20   SpO2: 99%   Weight: 219 lb 9.6 oz (99.6 kg)   Height: 5' 9.5 (1.765 m)    Body mass index is 31.96 kg/m.  Wt Readings from Last 3  Encounters:  06/15/24 219 lb 9.6 oz (99.6 kg)  12/30/23 221 lb (100.2 kg)  11/18/23 221 lb 12.8 oz (100.6 kg)    General: Well developed, well nourished male in no apparent distress.  HEENT: AT/[place], no external lesions.  Eyes: Conjunctiva clear and no icterus. Neck: Neck supple  Lungs: Respirations not labored Neurologic: Alert, oriented, normal speech Extremities / Skin: Dry.  Psychiatric: Does not appear depressed or anxious  Diabetic Foot Exam - Simple   Simple Foot Form Visual Inspection See comments: Yes Sensation Testing See comments: Yes Pulse Check See comments: Yes Comments Monofilament diminished mainly on toes bilaterally.Mild callus present. No ulcer. Flat soles.  DP palpable bilaterally.     LABS Reviewed Lab Results  Component Value Date   HGBA1C 7.1 (H) 05/14/2024   HGBA1C 7.5 (H) 02/02/2024   HGBA1C 7.8 (H) 11/03/2023   No results found for: FRUCTOSAMINE Lab Results  Component Value Date   CHOL 154 06/23/2023   HDL 45.00 06/23/2023   LDLCALC 81 06/23/2023   LDLDIRECT 92.0 06/19/2022   TRIG 140.0 06/23/2023   CHOLHDL 3 06/23/2023   No results found for: [facility] Lab Results  Component Value Date   CREATININE 0.69 06/23/2023   Lab Results  Component Value Date   GFR 111.32 06/23/2023    ASSESSMENT / PLAN  1. Type 2 diabetes mellitus with other specified complication, without long-term current use of insulin  (HCC)     Diabetes Mellitus type 2, complicated by diabetic neuropathy. - Diabetic status / severity: Fair control, improving.  Lab Results  Component Value Date   HGBA1C 7.1 (H) 05/14/2024    - Hemoglobin A1c goal : <6.5%  Discussed about type 2  diabetes mellitus and potential diabetic complications including diabetic retinopathy, neuropathy and nephropathy.  Reports she has been on Ozempic  for a long time and kind of plateaued for weight loss.  - Medications: See below.  I) stop Ozempic  and start Mounjaro 10 mg  weekly.  Will gradually increase the dose as tolerated. II) start metformin  extended release 500 mg 1 tablet daily for 1 week and increase to 2 tablets daily. III) stop glyburide . IV) continue Jardiance  25 mg daily.  Patient is asked to call our clinic after a month if no side effects will increase dose of Mounjaro.  - Home glucose testing: At least in the morning fasting and bring glucometer in the follow-up visit.  - Discussed/ Gave Hypoglycemia treatment plan.  # Consult : not required at this time.   # Annual urine for microalbuminuria/ creatinine ratio, no microalbuminuria currently, continue ACE/ARB /losartan  and Jardiance .  Will check urine microalbumin creatinine ratio along with BMP with eGFR today. Last No results found for: MICRALBCREAT  # Foot check nightly / neuropathy +, expect to improve some after improvement on diabetes control.  # Annual dilated diabetic eye exams.   - Diet: Make healthy diabetic food choices - Life style / activity / exercise: Discussed.  2. Blood pressure  -  BP Readings from Last 1 Encounters:  06/15/24 (!) 146/80    - Control is not in target.  Mildly elevated continue follow-up with primary care provider.  - No change in current plans.  3. Lipid status / Hyperlipidemia - Last  Lab Results  Component Value Date   LDLCALC 81 06/23/2023   - Continue atorvastatin  20 mg daily.  Will check lipid panel today.  Diagnoses and all orders for this visit:  Type 2 diabetes mellitus with other specified complication, without long-term current use of insulin  (HCC) -     Microalbumin / creatinine urine ratio -     Basic metabolic panel with GFR -     Lipid panel -     tirzepatide (MOUNJARO) 10 MG/0.5ML Pen; Inject 10 mg into the skin once a week.  Other orders -     metFORMIN  (GLUCOPHAGE -XR) 500 MG 24 hr tablet; Take 1 tab daily for 1 week and increase to 2 tabs daily.    DISPOSITION Follow up in clinic in 3  months suggested.  Labs today  as ordered.   All questions answered and patient verbalized understanding of the plan.  [name] Wahid Holley, MD [facility] [address] [place], [address] [place], [place] [address] Phone # [phone]  At least part of this note was generated using voice recognition software. Inadvertent word errors may have occurred, which were not recognized during the proofreading process.

## 2024-06-16 ENCOUNTER — Ambulatory Visit: Payer: Self-pay | Admitting: Endocrinology

## 2024-06-16 LAB — BASIC METABOLIC PANEL WITH GFR
BUN/Creatinine Ratio: 21 (calc) (ref 6–22)
BUN: 12 mg/dL (ref 7–25)
CO2: 25 mmol/L (ref 20–32)
Calcium: 9.1 mg/dL (ref 8.6–10.3)
Chloride: 103 mmol/L (ref 98–110)
Creat: 0.58 mg/dL — ABNORMAL LOW (ref 0.60–1.29)
Glucose, Bld: 75 mg/dL (ref 65–99)
Potassium: 4.1 mmol/L (ref 3.5–5.3)
Sodium: 138 mmol/L (ref 135–146)
eGFR: 121 mL/min/1.73m2 (ref >=60)

## 2024-06-16 LAB — MICROALBUMIN / CREATININE URINE RATIO
Creatinine, Urine: 76 mg/dL (ref 20–320)
Microalb Creat Ratio: 542 mg/g{creat} — ABNORMAL HIGH (ref <30)
Microalb, Ur: 41.2 mg/dL

## 2024-06-16 LAB — LIPID PANEL
Cholesterol: 146 mg/dL (ref <200)
HDL: 45 mg/dL (ref >=40)
LDL Cholesterol (Calc): 80 mg/dL
Non-HDL Cholesterol (Calc): 101 mg/dL (ref <130)
Total CHOL/HDL Ratio: 3.2 (calc) (ref <5.0)
Triglycerides: 114 mg/dL (ref <150)

## 2024-06-17 ENCOUNTER — Other Ambulatory Visit (HOSPITAL_COMMUNITY): Payer: Self-pay

## 2024-06-17 ENCOUNTER — Encounter: Payer: Self-pay | Admitting: Pharmacy Technician

## 2024-06-17 ENCOUNTER — Telehealth: Payer: Self-pay | Admitting: Pharmacy Technician

## 2024-06-17 NOTE — Telephone Encounter (Signed)
 As far as I know he has been only on Ozempic  in the past.

## 2024-06-17 NOTE — Telephone Encounter (Signed)
 Pharmacy Patient Advocate Encounter   Received notification from CoverMyMeds that prior authorization for Mounjaro 10MG /0.5ML auto-injectors  is required/requested.   Insurance verification completed.   The patient is insured through [employer]. Key: [id number]  Prior Authorization form/request asks a question that requires your assistance. Please see the question below and advise accordingly. The PA will not be submitted until the necessary information is received.   **Medicaid is requesting that the patient has tried at least 2 of the following below. Has he tried any of the others listed besides Ozempic ?**

## 2024-06-17 NOTE — Telephone Encounter (Signed)
  error

## 2024-06-18 NOTE — Telephone Encounter (Signed)
 Pharmacy Patient Advocate Encounter  Received notification from [employer] that Prior Authorization for Mounjaro 10MG /0.5ML auto-injectors  has been DENIED.  Full denial letter will be uploaded to the media tab. See denial reason below.  [employer] wants the patient to have had a trial of 2 preferred drugs.**    PA #/Case ID/Reference #: [id number]

## 2024-06-18 NOTE — Telephone Encounter (Signed)
 Clinical questions have been answered and PA submitted. PA currently Pending. Please be advised that most companies allow up to 30 days to make a decision. We will advise when a determination has been made, or follow up in 1 week.   Please reach out to our team, Rx Prior Auth Pool, if you haven't heard back in a week.

## 2024-06-21 ENCOUNTER — Other Ambulatory Visit: Payer: Self-pay

## 2024-06-21 MED ORDER — OZEMPIC (2 MG/DOSE) 8 MG/3ML ~~LOC~~ SOPN: 2.0000 mg | PEN_INJECTOR | SUBCUTANEOUS | 5 refills | Status: DC

## 2024-06-21 NOTE — Telephone Encounter (Signed)
 Since the [facility] is not covered I recommend to stay on current dose of Ozempic  2 mg weekly.

## 2024-07-11 ENCOUNTER — Other Ambulatory Visit: Payer: Self-pay | Admitting: Family Medicine

## 2024-07-15 DIAGNOSIS — M25571 Pain in right ankle and joints of right foot: Secondary | ICD-10-CM | POA: Diagnosis not present

## 2024-07-15 DIAGNOSIS — E119 Type 2 diabetes mellitus without complications: Secondary | ICD-10-CM | POA: Diagnosis not present

## 2024-07-29 DIAGNOSIS — S92251A Displaced fracture of navicular [scaphoid] of right foot, initial encounter for closed fracture: Secondary | ICD-10-CM | POA: Diagnosis not present

## 2024-07-29 DIAGNOSIS — M24571 Contracture, right ankle: Secondary | ICD-10-CM | POA: Diagnosis not present

## 2024-07-29 DIAGNOSIS — E1165 Type 2 diabetes mellitus with hyperglycemia: Secondary | ICD-10-CM | POA: Diagnosis not present

## 2024-07-29 DIAGNOSIS — M216X1 Other acquired deformities of right foot: Secondary | ICD-10-CM | POA: Diagnosis not present

## 2024-07-29 DIAGNOSIS — M2141 Flat foot [pes planus] (acquired), right foot: Secondary | ICD-10-CM | POA: Diagnosis not present

## 2024-07-29 DIAGNOSIS — G8929 Other chronic pain: Secondary | ICD-10-CM | POA: Diagnosis not present

## 2024-07-29 DIAGNOSIS — M21071 Valgus deformity, not elsewhere classified, right ankle: Secondary | ICD-10-CM | POA: Diagnosis not present

## 2024-07-29 DIAGNOSIS — M21072 Valgus deformity, not elsewhere classified, left ankle: Secondary | ICD-10-CM | POA: Diagnosis not present

## 2024-07-29 DIAGNOSIS — M76821 Posterior tibial tendinitis, right leg: Secondary | ICD-10-CM | POA: Diagnosis not present

## 2024-07-29 DIAGNOSIS — M25571 Pain in right ankle and joints of right foot: Secondary | ICD-10-CM | POA: Diagnosis not present

## 2024-07-29 DIAGNOSIS — M19071 Primary osteoarthritis, right ankle and foot: Secondary | ICD-10-CM | POA: Diagnosis not present

## 2024-07-29 DIAGNOSIS — E1142 Type 2 diabetes mellitus with diabetic polyneuropathy: Secondary | ICD-10-CM | POA: Diagnosis not present

## 2024-08-06 ENCOUNTER — Other Ambulatory Visit: Payer: Self-pay | Admitting: Family Medicine

## 2024-08-13 ENCOUNTER — Telehealth (HOSPITAL_COMMUNITY): Admitting: Physician Assistant

## 2024-08-24 ENCOUNTER — Telehealth (HOSPITAL_COMMUNITY): Admitting: Physician Assistant

## 2024-08-24 ENCOUNTER — Encounter (HOSPITAL_COMMUNITY): Payer: Self-pay | Admitting: Physician Assistant

## 2024-08-24 DIAGNOSIS — F3340 Major depressive disorder, recurrent, in remission, unspecified: Secondary | ICD-10-CM | POA: Diagnosis not present

## 2024-08-24 DIAGNOSIS — F410 Panic disorder [episodic paroxysmal anxiety] without agoraphobia: Secondary | ICD-10-CM | POA: Diagnosis not present

## 2024-08-24 DIAGNOSIS — F411 Generalized anxiety disorder: Secondary | ICD-10-CM

## 2024-08-24 MED ORDER — DULOXETINE HCL 30 MG PO CPEP: 60.0000 mg | ORAL_CAPSULE | Freq: Two times a day (BID) | ORAL | 3 refills | Status: AC

## 2024-08-24 MED ORDER — HYDROXYZINE HCL 10 MG PO TABS: 10.0000 mg | ORAL_TABLET | Freq: Every day | ORAL | 2 refills | Status: AC | PRN
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
## 2024-09-15 ENCOUNTER — Encounter: Payer: Self-pay | Admitting: Endocrinology

## 2024-09-15 ENCOUNTER — Ambulatory Visit: Admitting: Endocrinology

## 2024-09-15 ENCOUNTER — Ambulatory Visit: Payer: Self-pay | Admitting: Endocrinology

## 2024-09-15 VITALS — BP 148/82 | HR 105 | Resp 16 | Ht 69.5 in | Wt 214.8 lb

## 2024-09-15 DIAGNOSIS — Z7984 Long term (current) use of oral hypoglycemic drugs: Secondary | ICD-10-CM | POA: Diagnosis not present

## 2024-09-15 DIAGNOSIS — Z7985 Long-term (current) use of injectable non-insulin antidiabetic drugs: Secondary | ICD-10-CM

## 2024-09-15 DIAGNOSIS — E1169 Type 2 diabetes mellitus with other specified complication: Secondary | ICD-10-CM | POA: Diagnosis not present

## 2024-09-15 LAB — POCT GLYCOSYLATED HEMOGLOBIN (HGB A1C): Hemoglobin A1C: 6 % — AB (ref 4.0–5.6)

## 2024-09-15 MED ORDER — EMPAGLIFLOZIN 25 MG PO TABS: 25.0000 mg | ORAL_TABLET | Freq: Every day | ORAL | 3 refills | Status: AC

## 2024-09-15 MED ORDER — METFORMIN HCL ER 500 MG PO TB24: ORAL_TABLET | ORAL | 4 refills | Status: AC

## 2024-09-15 MED ORDER — OZEMPIC (2 MG/DOSE) 8 MG/3ML ~~LOC~~ SOPN: 2.0000 mg | PEN_INJECTOR | SUBCUTANEOUS | 3 refills | Status: AC

## 2024-09-15 MED ORDER — GLIPIZIDE ER 2.5 MG PO TB24: 2.5000 mg | ORAL_TABLET | Freq: Every day | ORAL | 3 refills | Status: AC

## 2024-09-15 NOTE — Progress Notes (Signed)
 "  Outpatient Endocrinology Note Jakelin Taussig, MD   Patient's Name: [name] [name]    DOB: Apr 05, 1976    MRN: [id number]                                                    REASON OF VISIT: Follow-up for type 2 diabetes mellitus  REFERRING PROVIDER: Johnny Garnette LABOR, MD  PCP: Johnny Garnette LABOR, MD  HISTORY OF PRESENT ILLNESS:   [name] [name] is a 49 y.o. old male with past medical history listed below, is here for follow-up for type 2 diabetes mellitus.   Pertinent Diabetes History: Patient was referred to endocrinology for evaluation and management of uncontrolled type 2 diabetes mellitus.  Initial consult on June 15, 2024.  Patient was diagnosed with type 2 diabetes mellitus in 2011.  He has hemoglobin A1c in the range of 7 to 10% range in the past lately diabetes control has been slowly improving.  He has not been on insulin  therapy in the past.  History of DKA or diabetes related hospitalizations: none  Previous diabetes education: Yes   Family h/o diabetes mellitus: multiple family member with type 2 diabetes. No type 1 diabetes.    No personal history of pancreatitis and / or family history of medullary thyroid  carcinoma or MEN 2B syndrome.   Chronic Diabetes Complications : Retinopathy: ? no. Last ophthalmology exam was done on 04/2024, reportedly, following with ophthalmology regularly.  No records available to review.  He reports he had diabetic eye exam first time this year. Nephropathy: no, on ACE/ARB /losartan  and Jardiance . Peripheral neuropathy: yes, numbness and tingling of the feet.   Coronary artery disease: no Stroke: no  Relevant comorbidities and cardiovascular risk factors: Obesity: yes Body mass index is 31.27 kg/m.  Hypertension: Yes  Hyperlipidemia : Yes, on statin   Current / Home Diabetic regimen includes:  Metformin  XR 500 mg 1 tab with supper.  Ozempic  2 mg weekly. Jardiance  25 mg daily. Glyburide  1/2 tab 1.5 mg daily.   Prior diabetic  medications: Metfromin stomach pain, was stopped several years ago around 2013.  Glyburide  is stopped in initial visit October 2025.  Dizziness with higher dose of metformin . Mounjaro  was planned denied by insurance.  Glycemic data:   No glucose data to review.  Reports his blood sugar 89, 125, 94.  He has been checking blood sugar occasionally at home. 89, 125, 94  Hypoglycemia: Patient has no hypoglycemic episodes. Patient has hypoglycemia awareness.  Factors modifying glucose control: 1.  Diabetic diet assessment: 3 meals a day.  2.  Staying active or exercising:   3.  Medication compliance: compliant all of the time.  Interval history  Hemoglobin A1c 6%.  Patient reports dizziness on taking 2 tablets of metformin  and has only been taking 1 tablet with supper.  He restarted glyburide  leftover half tablet daily.  He has also been taking Ozempic  2 mg weekly, tolerating well.  He lost 5 to 6 pounds of weight in last few months.  He reports his last few months has been stressful.  He has numbness in the right foot.  Otherwise no complaints today.   REVIEW OF SYSTEMS As per history of present illness.   PAST MEDICAL HISTORY: Past Medical History:  Diagnosis Date   Alcohol abuse    Anxiety    Arthritis  Chronic neck pain    sees Dr. Frederic Don (Ortho). 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 Lauraine Pummel   Diabetes mellitus without complication (HCC)    Headache(784.0)    Hyperlipidemia    Hypertension    Obesity 10/24/2012    PAST SURGICAL HISTORY: Past Surgical History:  Procedure Laterality Date   LAPAROSCOPIC APPENDECTOMY N/A 07/31/2020   Procedure: APPENDECTOMY LAPAROSCOPIC;  Surgeon: Belinda Cough, MD;  Location: [facility] OR;  Service: General;  Laterality: N/A;   NECK SURGERY      ALLERGIES: Allergies  Allergen Reactions   Lisinopril  Cough   Metformin  And Related Other (See Comments)    Abdominal cramps      FAMILY HISTORY:  Family History  Problem Relation Age of Onset   Diabetes Father    Diabetes Maternal Grandmother    Diabetes Maternal Grandfather    Diabetes Paternal Grandmother    Diabetes Paternal Grandfather    Hypertension Other    Cancer Other        lung   Depression Other    Colon cancer Neg Hx    Colon polyps Neg Hx    Esophageal cancer Neg Hx    Rectal cancer Neg Hx    Stomach cancer Neg Hx     SOCIAL HISTORY: Social History   Socioeconomic History   Marital status: Single    Spouse name: Not on file   Number of children: 0   Years of education: Not on file   Highest education level: 12th grade  Occupational History   Not on file  Tobacco Use   Smoking status: Every Day    Current packs/day: 1.00    Types: Cigarettes   Smokeless tobacco: Never  Vaping Use   Vaping status: Never Used  Substance and Sexual Activity   Alcohol use: Yes    Comment: 4-5 beers once per week   Drug use: No   Sexual activity: Not on file  Other Topics Concern   Not on file  Social History Narrative   Not on file   Social Drivers of Health   Tobacco Use: High Risk (09/15/2024)   Patient History    Smoking Tobacco Use: Every Day    Smokeless Tobacco Use: Never    Passive Exposure: Not on file  Financial Resource Strain: Low Risk (06/22/2023)   Overall Financial Resource Strain (CARDIA)    Difficulty of Paying Living Expenses: Not very hard  Food Insecurity: No Food Insecurity (06/22/2023)   Hunger Vital Sign    Worried About Running Out of Food in the Last Year: Never true    Ran Out of Food in the Last Year: Never true  Transportation Needs: No Transportation Needs (06/22/2023)   PRAPARE - Administrator, Civil Service (Medical): No    Lack of Transportation (Non-Medical): No  Physical Activity: Sufficiently Active (06/22/2023)   Exercise Vital Sign    Days of Exercise per Week: 3 days    Minutes of Exercise per Session: 90 min  Stress: Stress  Concern Present (06/22/2023)   [employer] of Occupational Health - Occupational Stress Questionnaire    Feeling of Stress : To some extent  Social Connections: Socially Isolated (06/22/2023)   Social Connection and Isolation Panel    Frequency of Communication with Friends and Family: More than three times a week    Frequency of Social Gatherings with Friends and Family: More than  three times a week    Attends Religious Services: Never    Active Member of Clubs or Organizations: No    Attends Banker Meetings: Not on file    Marital Status: Separated  Depression (PHQ2-9): Low Risk (08/24/2024)   Depression (PHQ2-9)    PHQ-2 Score: 0  Alcohol Screen: Low Risk (06/22/2023)   Alcohol Screen    Last Alcohol Screening Score (AUDIT): 6  Housing: Low Risk (06/22/2023)   Housing    Last Housing Risk Score: 0  Utilities: Not on file  Health Literacy: Not on file    MEDICATIONS:  Current Outpatient Medications  Medication Sig Dispense Refill   acetaminophen  (TYLENOL ) 500 MG tablet Take 1-2 tablets (500-1,000 mg total) by mouth every 6 (six) hours as needed for mild pain. 30 tablet 0   amoxicillin -clavulanate (AUGMENTIN ) 875-125 MG tablet Take 1 tablet by mouth 2 (two) times daily. 20 tablet 1   atorvastatin  (LIPITOR) 20 MG tablet TAKE ONE TABLET BY MOUTH ONE TIME DAILY 90 tablet 0   DULoxetine  (CYMBALTA ) 30 MG capsule Take 2 capsules (60 mg total) by mouth 2 (two) times daily. 120 capsule 3   fish oil-omega-3 fatty acids 1000 MG capsule Take 2 g by mouth daily.     glipiZIDE  (GLUCOTROL  XL) 2.5 MG 24 hr tablet Take 1 tablet (2.5 mg total) by mouth daily with breakfast. 90 tablet 3   hydrOXYzine  (ATARAX ) 10 MG tablet Take 1-2 tablets (10-20 mg total) by mouth daily as needed (panic attacks). 60 tablet 2   losartan  (COZAAR ) 50 MG tablet TAKE ONE TABLET BY MOUTH ONE TIME DAILY 90 tablet 3   lubiprostone  (AMITIZA ) 8 MCG capsule Take 1 capsule (8 mcg total) by mouth 2 (two)  times daily with a meal. 180 capsule 3   meloxicam (MOBIC) 15 MG tablet Take 1 tablet every day by oral route. (Patient taking differently: Takes as needed)     omeprazole (PRILOSEC) 20 MG capsule Take 20 mg by mouth in the morning and at bedtime. Taking once a day but will increase to BID if needed.     empagliflozin  (JARDIANCE ) 25 MG TABS tablet Take 1 tablet (25 mg total) by mouth daily before breakfast. 90 tablet 3   metFORMIN  (GLUCOPHAGE -XR) 500 MG 24 hr tablet Patient taking only 1 tablet per patient 90 tablet 4   Semaglutide , 2 MG/DOSE, (OZEMPIC , 2 MG/DOSE,) 8 MG/3ML SOPN Inject 2 mg into the skin once a week. 9 mL 3   No current facility-administered medications for this visit.    PHYSICAL EXAM: Vitals:   09/15/24 0834  BP: (!) 148/82  Pulse: (!) 105  Resp: 16  SpO2: 98%  Weight: 214 lb 12.8 oz (97.4 kg)  Height: 5' 9.5 (1.765 m)    Body mass index is 31.27 kg/m.  Wt Readings from Last 3 Encounters:  09/15/24 214 lb 12.8 oz (97.4 kg)  06/15/24 219 lb 9.6 oz (99.6 kg)  12/30/23 221 lb (100.2 kg)    General: Well developed, well nourished male in no apparent distress.  HEENT: AT/[place], no external lesions.  Eyes: Conjunctiva clear and no icterus. Neck: Neck supple  Lungs: Respirations not labored Neurologic: Alert, oriented, normal speech Extremities / Skin: Dry.  Psychiatric: Does not appear depressed or anxious  Diabetic Foot Exam - Simple   Simple Foot Form Visual Inspection See comments: Yes Sensation Testing See comments: Yes Pulse Check Posterior Tibialis and Dorsalis pulse intact bilaterally: Yes Comments B/l flat soles, R > L. Diminished  monofilament exam on right toes and intact on right sole and left foot.     LABS Reviewed Lab Results  Component Value Date   HGBA1C 6.0 (A) 09/15/2024   HGBA1C 7.1 (H) 05/14/2024   HGBA1C 7.5 (H) 02/02/2024   No results found for: FRUCTOSAMINE Lab Results  Component Value Date   CHOL 146 06/15/2024   HDL 45  06/15/2024   LDLCALC 80 06/15/2024   LDLDIRECT 92.0 06/19/2022   TRIG 114 06/15/2024   CHOLHDL 3.2 06/15/2024   Lab Results  Component Value Date   MICRALBCREAT 542 (H) 06/15/2024   Lab Results  Component Value Date   CREATININE 0.58 (L) 06/15/2024   Lab Results  Component Value Date   GFR 111.32 06/23/2023    ASSESSMENT / PLAN  1. Type 2 diabetes mellitus with other specified complication, without long-term current use of insulin  (HCC)    Diabetes Mellitus type 2, complicated by diabetic neuropathy. - Diabetic status / severity: Controlled.  Lab Results  Component Value Date   HGBA1C 6.0 (A) 09/15/2024    - Hemoglobin A1c goal : <6.5%  - MEDICATIONS: See below.  I) continue Ozempic  2 mg weekly.  He has been losing weight and also diabetes is well-controlled, will stay on Ozempic  for now. II) continue metformin  extended release 500 mg 1 tablet daily III) I asked that we stop glyburide .  Switch to glipizide  extended release 2.5 mg daily.   IV) continue Jardiance  25 mg daily.  - Home glucose testing: At least in the morning fasting and bring glucometer in the follow-up visit.    - Discussed/ Gave Hypoglycemia treatment plan.  # Consult : not required at this time.   # Annual urine for microalbuminuria/ creatinine ratio, + microalbuminuria currently, continue ACE/ARB / losartan  and Jardiance .  Expect to improve with improvement on diabetes control.  Will recheck in next follow-up visit.   Last  Lab Results  Component Value Date   MICRALBCREAT 542 (H) 06/15/2024    # Foot check nightly / neuropathy +, expect to improve some after improvement on diabetes control.  # Annual dilated diabetic eye exams.   - Diet: Make healthy diabetic food choices - Life style / activity / exercise: Discussed.  2. Blood pressure  -  BP Readings from Last 1 Encounters:  09/15/24 (!) 148/82    - Control is not in target.  Mildly elevated continue follow-up with primary care  provider.  - No change in current plans.  3. Lipid status / Hyperlipidemia - Last  Lab Results  Component Value Date   LDLCALC 80 06/15/2024   - Continue atorvastatin  20 mg daily.    Diagnoses and all orders for this visit:  Type 2 diabetes mellitus with other specified complication, without long-term current use of insulin  (HCC) -     POCT glycosylated hemoglobin (Hb A1C) -     metFORMIN  (GLUCOPHAGE -XR) 500 MG 24 hr tablet; Patient taking only 1 tablet per patient -     glipiZIDE  (GLUCOTROL  XL) 2.5 MG 24 hr tablet; Take 1 tablet (2.5 mg total) by mouth daily with breakfast. -     empagliflozin  (JARDIANCE ) 25 MG TABS tablet; Take 1 tablet (25 mg total) by mouth daily before breakfast. -     Semaglutide , 2 MG/DOSE, (OZEMPIC , 2 MG/DOSE,) 8 MG/3ML SOPN; Inject 2 mg into the skin once a week.   DISPOSITION Follow up in clinic in 4  months suggested.    All questions answered and patient verbalized understanding of  the plan.  Killian Ress, MD [facility] Endocrinology [facility] [address] [place], [address] [place], [place] [address] Phone # [phone]  At least part of this note was generated using voice recognition software. Inadvertent word errors may have occurred, which were not recognized during the proofreading process. "

## 2024-09-15 NOTE — Patient Instructions (Addendum)
"   Latest Reference Range & Units 02/02/24 08:24 05/14/24 08:33 09/15/24 08:36  Hemoglobin A1C 4.0 - 5.6 % 7.5 (H) 7.1 (H) 6.0 !  (H): Data is abnormally high !: Data is abnormal  Ozempic  2mg  weekly. Metfomin XR 500 mg 1 tab daily. Switch to glipizide  XR 2.5mg  daily.  "

## 2024-11-23 ENCOUNTER — Telehealth (HOSPITAL_COMMUNITY): Admitting: Physician Assistant

## 2025-01-13 ENCOUNTER — Ambulatory Visit: Admitting: Endocrinology
